# Patient Record
Sex: Female | Born: 1950 | Race: White | Hispanic: No | Marital: Single | Smoking: Never smoker
Health system: Southern US, Community
[De-identification: ages and names within clinical notes are randomized; demographics above are authoritative.]

## PROBLEM LIST (undated history)

## (undated) DIAGNOSIS — R569 Unspecified convulsions: Secondary | ICD-10-CM

## (undated) DIAGNOSIS — F09 Unspecified mental disorder due to known physiological condition: Secondary | ICD-10-CM

## (undated) DIAGNOSIS — Z803 Family history of malignant neoplasm of breast: Secondary | ICD-10-CM

## (undated) DIAGNOSIS — G039 Meningitis, unspecified: Secondary | ICD-10-CM

## (undated) DIAGNOSIS — E669 Obesity, unspecified: Secondary | ICD-10-CM

## (undated) DIAGNOSIS — E7800 Pure hypercholesterolemia, unspecified: Secondary | ICD-10-CM

## (undated) DIAGNOSIS — C50919 Malignant neoplasm of unspecified site of unspecified female breast: Secondary | ICD-10-CM

## (undated) DIAGNOSIS — Z9221 Personal history of antineoplastic chemotherapy: Secondary | ICD-10-CM

## (undated) DIAGNOSIS — I10 Essential (primary) hypertension: Secondary | ICD-10-CM

## (undated) DIAGNOSIS — E119 Type 2 diabetes mellitus without complications: Secondary | ICD-10-CM

## (undated) DIAGNOSIS — N3281 Overactive bladder: Secondary | ICD-10-CM

## (undated) DIAGNOSIS — Z923 Personal history of irradiation: Secondary | ICD-10-CM

## (undated) HISTORY — DX: Type 2 diabetes mellitus without complications: E11.9

## (undated) HISTORY — PX: CHOLECYSTECTOMY: SHX55

## (undated) HISTORY — DX: Unspecified convulsions: R56.9

## (undated) HISTORY — DX: Family history of malignant neoplasm of breast: Z80.3

## (undated) HISTORY — DX: Malignant neoplasm of unspecified site of unspecified female breast: C50.919

## (undated) HISTORY — PX: ABDOMINAL HYSTERECTOMY: SHX81

## (undated) HISTORY — DX: Pure hypercholesterolemia, unspecified: E78.00

## (undated) HISTORY — PX: BREAST BIOPSY: SHX20

## (undated) HISTORY — PX: BUNIONECTOMY: SHX129

---

## 2005-11-23 ENCOUNTER — Ambulatory Visit: Payer: Self-pay | Admitting: Gastroenterology

## 2006-09-16 ENCOUNTER — Ambulatory Visit: Payer: Self-pay | Admitting: Surgery

## 2006-09-23 ENCOUNTER — Ambulatory Visit: Payer: Self-pay | Admitting: Surgery

## 2007-12-29 ENCOUNTER — Ambulatory Visit: Payer: Self-pay | Admitting: Family Medicine

## 2008-03-05 ENCOUNTER — Ambulatory Visit: Payer: Self-pay | Admitting: Family Medicine

## 2008-03-19 ENCOUNTER — Ambulatory Visit: Payer: Self-pay | Admitting: Family Medicine

## 2008-04-19 ENCOUNTER — Ambulatory Visit: Payer: Self-pay | Admitting: Family Medicine

## 2008-05-19 ENCOUNTER — Ambulatory Visit: Payer: Self-pay | Admitting: Family Medicine

## 2009-03-15 ENCOUNTER — Ambulatory Visit: Payer: Self-pay | Admitting: Family Medicine

## 2010-03-17 ENCOUNTER — Ambulatory Visit: Payer: Self-pay | Admitting: Family Medicine

## 2010-11-16 ENCOUNTER — Emergency Department: Payer: Self-pay | Admitting: Internal Medicine

## 2011-04-21 ENCOUNTER — Ambulatory Visit: Payer: Self-pay | Admitting: Internal Medicine

## 2011-12-22 ENCOUNTER — Emergency Department: Payer: Self-pay | Admitting: Emergency Medicine

## 2011-12-22 LAB — CBC
HCT: 38.4 % (ref 35.0–47.0)
HGB: 12.8 g/dL (ref 12.0–16.0)
MCH: 30.3 pg (ref 26.0–34.0)
MCHC: 33.5 g/dL (ref 32.0–36.0)
MCV: 91 fL (ref 80–100)
RBC: 4.24 10*6/uL (ref 3.80–5.20)
RDW: 12.8 % (ref 11.5–14.5)

## 2011-12-22 LAB — COMPREHENSIVE METABOLIC PANEL
Albumin: 3.8 g/dL (ref 3.4–5.0)
Alkaline Phosphatase: 83 U/L (ref 50–136)
BUN: 18 mg/dL (ref 7–18)
Bilirubin,Total: 0.2 mg/dL (ref 0.2–1.0)
Calcium, Total: 9.1 mg/dL (ref 8.5–10.1)
Co2: 29 mmol/L (ref 21–32)
Creatinine: 0.81 mg/dL (ref 0.60–1.30)
Glucose: 100 mg/dL — ABNORMAL HIGH (ref 65–99)
Osmolality: 280 (ref 275–301)
Total Protein: 7.6 g/dL (ref 6.4–8.2)

## 2011-12-22 LAB — CK TOTAL AND CKMB (NOT AT ARMC)
CK, Total: 203 U/L (ref 21–215)
CK-MB: 2.7 ng/mL (ref 0.5–3.6)

## 2012-07-29 ENCOUNTER — Ambulatory Visit: Payer: Self-pay | Admitting: Internal Medicine

## 2012-10-03 ENCOUNTER — Encounter: Payer: Self-pay | Admitting: Nurse Practitioner

## 2012-10-04 ENCOUNTER — Encounter: Payer: Self-pay | Admitting: Nurse Practitioner

## 2012-10-04 ENCOUNTER — Ambulatory Visit (INDEPENDENT_AMBULATORY_CARE_PROVIDER_SITE_OTHER): Payer: Medicare Other | Admitting: Nurse Practitioner

## 2012-10-04 VITALS — BP 137/88 | HR 93 | Temp 99.7°F | Wt 203.0 lb

## 2012-10-04 DIAGNOSIS — G40309 Generalized idiopathic epilepsy and epileptic syndromes, not intractable, without status epilepticus: Secondary | ICD-10-CM

## 2012-10-04 DIAGNOSIS — Z79899 Other long term (current) drug therapy: Secondary | ICD-10-CM

## 2012-10-04 DIAGNOSIS — G40909 Epilepsy, unspecified, not intractable, without status epilepticus: Secondary | ICD-10-CM

## 2012-10-04 MED ORDER — CARBAMAZEPINE 200 MG PO TABS: 200.0000 mg | ORAL_TABLET | Freq: Three times a day (TID) | ORAL | Status: DC

## 2012-10-04 NOTE — Patient Instructions (Addendum)
Per group home instruction sheet

## 2012-10-04 NOTE — Progress Notes (Signed)
[facility]  PATIENT: Tamara Mejia DOB: December 11, 1950   REASON FOR VISIT: Followup for seizure disorder   HISTORY OF PRESENT ILLNESS: Tamara Mejia  a 62 year old, right handed, Caucasian female who is a resident in a group home,  follows up for seizure disorder.   She has past medical history of epilepsy. 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 She is independent in her ADL's but needs supervision and direction. She has generalized tonic-clonic seizures. She sometimes has an aura. She had breakthrough seizures approximately 11 years ago when she missed taking some of her Tegretol during a vacation with her family.  There was no recurrent seizure as long as she is taking her carbamazepine 200mg  tid.  She has had an EEG which showed frequent appearance of short lasting bilateral frontal predominant semirhythmic delta activity with large amplitude, indicating bilateral cerebral dysfunction, mainly frontal regions. There is no evidence of epileptiform discharge.  The EEG findings can due to mental retardation or metabolic toxic reasons.   UPDATE" 10/04/12: No seizure activity in many years. No behavior issues, lives in group home. No new neurologic complaints. She is with c/g today.    Review of Systems  Out of a complete 14 system review, the patient complains of only the following symptoms, and all other reviewed systems are negative.    REVIEW OF SYSTEMS: Full 14 system review of systems performed and notable only for:  Constitutional: N/A  Cardiovascular: N/A  Ear/Nose/Throat: N/A  Skin: N/A  Eyes: N/A  Respiratory: N/A  Gastroitestinal: N/A  Hematology/Lymphatic: N/A  Endocrine: N/A Musculoskeletal:N/A  Allergy/Immunology: N/A  Neurological: N/A Psychiatric: N/A   ALLERGIES: No Known Allergies  HOME MEDICATIONS: Outpatient Prescriptions Prior to Visit  Medication Sig Dispense Refill  . Calcium  Carbonate-Vitamin D (OS-CAL 500 + D PO) Take by mouth 2 (two) times daily.      . carbamazepine (TEGRETOL) 200 MG tablet Take 200 mg by mouth 3 (three) times daily.      . Cholecalciferol (VITAMIN D-3) 1000 UNITS CAPS Take 1,000 Units by mouth 2 (two) times daily.      . fenofibrate 160 MG tablet Take 160 mg by mouth daily.      Marland Kitchen lisinopril-hydrochlorothiazide (PRINZIDE,ZESTORETIC) 10-12.5 MG per tablet Take 1 tablet by mouth daily.      . Omega-3 Fatty Acids (PA FISH OIL PO) Take by mouth.      Marland Kitchen omeprazole (PRILOSEC) 20 MG capsule Take 20 mg by mouth daily.      . ranitidine (ZANTAC) 150 MG tablet Take 150 mg by mouth daily.      . simvastatin (ZOCOR) 20 MG tablet Take 20 mg by mouth at bedtime.      . metFORMIN (GLUCOPHAGE) 500 MG tablet Take 500 mg by mouth 2 (two) times daily with a meal.      . tolterodine (DETROL LA) 4 MG 24 hr capsule Take 4 mg by mouth daily.       No facility-administered medications prior to visit.    PAST MEDICAL HISTORY: Past Medical History  Diagnosis Date  . Diabetes   . Seizure   . High cholesterol     PAST SURGICAL HISTORY: Past Surgical History  Procedure Laterality Date  . Cholecystectomy      FAMILY HISTORY: Family History  Problem Relation Age of Onset  . Diabetes Father   . Heart disease Brother   . Heart disease Father     SOCIAL  HISTORY: History   Social History  . Marital Status: Single    Spouse Name: N/A    Number of Children: N/A  . Years of Education: N/A   Occupational History  . Not on file.   Social History Main Topics  . Smoking status: Never Smoker   . Smokeless tobacco: Never Used  . Alcohol Use: No  . Drug Use: No  . Sexual Activity: Not on file   Other Topics Concern  . Not on file   Social History Narrative   Patient works for cutting [employer]   Patient has her adult education certificate   Patient drinks about 2 cups of coffee daily.            PHYSICAL EXAM  Filed Vitals:   10/04/12 0937    BP: 137/88  Pulse: 93  Temp: 99.7 F (37.6 C)  TempSrc: Oral  Weight: 203 lb (92.08 kg)   Body mass index is 35.39 kg/(m^2).  Generalized: Well developed, obese female in no acute distress  Head: normocephalic and atraumatic,. Oropharynx benign  Neck: Supple, no carotid bruits  Cardiac: Regular rate rhythm, no murmur   Neurological examination   Mentation: Alert oriented to time, place, history taking. Follows all commands.  Cranial nerve II-XII: Pupils were equal round reactive to light, extraocular movements were full, visual field were full on confrontational test. Facial sensation and strength were normal. hearing was intact to finger rubbing bilaterally. Uvula tongue midline. head turning and shoulder shrug and were normal and symmetric.Tongue protrusion into cheek strength was normal. Motor: normal bulk and tone, full strength in the BUE, BLE, fine finger movements normal, no pronator drift. No focal weakness Sensory: normal and symmetric to light touch, pinprick, and  vibration  Coordination: finger-nose-finger, heel-to-shin bilaterally, no dysmetria Reflexes: Brachioradialis 2/2, biceps 2/2, triceps 2/2, patellar 2/2, Achilles 2/2, plantar responses were flexor bilaterally. Gait and Station: Rising up from seated position without assistance, normal stance,  moderate stride, good arm swing, smooth turning, able to perform tiptoe, and heel walking without difficulty. Tandem gait stable  DIAGNOSTIC DATA (LABS, IMAGING, TESTING)   none to review  ASSESSMENT AND PLAN  62 y.o. year old female  has a past medical history of Diabetes; Seizure; and High cholesterol. here to followup for seizure disorder. No seizures in many years. Currently on Tegretol 3 times a day without side effects.  Renew meds Will check CBC carbamazepine, and CMP today Followup yearly when necessary Nilda Riggs, [facility], [facility], APRN  [facility] [address], [address] [place],  [place] [address] [phone]

## 2012-10-05 LAB — COMPREHENSIVE METABOLIC PANEL
ALT: 19 IU/L (ref 0–32)
BUN: 14 mg/dL (ref 8–27)
Calcium: 9.4 mg/dL (ref 8.6–10.2)
Chloride: 101 mmol/L (ref 96–108)
GFR calc non Af Amer: 98 mL/min/{1.73_m2} (ref >59)
Glucose: 96 mg/dL (ref 65–99)
Potassium: 3.9 mmol/L (ref 3.5–5.2)
Total Protein: 6.8 g/dL (ref 6.0–8.5)

## 2012-10-05 LAB — CBC WITH DIFFERENTIAL/PLATELET
Basophils Absolute: 0 10*3/uL (ref 0.0–0.2)
HCT: 36.2 % (ref 34.0–46.6)
Lymphocytes Absolute: 1.7 10*3/uL (ref 0.7–3.1)
MCHC: 34.3 g/dL (ref 31.5–35.7)
Monocytes Absolute: 0.5 10*3/uL (ref 0.1–0.9)
Monocytes: 11 %
Neutrophils Relative %: 51 %
RDW: 13.4 % (ref 12.3–15.4)

## 2012-10-05 LAB — CARBAMAZEPINE LEVEL, TOTAL: Carbamazepine Lvl: 7 ug/mL (ref 4.0–12.0)

## 2012-10-05 NOTE — Progress Notes (Signed)
Quick Note:  I called and spoke with [facility] (group [facility]), Clarice Pole was given the results and will relay information. She asked that information be given to them, relating to appts and such as pt will not always relay. Coordinator to call about this. Would send to MR, for ROI. ______

## 2013-07-01 DIAGNOSIS — E119 Type 2 diabetes mellitus without complications: Secondary | ICD-10-CM | POA: Insufficient documentation

## 2013-07-01 DIAGNOSIS — E785 Hyperlipidemia, unspecified: Secondary | ICD-10-CM | POA: Insufficient documentation

## 2013-07-01 DIAGNOSIS — N3281 Overactive bladder: Secondary | ICD-10-CM | POA: Insufficient documentation

## 2013-07-01 DIAGNOSIS — I10 Essential (primary) hypertension: Secondary | ICD-10-CM | POA: Insufficient documentation

## 2013-07-01 DIAGNOSIS — R569 Unspecified convulsions: Secondary | ICD-10-CM | POA: Insufficient documentation

## 2013-08-07 ENCOUNTER — Ambulatory Visit: Payer: Self-pay | Admitting: Internal Medicine

## 2013-09-28 ENCOUNTER — Other Ambulatory Visit: Payer: Self-pay

## 2013-09-28 MED ORDER — CARBAMAZEPINE 200 MG PO TABS
200.0000 mg | ORAL_TABLET | Freq: Three times a day (TID) | ORAL | Status: DC
Start: 2013-09-28 — End: 2013-10-04

## 2013-10-04 ENCOUNTER — Ambulatory Visit (INDEPENDENT_AMBULATORY_CARE_PROVIDER_SITE_OTHER): Payer: Medicare Other | Admitting: Nurse Practitioner

## 2013-10-04 ENCOUNTER — Encounter: Payer: Self-pay | Admitting: Nurse Practitioner

## 2013-10-04 ENCOUNTER — Encounter (INDEPENDENT_AMBULATORY_CARE_PROVIDER_SITE_OTHER): Payer: Self-pay

## 2013-10-04 VITALS — BP 143/82 | HR 95 | Ht 63.5 in | Wt 210.0 lb

## 2013-10-04 DIAGNOSIS — G40309 Generalized idiopathic epilepsy and epileptic syndromes, not intractable, without status epilepticus: Secondary | ICD-10-CM

## 2013-10-04 DIAGNOSIS — Z79899 Other long term (current) drug therapy: Secondary | ICD-10-CM

## 2013-10-04 DIAGNOSIS — G40909 Epilepsy, unspecified, not intractable, without status epilepticus: Secondary | ICD-10-CM

## 2013-10-04 MED ORDER — CARBAMAZEPINE 200 MG PO TABS: 200.0000 mg | ORAL_TABLET | Freq: Three times a day (TID) | ORAL | Status: DC

## 2013-10-04 NOTE — Progress Notes (Signed)
[facility]  PATIENT: Vonita Moss DOB: 11-Feb-1950   REASON FOR VISIT: follow up for seizure diosrder    HISTORY OF PRESENT ILLNESS:Ms Mcgourty a 63 year old, right handed, Caucasian female who is a resident in a group home, follows up for seizure disorder. She has past medical history of epilepsy. 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 She is independent in her ADL's but needs supervision and direction. She has generalized tonic-clonic seizures. She sometimes has an aura. She had breakthrough seizures approximately 12 years ago when she missed taking some of her Tegretol during a vacation with her family. There was no recurrent seizure as long as she is taking her carbamazepine 200mg  tid.  She has had an EEG which showed frequent appearance of short lasting bilateral frontal predominant semirhythmic delta activity with large amplitude, indicating bilateral cerebral dysfunction, mainly frontal regions. There is no evidence of epileptiform discharge. The EEG findings can be due to mental retardation or metabolic toxic reasons.  UPDATE" 10/04/13: No seizure activity in 12 years. No behavior issues, lives in group home. No new neurologic complaints. She is with c/g today. She returns for reevaluation. She does go to a day program.      REVIEW OF SYSTEMS: Full 14 system review of systems performed and notable only for those listed, all others are neg:  Constitutional: N/A  Cardiovascular: N/A  Ear/Nose/Throat: N/A  Skin: N/A  Eyes: N/A  Respiratory: N/A  Gastroitestinal: N/A  Hematology/Lymphatic: N/A  Endocrine: N/A Musculoskeletal:N/A  Allergy/Immunology: N/A  Neurological: N/A Psychiatric: N/A Sleep : NA   ALLERGIES: No Known Allergies  HOME MEDICATIONS: Outpatient Prescriptions Prior to Visit  Medication Sig Dispense Refill  . aspirin 81 MG EC tablet Take 81 mg by mouth daily. Swallow whole.      .  carbamazepine (TEGRETOL) 200 MG tablet Take 1 tablet (200 mg total) by mouth 3 (three) times daily.  90 tablet  0  . Cholecalciferol (VITAMIN D-3) 1000 UNITS CAPS Take 1,000 Units by mouth 2 (two) times daily.      . fenofibrate 160 MG tablet Take 160 mg by mouth daily.      Marland Kitchen lisinopril-hydrochlorothiazide (PRINZIDE,ZESTORETIC) 10-12.5 MG per tablet Take 1 tablet by mouth daily.      . metFORMIN (GLUCOPHAGE) 500 MG tablet Take 500 mg by mouth 2 (two) times daily with a meal.      . omeprazole (PRILOSEC) 20 MG capsule Take 20 mg by mouth daily.      . ranitidine (ZANTAC) 150 MG tablet Take 150 mg by mouth daily.      . simvastatin (ZOCOR) 20 MG tablet Take 20 mg by mouth at bedtime.      . Calcium Carbonate-Vitamin D (OS-CAL 500 + D PO) Take by mouth 2 (two) times daily.      . Omega-3 Fatty Acids (PA FISH OIL PO) Take by mouth.       No facility-administered medications prior to visit.    PAST MEDICAL HISTORY: Past Medical History  Diagnosis Date  . Diabetes   . Seizure   . High cholesterol     PAST SURGICAL HISTORY: Past Surgical History  Procedure Laterality Date  . Cholecystectomy      FAMILY HISTORY: Family History  Problem Relation Age of Onset  . Diabetes Father   . Heart disease Brother   . Heart disease Father     SOCIAL HISTORY: History   Social History  .  Marital Status: Single    Spouse Name: N/A    Number of Children: N/A  . Years of Education: N/A   Occupational History  . Not on file.   Social History Main Topics  . Smoking status: Never Smoker   . Smokeless tobacco: Never Used  . Alcohol Use: No  . Drug Use: No  . Sexual Activity: Not on file   Other Topics Concern  . Not on file   Social History Narrative   Patient works for cutting board   Patient has her adult education certificate   Patient drinks about 2 cups of coffee daily.            PHYSICAL EXAM  Filed Vitals:   10/04/13 0855  BP: 143/82  Pulse: 95  Height: 5' 3.5"  (1.613 m)  Weight: 210 lb (95.255 kg)   Body mass index is 36.61 kg/(m^2). Generalized: Well developed, obese female in no acute distress  Head: normocephalic and atraumatic,. Oropharynx benign  Neck: Supple, no carotid bruits  Neurological examination  Mentation: Alert oriented to time, place, history taking. Follows all commands.  Cranial nerve II-XII: Pupils were equal round reactive to light, extraocular movements were full, visual field were full on confrontational test. Facial sensation and strength were normal. hearing was intact to finger rubbing bilaterally. Uvula tongue midline. head turning and shoulder shrug and were normal and symmetric.Tongue protrusion into cheek strength was normal.  Motor: normal bulk and tone, full strength in the BUE, BLE, fine finger movements normal, no pronator drift. No focal weakness  Coordination: finger-nose-finger, heel-to-shin bilaterally, no dysmetria  Reflexes: Brachioradialis 2/2, biceps 2/2, triceps 2/2, patellar 2/2, Achilles 2/2, plantar responses were flexor bilaterally.  Gait and Station: Rising up from seated position without assistance, normal stance, moderate stride, good arm swing, smooth turning, able to perform tiptoe, and heel walking without difficulty. Tandem gait stable     DIAGNOSTIC DATA (LABS, IMAGING, TESTING) - I reviewed patient records, labs, notes, testing and imaging myself where available.  Lab Results  Component Value Date   WBC 4.9 10/04/2012   HGB 12.4 10/04/2012   HCT 36.2 10/04/2012   MCV 89 10/04/2012      Component Value Date/Time   NA 141 10/04/2012 1011   K 3.9 10/04/2012 1011   CL 101 10/04/2012 1011   CO2 26 10/04/2012 1011   GLUCOSE 96 10/04/2012 1011   BUN 14 10/04/2012 1011   CREATININE 0.63 10/04/2012 1011   CALCIUM 9.4 10/04/2012 1011   PROT 6.8 10/04/2012 1011   AST 19 10/04/2012 1011   ALT 19 10/04/2012 1011   ALKPHOS 68 10/04/2012 1011   BILITOT 0.1 10/04/2012 1011   GFRNONAA 98 10/04/2012 1011   GFRAA  113 10/04/2012 1011    ASSESSMENT AND PLAN  63 y.o. year old female  has a past medical history of Diabetes; Seizure; and High cholesterol. here to followup for seizure disorder. Last seizure 12 years ago  Continue carbamazepine 200 mg 3 times daily will refill Check CBC, CMP, and carbamazepine level today Call for any seizure activity Followup yearly and when necessary Dennie Bible, [facility], [facility], [facility] [address] [place], [place] [address] [phone]

## 2013-10-04 NOTE — Patient Instructions (Signed)
Per group home sheet 

## 2013-10-05 LAB — CBC WITH DIFFERENTIAL
BASOS ABS: 0 10*3/uL (ref 0.0–0.2)
Basos: 0 %
EOS ABS: 0.1 10*3/uL (ref 0.0–0.4)
Eos: 2 %
HEMATOCRIT: 37.7 % (ref 34.0–46.6)
Hemoglobin: 13 g/dL (ref 11.1–15.9)
LYMPHS ABS: 1.6 10*3/uL (ref 0.7–3.1)
Lymphs: 32 %
MCH: 30.4 pg (ref 26.6–33.0)
MCHC: 34.5 g/dL (ref 31.5–35.7)
MCV: 88 fL (ref 79–97)
MONOS ABS: 0.6 10*3/uL (ref 0.1–0.9)
Monocytes: 11 %
NEUTROS ABS: 2.8 10*3/uL (ref 1.4–7.0)
Neutrophils Relative %: 55 %
PLATELETS: 260 10*3/uL (ref 150–379)
RBC: 4.27 x10E6/uL (ref 3.77–5.28)
RDW: 12.9 % (ref 12.3–15.4)
WBC: 5.1 10*3/uL (ref 3.4–10.8)

## 2013-10-05 LAB — COMPREHENSIVE METABOLIC PANEL
A/G RATIO: 1.7 (ref 1.1–2.5)
ALT: 22 IU/L (ref 0–32)
AST: 22 IU/L (ref 0–40)
Albumin: 4.3 g/dL (ref 3.6–4.8)
Alkaline Phosphatase: 68 IU/L (ref 39–117)
BUN/Creatinine Ratio: 21 (ref 11–26)
BUN: 14 mg/dL (ref 8–27)
CALCIUM: 9.6 mg/dL (ref 8.7–10.3)
CHLORIDE: 100 mmol/L (ref 96–108)
CO2: 26 mmol/L (ref 18–29)
Creatinine, Ser: 0.68 mg/dL (ref 0.57–1.00)
GFR calc Af Amer: 109 mL/min/{1.73_m2} (ref >59)
GFR, EST NON AFRICAN AMERICAN: 95 mL/min/{1.73_m2} (ref >59)
GLUCOSE: 135 mg/dL — AB (ref 65–99)
Globulin, Total: 2.5 g/dL (ref 1.5–4.5)
POTASSIUM: 3.9 mmol/L (ref 3.5–5.2)
SODIUM: 138 mmol/L (ref 134–144)
TOTAL PROTEIN: 6.8 g/dL (ref 6.0–8.5)
Total Bilirubin: <0.2 mg/dL (ref 0.0–1.2)

## 2013-10-05 LAB — CARBAMAZEPINE LEVEL, TOTAL: CARBAMAZEPINE LVL: 7 ug/mL (ref 4.0–12.0)

## 2013-10-06 ENCOUNTER — Telehealth: Payer: Self-pay | Admitting: *Deleted

## 2013-10-06 NOTE — Telephone Encounter (Signed)
Spoke with stafft @ [facility]) Notified normal labs for the patient.

## 2013-12-06 ENCOUNTER — Encounter: Payer: Self-pay | Admitting: Neurology

## 2013-12-12 ENCOUNTER — Encounter: Payer: Self-pay | Admitting: Neurology

## 2014-01-16 DIAGNOSIS — Z6835 Body mass index (BMI) 35.0-35.9, adult: Secondary | ICD-10-CM

## 2014-03-26 ENCOUNTER — Telehealth: Payer: Self-pay | Admitting: Neurology

## 2014-03-26 NOTE — Telephone Encounter (Signed)
According to Medicaid, this drug is covered.  I called the pharmacy.  Spoke with Estill Bamberg.  She said there was an issue with Medicaid in Jan, but they asked the patient to contact case worker.  Says it must have been resolved, because they show the last paid claim was on 02/24.    I called back.  Relayed info provided by pharmacy.  They verbalized understanding and will call us back if anything further is needed.

## 2014-03-26 NOTE — Telephone Encounter (Signed)
Pt's caretaker calling stating that medicaid will no longer cover carbamazepine (TEGRETOL) 200 MG tablet.  She cannot afford it.  She needs to know if there is something else the pt can take or what needs to be done.  Please call and advise. When calling ask for Roderic Ovens or Joycelyn Schmid.

## 2014-05-04 ENCOUNTER — Emergency Department
Admit: 2014-05-04 | Disposition: A | Discharge status: Home / Self Care | Payer: Self-pay | Admitting: Emergency Medicine

## 2014-07-18 DIAGNOSIS — Z0000 Encounter for general adult medical examination without abnormal findings: Secondary | ICD-10-CM | POA: Insufficient documentation

## 2014-07-20 ENCOUNTER — Other Ambulatory Visit: Payer: Self-pay | Admitting: Internal Medicine

## 2014-07-20 DIAGNOSIS — Z1231 Encounter for screening mammogram for malignant neoplasm of breast: Secondary | ICD-10-CM

## 2014-08-09 ENCOUNTER — Ambulatory Visit
Admission: RE | Admit: 2014-08-09 | Discharge: 2014-08-09 | Disposition: A | Discharge status: Home / Self Care | Payer: Medicare Other | Source: Ambulatory Visit | Attending: Internal Medicine | Admitting: Internal Medicine

## 2014-08-09 ENCOUNTER — Other Ambulatory Visit: Payer: Self-pay | Admitting: Internal Medicine

## 2014-08-09 DIAGNOSIS — Z1231 Encounter for screening mammogram for malignant neoplasm of breast: Secondary | ICD-10-CM | POA: Insufficient documentation

## 2014-08-14 ENCOUNTER — Other Ambulatory Visit: Payer: Self-pay | Admitting: Internal Medicine

## 2014-08-14 DIAGNOSIS — N6310 Unspecified lump in the right breast, unspecified quadrant: Secondary | ICD-10-CM

## 2014-08-14 DIAGNOSIS — N6489 Other specified disorders of breast: Secondary | ICD-10-CM

## 2014-08-16 ENCOUNTER — Ambulatory Visit: Payer: Medicare Other

## 2014-08-16 ENCOUNTER — Ambulatory Visit
Admission: RE | Admit: 2014-08-16 | Discharge: 2014-08-16 | Disposition: A | Discharge status: Home / Self Care | Payer: Medicare Other | Source: Ambulatory Visit | Attending: Internal Medicine | Admitting: Internal Medicine

## 2014-08-16 DIAGNOSIS — N6310 Unspecified lump in the right breast, unspecified quadrant: Secondary | ICD-10-CM

## 2014-08-16 DIAGNOSIS — N6489 Other specified disorders of breast: Secondary | ICD-10-CM | POA: Diagnosis present

## 2014-10-08 ENCOUNTER — Ambulatory Visit (INDEPENDENT_AMBULATORY_CARE_PROVIDER_SITE_OTHER): Payer: Medicare Other | Admitting: Neurology

## 2014-10-08 ENCOUNTER — Telehealth: Payer: Self-pay | Admitting: *Deleted

## 2014-10-08 ENCOUNTER — Encounter: Payer: Self-pay | Admitting: Neurology

## 2014-10-08 VITALS — BP 138/78 | HR 60 | Ht 63.5 in | Wt 202.0 lb

## 2014-10-08 DIAGNOSIS — G40309 Generalized idiopathic epilepsy and epileptic syndromes, not intractable, without status epilepticus: Secondary | ICD-10-CM

## 2014-10-08 MED ORDER — CARBAMAZEPINE 200 MG PO TABS: 200.0000 mg | ORAL_TABLET | Freq: Three times a day (TID) | ORAL | Status: DC

## 2014-10-08 NOTE — Progress Notes (Signed)
Chief Complaint  Patient presents with  . Seizures    No new concerns.  No reported seizure activity.     [facility]  PATIENT: Tamara Mejia DOB: 1950-08-17   REASON FOR VISIT: follow up for seizure diosrder  HISTORY OF PRESENT ILLNESS:Tamara Mejia a 64 year old, right handed, Caucasian female who is a resident in a group home, follows up for seizure disorder.   She has past medical history of epilepsy. 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 She is independent in her ADL's but needs supervision and direction. She has generalized tonic-clonic seizures. She sometimes has an aura. She had breakthrough seizures approximately 12 years ago, around 2000,  when she missed taking some of her Tegretol during a vacation with her family. There was no recurrent seizure as long as she is taking her carbamazepine 200mg  tid.  She has had an EEG which showed frequent appearance of short lasting bilateral frontal predominant semirhythmic delta activity with large amplitude, indicating bilateral cerebral dysfunction, mainly frontal regions. There is no evidence of epileptiform discharge. The EEG findings can be due to mental retardation or metabolic toxic reasons.  UPDATE 10/04/13: No seizure activity in 12 years. No behavior issues, lives in group home. No new neurologic complaints. She is with c/g today. She returns for reevaluation. She does go to a day program.   UPDATE Oct 08 2014: She is accompanied by her group home staff Linna Hoff, Last clinical visit was with Hoyle Sauer in September 2015,she continued to take her Tegretol 200 mg 3 times a day, she tolerated the medication well, there was no significant side effect. There was no recurrent seizure documented.  She fell in April 2016 at an outdoor event, she was checked at [facility], no significant abnormality, no seizure reported.  "the heat did not argree with  me",Last seizure was  around 2000.  REVIEW OF SYSTEMS: Full 14 system review of systems performed and notable only for those listed, all others are neg: As above  ALLERGIES: No Known Allergies  HOME MEDICATIONS: Outpatient Prescriptions Prior to Visit  Medication Sig Dispense Refill  . aspirin 81 MG EC tablet Take 81 mg by mouth daily. Swallow whole.    . carbamazepine (TEGRETOL) 200 MG tablet Take 1 tablet (200 mg total) by mouth 3 (three) times daily. 90 tablet 11  . Cholecalciferol (VITAMIN D-3) 1000 UNITS CAPS Take 1,000 Units by mouth 2 (two) times daily.    Marland Kitchen lisinopril-hydrochlorothiazide (PRINZIDE,ZESTORETIC) 10-12.5 MG per tablet Take 1 tablet by mouth daily.    . metFORMIN (GLUCOPHAGE) 500 MG tablet Take 500 mg by mouth 2 (two) times daily with a meal.    . simvastatin (ZOCOR) 20 MG tablet Take 20 mg by mouth at bedtime.    . fenofibrate 160 MG tablet Take 160 mg by mouth daily.    Marland Kitchen omeprazole (PRILOSEC) 20 MG capsule Take 20 mg by mouth daily.    . ranitidine (ZANTAC) 150 MG tablet Take 150 mg by mouth daily.     No facility-administered medications prior to visit.    PAST MEDICAL HISTORY: Past Medical History  Diagnosis Date  . Diabetes   . Seizure   . High cholesterol     PAST SURGICAL HISTORY: Past Surgical History  Procedure Laterality Date  . Cholecystectomy    . Breast biopsy Left     neg    FAMILY HISTORY: Family History  Problem Relation Age of Onset  .  Diabetes Father   . Heart disease Father   . Heart disease Brother   . Breast cancer Cousin     SOCIAL HISTORY: Social History   Social History  . Marital Status: Single    Spouse Name: N/A  . Number of Children: N/A  . Years of Education: N/A   Occupational History  . Not on file.   Social History Main Topics  . Smoking status: Never Smoker   . Smokeless tobacco: Never Used  . Alcohol Use: No  . Drug Use: No  . Sexual Activity: Not on file   Other Topics Concern  . Not on file    Social History Narrative   Patient works for cutting board   Patient has her adult education certificate   Patient drinks about 2 cups of coffee daily.            PHYSICAL EXAM  Filed Vitals:   10/08/14 1051  BP: 138/78  Pulse: 60  Height: 5' 3.5" (1.613 m)  Weight: 202 lb (91.627 kg)   Body mass index is 35.22 kg/(m^2).  PHYSICAL EXAMNIATION:  Gen: NAD, conversant, well nourised, obese, well groomed                     Cardiovascular: Regular rate rhythm, no peripheral edema, warm, nontender. Eyes: Conjunctivae clear without exudates or hemorrhage Neck: Supple, no carotid bruise. Pulmonary: Clear to auscultation bilaterally   NEUROLOGICAL EXAM:  MENTAL STATUS: Speech:    Speech is normal; fluent and spontaneous with normal comprehension.  Cognition:   Cooperative on examination, able to remember her medication list   CRANIAL NERVES: CN II: Visual fields are full to confrontation. Pupils are round equal and briskly reactive to light. CN III, IV, VI: extraocular movement are normal. No ptosis. CN V: Facial sensation is intact to pinprick in all 3 divisions bilaterally. Corneal responses are intact.  CN VII: Face is symmetric with normal eye closure and smile. CN VIII: Hearing is normal to rubbing fingers CN IX, X: Palate elevates symmetrically. Phonation is normal. CN XI: Head turning and shoulder shrug are intact CN XII: Tongue is midline with normal movements and no atrophy.  MOTOR: There is no pronator drift of out-stretched arms. Muscle bulk and tone are normal. Muscle strength is normal.  REFLEXES: Reflexes are 2+ and symmetric at the biceps, triceps, knees, and ankles. Plantar responses are flexor.  SENSORY: Intact to light touch, pinprick, position sense, and vibration sense are intact in fingers and toes.  COORDINATION: Rapid alternating movements and fine finger movements are intact. There is no dysmetria on finger-to-nose and heel-knee-shin.     GAIT/STANCE: Posture is normal. Gait is steady with normal steps, base, arm swing, and turning. Heel and toe walking are normal. Tandem gait is normal.  Romberg is absent.      DIAGNOSTIC DATA (LABS, IMAGING, TESTING) - I reviewed patient records, labs, notes, testing and imaging myself where available.   ASSESSMENT AND PLAN  64 y.o. year old   Generalized epilepsy  Last recurrent seizure was in 2000  Doing very well taking Tegretol 200 mg 3 times a day Mild mental retardation   Marcial Pacas, M.D. Ph.D.  [facility] [place], [place] [address] Phone: [phone] Fax:      [phone]

## 2014-10-08 NOTE — Telephone Encounter (Signed)
No showed new patient appointment. 

## 2015-01-04 ENCOUNTER — Telehealth: Payer: Self-pay | Admitting: Neurology

## 2015-01-04 NOTE — Telephone Encounter (Signed)
Patient called to wish Dr. Krista Blue and all the staff at [facility] a Tamara Mejia, thinks she sees [place] next year, no follow up appointment scheduled.

## 2015-01-04 NOTE — Telephone Encounter (Signed)
Chart reviewed, last clinical visit was September 2016, last recurrent seizure was 2000, she is clinically stable, may continue refill of carbamazepine 200 mg 3 times a day by her primary care physician, she will only return to clinic for new issues

## 2015-07-15 ENCOUNTER — Other Ambulatory Visit: Payer: Self-pay | Admitting: Internal Medicine

## 2015-07-15 DIAGNOSIS — Z1231 Encounter for screening mammogram for malignant neoplasm of breast: Secondary | ICD-10-CM

## 2015-07-19 ENCOUNTER — Telehealth: Payer: Self-pay | Admitting: Gastroenterology

## 2015-07-19 NOTE — Telephone Encounter (Signed)
Colonoscopy. She resides at [facility].

## 2015-07-30 NOTE — Telephone Encounter (Signed)
Called both of patients phone numbers and left a message on her mobile.

## 2015-08-02 NOTE — Telephone Encounter (Signed)
Sent patient a notification letter to their address on file

## 2015-08-05 ENCOUNTER — Telehealth: Payer: Self-pay

## 2015-08-05 ENCOUNTER — Other Ambulatory Visit: Payer: Self-pay

## 2015-08-05 NOTE — Telephone Encounter (Signed)
Gastroenterology Pre-Procedure Review  Request Date: 08/27/2015  Requesting Physician: Dr. Ouida Sills    PATIENT REVIEW QUESTIONS: The patient responded to the following health history questions as indicated:    1. Are you having any GI issues? no 2. Do you have a personal history of Polyps? no 3. Do you have a family history of Colon Cancer or Polyps? no 4. 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 Joint replacements in the past 12 months?no 6. Major health problems in the past 3 months?no 7. Any artificial heart valves, MVP, or defibrillator?no    MEDICATIONS & ALLERGIES:    Patient reports the following regarding taking any anticoagulation/antiplatelet therapy:   Plavix, Coumadin, Eliquis, Xarelto, Lovenox, Pradaxa, Brilinta, or Effient? no Aspirin? yes (Blood pressure)  Patient confirms/reports the following medications:  Current Outpatient Prescriptions  Medication Sig Dispense Refill  . amLODipine (NORVASC) 5 MG tablet Take 5 mg by mouth daily.    Marland Kitchen aspirin 81 MG EC tablet Take 81 mg by mouth daily. Swallow whole.    . carbamazepine (TEGRETOL) 200 MG tablet Take 1 tablet (200 mg total) by mouth 3 (three) times daily. 90 tablet 11  . Cholecalciferol (VITAMIN D-3) 1000 UNITS CAPS Take 1,000 Units by mouth 2 (two) times daily.    . metFORMIN (GLUCOPHAGE) 500 MG tablet Take 500 mg by mouth 2 (two) times daily with a meal.    . simvastatin (ZOCOR) 20 MG tablet Take 20 mg by mouth at bedtime.    Marland Kitchen Dextromethorphan-Guaifenesin (TUSSIN DM PO) Take by mouth. Reported on 08/05/2015     No current facility-administered medications for this visit.    Patient confirms/reports the following allergies:  No Known Allergies  No orders of the defined types were placed in this encounter.    AUTHORIZATION INFORMATION Primary Insurance: 1D#: Group #:  Secondary Insurance: 1D#: Group #:  SCHEDULE INFORMATION: Date: 08/27/2015  Time: Location: [facility]

## 2015-08-05 NOTE — Telephone Encounter (Signed)
error 

## 2015-08-05 NOTE — Telephone Encounter (Signed)
Colonoscopy scheduled 08/27/2015 [facility] Colonoscopy screening [id number] Please pre-cert

## 2015-08-12 ENCOUNTER — Ambulatory Visit
Admission: RE | Admit: 2015-08-12 | Discharge: 2015-08-12 | Disposition: A | Discharge status: Home / Self Care | Payer: Medicare Other | Source: Ambulatory Visit | Attending: Internal Medicine | Admitting: Internal Medicine

## 2015-08-12 ENCOUNTER — Other Ambulatory Visit: Payer: Self-pay

## 2015-08-12 DIAGNOSIS — Z1231 Encounter for screening mammogram for malignant neoplasm of breast: Secondary | ICD-10-CM | POA: Insufficient documentation

## 2015-08-12 NOTE — Telephone Encounter (Signed)
Please call [facility], regarding an appointment for a colonoscopy

## 2015-08-26 ENCOUNTER — Encounter: Payer: Self-pay | Admitting: *Deleted

## 2015-08-27 ENCOUNTER — Encounter: Payer: Self-pay | Admitting: Anesthesiology

## 2015-08-27 ENCOUNTER — Ambulatory Visit: Payer: Medicare Other | Admitting: Anesthesiology

## 2015-08-27 ENCOUNTER — Encounter
Admission: RE | Disposition: A | Discharge status: Home / Self Care | Payer: Self-pay | Source: Ambulatory Visit | Attending: Gastroenterology

## 2015-08-27 ENCOUNTER — Ambulatory Visit
Admission: RE | Admit: 2015-08-27 | Discharge: 2015-08-27 | Disposition: A | Discharge status: Home / Self Care | Payer: Medicare Other | Source: Ambulatory Visit | Attending: Gastroenterology | Admitting: Gastroenterology

## 2015-08-27 DIAGNOSIS — K640 First degree hemorrhoids: Secondary | ICD-10-CM | POA: Insufficient documentation

## 2015-08-27 DIAGNOSIS — Z7982 Long term (current) use of aspirin: Secondary | ICD-10-CM | POA: Diagnosis not present

## 2015-08-27 DIAGNOSIS — Z1211 Encounter for screening for malignant neoplasm of colon: Secondary | ICD-10-CM

## 2015-08-27 DIAGNOSIS — Z79899 Other long term (current) drug therapy: Secondary | ICD-10-CM | POA: Insufficient documentation

## 2015-08-27 DIAGNOSIS — K621 Rectal polyp: Secondary | ICD-10-CM | POA: Insufficient documentation

## 2015-08-27 DIAGNOSIS — E7800 Pure hypercholesterolemia, unspecified: Secondary | ICD-10-CM | POA: Diagnosis not present

## 2015-08-27 DIAGNOSIS — Z7984 Long term (current) use of oral hypoglycemic drugs: Secondary | ICD-10-CM | POA: Diagnosis not present

## 2015-08-27 DIAGNOSIS — E119 Type 2 diabetes mellitus without complications: Secondary | ICD-10-CM | POA: Insufficient documentation

## 2015-08-27 DIAGNOSIS — G40909 Epilepsy, unspecified, not intractable, without status epilepticus: Secondary | ICD-10-CM | POA: Diagnosis not present

## 2015-08-27 HISTORY — PX: COLONOSCOPY WITH PROPOFOL: SHX5780

## 2015-08-27 LAB — GLUCOSE, CAPILLARY: Glucose-Capillary: 111 mg/dL — ABNORMAL HIGH (ref 65–99)

## 2015-08-27 SURGERY — COLONOSCOPY WITH PROPOFOL
Anesthesia: General

## 2015-08-27 MED ORDER — PROPOFOL 500 MG/50ML IV EMUL
INTRAVENOUS | Status: DC | PRN
  Administered 2015-08-27: 160 ug/kg/min via INTRAVENOUS

## 2015-08-27 MED ORDER — PROPOFOL 10 MG/ML IV BOLUS
INTRAVENOUS | Status: DC | PRN
  Administered 2015-08-27: 100 mg via INTRAVENOUS

## 2015-08-27 MED ORDER — FENTANYL CITRATE (PF) 100 MCG/2ML IJ SOLN
INTRAMUSCULAR | Status: DC | PRN
  Administered 2015-08-27: 50 ug via INTRAVENOUS

## 2015-08-27 MED ORDER — SODIUM CHLORIDE 0.9 % IV SOLN
INTRAVENOUS | Status: DC
  Administered 2015-08-27 (×2): via INTRAVENOUS

## 2015-08-27 MED ORDER — LIDOCAINE 2% (20 MG/ML) 5 ML SYRINGE
INTRAMUSCULAR | Status: DC | PRN
  Administered 2015-08-27: 40 mg via INTRAVENOUS

## 2015-08-27 MED ORDER — MIDAZOLAM HCL 5 MG/5ML IJ SOLN
INTRAMUSCULAR | Status: DC | PRN
  Administered 2015-08-27: 1 mg via INTRAVENOUS

## 2015-08-27 MED ORDER — PHENYLEPHRINE HCL 10 MG/ML IJ SOLN
INTRAMUSCULAR | Status: DC | PRN
  Administered 2015-08-27: 100 ug via INTRAVENOUS

## 2015-08-27 NOTE — Anesthesia Preprocedure Evaluation (Signed)
Anesthesia Evaluation  Patient identified by MRN, date of birth, ID band Patient awake    Reviewed: Allergy & Precautions, H&P , NPO status , Patient's Chart, lab work & pertinent test results  Airway Mallampati: III  TM Distance: >3 FB Neck ROM: limited    Dental  (+) Poor Dentition, Chipped, Missing   Pulmonary neg pulmonary ROS, neg shortness of breath,    Pulmonary exam normal breath sounds clear to auscultation       Cardiovascular Exercise Tolerance: Poor (-) angina(-) Past MI Normal cardiovascular exam Rhythm:regular Rate:Normal     Neuro/Psych Seizures -,   Neuromuscular disease negative psych ROS   GI/Hepatic negative GI ROS, Neg liver ROS,   Endo/Other  diabetes, Type 2, Oral Hypoglycemic Agents  Renal/GU negative Renal ROS  negative genitourinary   Musculoskeletal   Abdominal   Peds  Hematology negative hematology ROS (+)   Anesthesia Other Findings Past Medical History: No date: Diabetes ([place]) No date: High cholesterol No date: Seizure [facility])  Past Surgical History: No date: BREAST BIOPSY Right     Comment: neg No date: CHOLECYSTECTOMY     Reproductive/Obstetrics negative OB ROS                             Anesthesia Physical Anesthesia Plan  ASA: III  Anesthesia Plan: General   Post-op Pain Management:    Induction:   Airway Management Planned:   Additional Equipment:   Intra-op Plan:   Post-operative Plan:   Informed Consent: I have reviewed the patients History and Physical, chart, labs and discussed the procedure including the risks, benefits and alternatives for the proposed anesthesia with the patient or authorized representative who has indicated his/her understanding and acceptance.   Dental Advisory Given  Plan Discussed with: Anesthesiologist, CRNA and Surgeon  Anesthesia Plan Comments:         Anesthesia Quick Evaluation

## 2015-08-27 NOTE — Op Note (Signed)
[facility] Patient Name: Tamara Mejia Procedure Date: 08/27/2015 10:27 AM MRN: [id number] Account #: [id number] Date of Birth: Jan 24, 1950 Admit Type: Outpatient Age: 65 Room: [facility] ENDO ROOM 4 Gender: Female Note Status: Finalized Procedure:            Colonoscopy Indications:          Screening for colorectal malignant neoplasm Providers:            Lucilla Lame MD, MD Referring MD:         Ocie Cornfield. Ouida Sills MD, MD (Referring MD) Medicines:            Propofol per Anesthesia Complications:        No immediate complications. Procedure:            Pre-Anesthesia Assessment:                       - Prior to the procedure, a History and Physical was                        performed, and patient medications and allergies were                        reviewed. The patient's tolerance of previous                        anesthesia was also reviewed. The risks and benefits of                        the procedure and the sedation options and risks were                        discussed with the patient. All questions were                        answered, and informed consent was obtained. Prior                        Anticoagulants: The patient has taken no previous                        anticoagulant or antiplatelet agents. ASA Grade                        Assessment: II - A patient with mild systemic disease.                        After reviewing the risks and benefits, the patient was                        deemed in satisfactory condition to undergo the                        procedure.                       After obtaining informed consent, the colonoscope was                        passed under direct vision. Throughout the procedure,  the patient's blood pressure, pulse, and oxygen                        saturations were monitored continuously. The Olympus                        CF-Q160AL colonoscope (S#. [id number]) was  introduced                        through the anus and advanced to the the cecum,                        identified by appendiceal orifice and ileocecal valve.                        The colonoscopy was performed without difficulty. The                        patient tolerated the procedure well. The quality of                        the bowel preparation was excellent. Findings:      The perianal and digital rectal examinations were normal.      Two sessile polyps were found in the rectum. The polyps were 2 to 3 mm       in size. These polyps were removed with a cold biopsy forceps. Resection       and retrieval were complete.      Non-bleeding internal hemorrhoids were found during retroflexion. The       hemorrhoids were Grade I (internal hemorrhoids that do not prolapse). Impression:           - Two 2 to 3 mm polyps in the rectum, removed with a                        cold biopsy forceps. Resected and retrieved.                       - Non-bleeding internal hemorrhoids. Recommendation:       - Await pathology results.                       - Repeat colonoscopy in 5 years if polyp adenoma and 10                        years if hyperplastic Procedure Code(s):    --- Professional ---                       [id number], Colonoscopy, flexible; with biopsy, single or                        multiple Diagnosis Code(s):    --- Professional ---                       Z12.11, Encounter for screening for malignant neoplasm                        of colon  K62.1, Rectal polyp CPT copyright 2016 American Medical Association. All rights reserved. The codes documented in this report are preliminary and upon coder review may  be revised to meet current compliance requirements. Lucilla Lame MD, MD 08/27/2015 10:48:09 AM This report has been signed electronically. Number of Addenda: 0 Note Initiated On: 08/27/2015 10:27 AM Scope Withdrawal Time: 0 hours 9 minutes 16 seconds  Total  Procedure Duration: 0 hours 14 minutes 38 seconds       [facility]

## 2015-08-27 NOTE — Transfer of Care (Signed)
Immediate Anesthesia Transfer of Care Note  Patient: Tamara Mejia  Procedure(s) Performed: Procedure(s): COLONOSCOPY WITH PROPOFOL (N/A)  Patient Location: PACU and Endoscopy Unit  Anesthesia Type:General  Level of Consciousness: sedated  Airway & Oxygen Therapy: Patient Spontanous Breathing and Patient connected to nasal cannula oxygen  Post-op Assessment: Report given to RN and Post -op Vital signs reviewed and stable  Post vital signs: Reviewed and stable  Last Vitals:  Vitals:   08/27/15 0912  BP: (!) 152/100  Pulse: 95  Resp: 16  Temp: 36.6 C    Last Pain:  Vitals:   08/27/15 0912  TempSrc: Tympanic         Complications: No apparent anesthesia complications

## 2015-08-27 NOTE — Anesthesia Postprocedure Evaluation (Signed)
Anesthesia Post Note  Patient: Vonita Moss  Procedure(s) Performed: Procedure(s) (LRB): COLONOSCOPY WITH PROPOFOL (N/A)  Patient location during evaluation: Endoscopy Anesthesia Type: General Level of consciousness: awake and alert Pain management: pain level controlled Vital Signs Assessment: post-procedure vital signs reviewed and stable Respiratory status: spontaneous breathing, nonlabored ventilation, respiratory function stable and patient connected to nasal cannula oxygen Cardiovascular status: blood pressure returned to baseline and stable Postop Assessment: no signs of nausea or vomiting Anesthetic complications: no    Last Vitals:  Vitals:   08/27/15 1114 08/27/15 1124  BP: 135/68 131/63  Pulse: 76 78  Resp: 18 16  Temp:      Last Pain:  Vitals:   08/27/15 0912  TempSrc: Tympanic                 Precious Haws Adalis Gatti

## 2015-08-27 NOTE — H&P (Signed)
Lucilla Lame, MD [address] [place], [place] [address] Phone: [phone] Fax : [phone]  Primary Care Physician:  Kirk Ruths., MD Primary Gastroenterologist:  Dr. Allen Norris  Pre-Procedure History & Physical: HPI:  Tamara Mejia is a 65 y.o. female is here for a screening colonoscopy.   Past Medical History:  Diagnosis Date  . Diabetes ([place])   . High cholesterol   . Seizure [facility])     Past Surgical History:  Procedure Laterality Date  . BREAST BIOPSY Right    neg  . CHOLECYSTECTOMY      Prior to Admission medications   Medication Sig Start Date End Date Taking? Authorizing Provider  amLODipine (NORVASC) 5 MG tablet Take 5 mg by mouth daily.   Yes Historical Provider, MD  aspirin 81 MG EC tablet Take 81 mg by mouth daily. Swallow whole.   Yes Historical Provider, MD  carbamazepine (TEGRETOL) 200 MG tablet Take 1 tablet (200 mg total) by mouth 3 (three) times daily. 10/08/14  Yes Marcial Pacas, MD  lisinopril-hydrochlorothiazide (PRINZIDE,ZESTORETIC) 10-12.5 MG tablet  07/08/15  Yes Historical Provider, MD  simvastatin (ZOCOR) 20 MG tablet Take 20 mg by mouth at bedtime.   Yes Historical Provider, MD  Cholecalciferol (VITAMIN D-3) 1000 UNITS CAPS Take 1,000 Units by mouth 2 (two) times daily.    Historical Provider, MD  Dextromethorphan-Guaifenesin (TUSSIN DM PO) Take by mouth. Reported on 08/05/2015    Historical Provider, MD  metFORMIN (GLUCOPHAGE) 500 MG tablet Take 500 mg by mouth 2 (two) times daily with a meal.    Historical Provider, MD  traMADol Veatrice Bourbon) 50 MG tablet Reported on 04/04/2015 05/04/14   Historical Provider, MD    Allergies as of 08/05/2015  . (No Known Allergies)    Family History  Problem Relation Age of Onset  . Diabetes Father   . Heart disease Father   . Heart disease Brother   . Breast cancer Cousin     Social History   Social History  . Marital status: Single    Spouse name: N/A  . Number of children: N/A  . Years of  education: N/A   Occupational History  . Not on file.   Social History Main Topics  . Smoking status: Never Smoker  . Smokeless tobacco: Never Used  . Alcohol use No  . Drug use: No  . Sexual activity: Not on file   Other Topics Concern  . Not on file   Social History Narrative   Patient works for cutting board   Patient has her adult education certificate   Patient drinks about 2 cups of coffee daily.           Review of Systems: See HPI, otherwise negative ROS  Physical Exam: BP (!) 152/100   Pulse 95   Temp 97.8 F (36.6 C) (Tympanic)   Resp 16   Ht 5' 3.5" (1.613 m)   Wt 202 lb (91.6 kg)   SpO2 97%   BMI 35.22 kg/m  General:   Alert,  pleasant and cooperative in NAD Head:  Normocephalic and atraumatic. Neck:  Supple; no masses or thyromegaly. Lungs:  Clear throughout to auscultation.    Heart:  Regular rate and rhythm. Abdomen:  Soft, nontender and nondistended. Normal bowel sounds, without guarding, and without rebound.   Neurologic:  Alert and  oriented x4;  grossly normal neurologically.  Impression/Plan: Tamara Mejia is now here to undergo a screening colonoscopy.  Risks, benefits, and alternatives regarding colonoscopy have  been reviewed with the patient.  Questions have been answered.  All parties agreeable.

## 2015-08-28 ENCOUNTER — Encounter: Payer: Self-pay | Admitting: Gastroenterology

## 2015-08-28 LAB — SURGICAL PATHOLOGY

## 2015-08-28 NOTE — Anesthesia Postprocedure Evaluation (Signed)
Anesthesia Post Note  Patient: Tamara Mejia  Procedure(s) Performed: Procedure(s) (LRB): COLONOSCOPY WITH PROPOFOL (N/A)  Patient location during evaluation: Endoscopy Anesthesia Type: General Level of consciousness: awake and alert Pain management: pain level controlled Vital Signs Assessment: post-procedure vital signs reviewed and stable Respiratory status: spontaneous breathing, nonlabored ventilation, respiratory function stable and patient connected to nasal cannula oxygen Cardiovascular status: blood pressure returned to baseline and stable Postop Assessment: no signs of nausea or vomiting Anesthetic complications: no    Last Vitals:  Vitals:   08/27/15 1114 08/27/15 1124  BP: 135/68 131/63  Pulse: 76 78  Resp: 18 16  Temp:      Last Pain:  Vitals:   08/27/15 0912  TempSrc: Tympanic                 Precious Haws [place]

## 2015-09-07 ENCOUNTER — Encounter: Payer: Self-pay | Admitting: Gastroenterology

## 2015-11-04 ENCOUNTER — Other Ambulatory Visit: Payer: Self-pay | Admitting: *Deleted

## 2015-11-04 ENCOUNTER — Telehealth: Payer: Self-pay | Admitting: Neurology

## 2015-11-04 MED ORDER — CARBAMAZEPINE 200 MG PO TABS: 200.0000 mg | ORAL_TABLET | Freq: Three times a day (TID) | ORAL | 0 refills | Status: DC

## 2015-11-04 NOTE — Telephone Encounter (Signed)
30-day rx sent to requested pharmacy.  Pt has a pending yearly appt on 11/21/15.

## 2015-11-04 NOTE — Telephone Encounter (Addendum)
[facility] [phone] ext [phone] (this number is for [place] coordinator) called request refill for carbamazepine (TEGRETOL) 200 MG tablet . Please send to [facility] in [place]

## 2015-11-21 ENCOUNTER — Ambulatory Visit (INDEPENDENT_AMBULATORY_CARE_PROVIDER_SITE_OTHER): Payer: Medicare Other | Admitting: Nurse Practitioner

## 2015-11-21 ENCOUNTER — Encounter: Payer: Self-pay | Admitting: Nurse Practitioner

## 2015-11-21 VITALS — BP 130/92 | HR 104 | Ht 62.0 in | Wt 200.8 lb

## 2015-11-21 DIAGNOSIS — G40309 Generalized idiopathic epilepsy and epileptic syndromes, not intractable, without status epilepticus: Secondary | ICD-10-CM | POA: Diagnosis not present

## 2015-11-21 MED ORDER — CARBAMAZEPINE 200 MG PO TABS: 200.0000 mg | ORAL_TABLET | Freq: Three times a day (TID) | ORAL | 11 refills | Status: DC

## 2015-11-21 NOTE — Progress Notes (Signed)
[facility]  PATIENT: Tamara Mejia DOB: Dec 22, 1950   REASON FOR VISIT: Follow-up for seizure disorder  HISTORY FROM: Patient and caregiver    HISTORY OF PRESENT ILLNESS:Tamara Mejia a 65 year old, right handed, Caucasian female who is a resident in a group home, follows up for seizure disorder.   She has past medical history of epilepsy. 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 She is independent in her ADL's but needs supervision and direction. She has generalized tonic-clonic seizures. She sometimes has an aura. She had breakthrough seizures approximately 12 years ago, around 2000,  when she missed taking some of her Tegretol during a vacation with her family. There was no recurrent seizure as long as she is taking her carbamazepine 200mg  tid.  She has had an EEG which showed frequent appearance of short lasting bilateral frontal predominant semirhythmic delta activity with large amplitude, indicating bilateral cerebral dysfunction, mainly frontal regions. There is no evidence of epileptiform discharge. The EEG findings can be due to mental retardation or metabolic toxic reasons.  UPDATE 10/04/13: No seizure activity in 12 years. No behavior issues, lives in group home. No new neurologic complaints. She is with c/g today. She returns for reevaluation. She does go to a day program.   UPDATE Oct 08 2014: She is accompanied by her group home staff Linna Hoff, Last clinical visit was with Hoyle Sauer in September 2015,she continued to take her Tegretol 200 mg 3 times a day, she tolerated the medication well, there was no significant side effect. There was no recurrent seizure documented.  She fell in April 2016 at an outdoor event, she was checked at [facility], no significant abnormality, no seizure reported.  "the heat did not argree with me",Last seizure was  around 2000.  UPDATE 11/2/ 2017CM: Tamara.  Mejia, 65 year old female returns for follow-up with history of epilepsy. She had meningitis in childhood and has had mental retardation and seizures since that time. She is independent in activities of daily living but continues to live in a group home setting with supervision. Last seizure activity was around the year 2000. She is currently on Tegretol without side effects. She has not had falls or balance issues. She returns for reevaluation    REVIEW OF SYSTEMS: Full 14 system review of systems performed and notable only for those listed, all others are neg:  Constitutional: neg  Cardiovascular: neg Ear/Nose/Throat: neg  Skin: neg Eyes: neg Respiratory: neg Gastroitestinal: neg  Hematology/Lymphatic: neg  Endocrine: neg Musculoskeletal:neg Allergy/Immunology: neg Neurological: neg Psychiatric: neg Sleep : neg   ALLERGIES: No Known Allergies  HOME MEDICATIONS: Outpatient Medications Prior to Visit  Medication Sig Dispense Refill  . amLODipine (NORVASC) 5 MG tablet Take 5 mg by mouth daily.    Marland Kitchen aspirin 81 MG EC tablet Take 81 mg by mouth daily. Swallow whole.    . carbamazepine (TEGRETOL) 200 MG tablet Take 1 tablet (200 mg total) by mouth 3 (three) times daily. 90 tablet 0  . Cholecalciferol (VITAMIN D-3) 1000 UNITS CAPS Take 1,000 Units by mouth 2 (two) times daily.    Marland Kitchen lisinopril-hydrochlorothiazide (PRINZIDE,ZESTORETIC) 10-12.5 MG tablet     . metFORMIN (GLUCOPHAGE) 500 MG tablet Take 500 mg by mouth 2 (two) times daily with a meal.    . simvastatin (ZOCOR) 20 MG tablet Take 20 mg by mouth at bedtime.    Marland Kitchen Dextromethorphan-Guaifenesin (TUSSIN DM PO) Take by mouth. Reported on 08/05/2015    .  traMADol (ULTRAM) 50 MG tablet Reported on 04/04/2015     No facility-administered medications prior to visit.     PAST MEDICAL HISTORY: Past Medical History:  Diagnosis Date  . Diabetes ([place])   . High cholesterol   . Seizure ([place])     PAST SURGICAL HISTORY: Past Surgical  History:  Procedure Laterality Date  . BREAST BIOPSY Right    neg  . CHOLECYSTECTOMY    . COLONOSCOPY WITH PROPOFOL N/A 08/27/2015   Procedure: COLONOSCOPY WITH PROPOFOL;  Surgeon: Lucilla Lame, MD;  Location: [facility] ENDOSCOPY;  Service: Endoscopy;  Laterality: N/A;    FAMILY HISTORY: Family History  Problem Relation Age of Onset  . Diabetes Father   . Heart disease Father   . Heart disease Brother   . Breast cancer Cousin     SOCIAL HISTORY: Social History   Social History  . Marital status: Single    Spouse name: N/A  . Number of children: N/A  . Years of education: N/A   Occupational History  . Not on file.   Social History Main Topics  . Smoking status: Never Smoker  . Smokeless tobacco: Never Used  . Alcohol use No  . Drug use: No  . Sexual activity: Not on file   Other Topics Concern  . Not on file   Social History Narrative   Patient works for cutting board   Patient has her adult education certificate   Patient drinks about 2 cups of coffee daily.            PHYSICAL EXAM  Vitals:   11/21/15 0923  BP: (!) 130/92  Pulse: (!) 104  Weight: 200 lb 12.8 oz (91.1 kg)  Height: 5\' 2"  (1.575 m)   Body mass index is 36.73 kg/m.  Generalized: Well developed, in no acute distress,Well-groomed  Head: normocephalic and atraumatic,. Oropharynx benign  Neck: Supple, no carotid bruits  Cardiac: Regular rate rhythm, no murmur  Musculoskeletal: No deformity   Neurological examination   Mentation: Alert oriented to time, place,   Follows all commands speech and language fluent. Cooperative with exam  Cranial nerve II-XII: Pupils were equal round reactive to light extraocular movements were full, visual field were full on confrontational test. Facial sensation and strength were normal. hearing was intact to finger rubbing bilaterally. Uvula tongue midline. head turning and shoulder shrug were normal and symmetric.Tongue protrusion into cheek strength was  normal. Motor: normal bulk and tone, full strength in the BUE, BLE, fine finger movements normal, no pronator drift. No focal weakness Sensory: normal and symmetric to light touch, pinprick, and  Vibration, in the upper and lower extremities Coordination: finger-nose-finger, heel-to-shin bilaterally, no dysmetria Reflexes: Brachioradialis 2/2, biceps 2/2, triceps 2/2, patellar 2/2, Achilles 2/2, plantar responses were flexor bilaterally. Gait and Station: Rising up from seated position without assistance, normal stance,  moderate stride, good arm swing, smooth turning, able to perform tiptoe, and heel walking without difficulty. Tandem gait is steady  DIAGNOSTIC DATA (LABS, IMAGING, TESTING) -   ASSESSMENT AND PLAN  65 y.o. year old female  has a past medical history of Diabetes ([place]); High cholesterol; and Seizure ([place]). here to follow-up for her seizure disorder. She has mild MR Last recurrent seizure was in 2000               PLAN: Continue Tegretol 200 mg 3 times daily will refill for one year Labs are followed through primary care Dr. Ouida Sills Call for any seizure activity Follow-up yearly and  when necessary Dennie Bible, [facility], [facility], APRN  [facility] [address] [place], [place] [address] [phone]

## 2015-11-21 NOTE — Progress Notes (Signed)
I have reviewed and agreed above plan. 

## 2015-11-21 NOTE — Patient Instructions (Signed)
Per group home sheet 

## 2016-07-10 ENCOUNTER — Other Ambulatory Visit: Payer: Self-pay | Admitting: Internal Medicine

## 2016-07-10 DIAGNOSIS — Z1231 Encounter for screening mammogram for malignant neoplasm of breast: Secondary | ICD-10-CM

## 2016-08-12 ENCOUNTER — Ambulatory Visit
Admission: RE | Admit: 2016-08-12 | Discharge: 2016-08-12 | Disposition: A | Discharge status: Home / Self Care | Payer: Medicare Other | Source: Ambulatory Visit | Attending: Internal Medicine | Admitting: Internal Medicine

## 2016-08-12 DIAGNOSIS — Z1231 Encounter for screening mammogram for malignant neoplasm of breast: Secondary | ICD-10-CM | POA: Insufficient documentation

## 2016-11-23 NOTE — Progress Notes (Signed)
[facility]  PATIENT: Vonita Moss DOB: 10-22-1950   REASON FOR VISIT: Follow-up for seizure disorder  HISTORY FROM: Patient and caregiver Ms Jeneen Rinks    HISTORY OF PRESENT ILLNESS:Ms Games a 66 year old, right handed, Caucasian female who is a resident in a group home, follows up for seizure disorder.   She has past medical history of epilepsy. 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 She is independent in her ADL's but needs supervision and direction. She has generalized tonic-clonic seizures. She sometimes has an aura. She had breakthrough seizures approximately 12 years ago, around 2000,  when she missed taking some of her Tegretol during a vacation with her family. There was no recurrent seizure as long as she is taking her carbamazepine 200mg  tid.  She has had an EEG which showed frequent appearance of short lasting bilateral frontal predominant semirhythmic delta activity with large amplitude, indicating bilateral cerebral dysfunction, mainly frontal regions. There is no evidence of epileptiform discharge. The EEG findings can be due to mental retardation or metabolic toxic reasons.  UPDATE 10/04/13: No seizure activity in 12 years. No behavior issues, lives in group home. No new neurologic complaints. She is with c/g today. She returns for reevaluation. She does go to a day program.   UPDATE Oct 08 2014: She is accompanied by her group home staff Linna Hoff, Last clinical visit was with Hoyle Sauer in September 2015,she continued to take her Tegretol 200 mg 3 times a day, she tolerated the medication well, there was no significant side effect. There was no recurrent seizure documented.  She fell in April 2016 at an outdoor event, she was checked at [facility], no significant abnormality, no seizure reported.  "the heat did not argree with me",Last seizure was  around 2000.  UPDATE 11/2/ 2017CM:  Ms. Cavenaugh, 66 year old female returns for follow-up with history of epilepsy. She had meningitis in childhood and has had mental retardation and seizures since that time. She is independent in activities of daily living but continues to live in a group home setting with supervision. Last seizure activity was around the year 2000. She is currently on Tegretol without side effects. She has not had falls or balance issues. She returns for reevaluation   UPDATE 11/6/2018CM Ms. Bobby Rumpf, 66 year old female returns for follow-up with a history of generalized seizure disorder.  She had meningitis in childhood and also has mental retardation and seizures since that time.  She lives in a group home.  She is independent with her activities of daily living.  She is currently on Tegretol 200 mg 3 times daily without side effects to the medication.  No falls no balance issues.  Last seizure activity occurred in 2000.  She returns for reevaluation REVIEW OF SYSTEMS: Full 14 system review of systems performed and notable only for those listed, all others are neg:  Constitutional: neg  Cardiovascular: neg Ear/Nose/Throat: neg  Skin: neg Eyes: neg Respiratory: neg Gastroitestinal: neg  Hematology/Lymphatic: neg  Endocrine: neg Musculoskeletal:neg Allergy/Immunology: neg Neurological: neg Psychiatric: neg Sleep : neg   ALLERGIES: No Known Allergies  HOME MEDICATIONS: Outpatient Medications Prior to Visit  Medication Sig Dispense Refill  . amLODipine (NORVASC) 5 MG tablet Take 5 mg by mouth daily.    Marland Kitchen aspirin 81 MG EC tablet Take 81 mg by mouth daily. Swallow whole.    . carbamazepine (TEGRETOL) 200 MG tablet Take 1 tablet (200 mg total) by mouth 3 (three) times  daily. 90 tablet 11  . Cholecalciferol (VITAMIN D-3) 1000 UNITS CAPS Take 1,000 Units by mouth 2 (two) times daily.    Marland Kitchen lisinopril-hydrochlorothiazide (PRINZIDE,ZESTORETIC) 10-12.5 MG tablet     . metFORMIN (GLUCOPHAGE) 500 MG tablet Take 500 mg  by mouth 2 (two) times daily with a meal.    . simvastatin (ZOCOR) 20 MG tablet Take 20 mg by mouth at bedtime.     No facility-administered medications prior to visit.     PAST MEDICAL HISTORY: Past Medical History:  Diagnosis Date  . Diabetes ([place])   . High cholesterol   . Seizure ([place])     PAST SURGICAL HISTORY: Past Surgical History:  Procedure Laterality Date  . BREAST BIOPSY Right    neg  . CHOLECYSTECTOMY      FAMILY HISTORY: Family History  Problem Relation Age of Onset  . Diabetes Father   . Heart disease Father   . Heart disease Brother   . Breast cancer Cousin     SOCIAL HISTORY: Social History   Socioeconomic History  . Marital status: Single    Spouse name: Not on file  . Number of children: Not on file  . Years of education: Not on file  . Highest education level: Not on file  Social Needs  . Financial resource strain: Not on file  . Food insecurity - worry: Not on file  . Food insecurity - inability: Not on file  . Transportation needs - medical: Not on file  . Transportation needs - non-medical: Not on file  Occupational History  . Not on file  Tobacco Use  . Smoking status: Never Smoker  . Smokeless tobacco: Never Used  Substance and Sexual Activity  . Alcohol use: No  . Drug use: No  . Sexual activity: Not on file  Other Topics Concern  . Not on file  Social History Narrative   Patient works for cutting board   Patient has her adult education certificate   Patient drinks about 2 cups of coffee daily.            PHYSICAL EXAM  Vitals:   11/24/16 0922  BP: (!) 149/86  Pulse: 81  Weight: 197 lb 9.6 oz (89.6 kg)   Body mass index is 36.14 kg/m.  Generalized: Well developed, in no acute distress,Well-groomed , obese female Head: normocephalic and atraumatic,. Oropharynx benign  Neck: Supple,  Musculoskeletal: No deformity   Neurological examination   Mentation: Alert oriented to time, place,   Follows all commands speech  and language fluent. Cooperative with exam  Cranial nerve II-XII: Pupils were equal round reactive to light extraocular movements were full, visual field were full on confrontational test. Facial sensation and strength were normal. hearing was intact to finger rubbing bilaterally. Uvula tongue midline. head turning and shoulder shrug were normal and symmetric.Tongue protrusion into cheek strength was normal. Motor: normal bulk and tone, full strength in the BUE, BLE,  Sensory: normal and symmetric to light touch,  in the upper and lower extremities Coordination: finger-nose-finger, heel-to-shin bilaterally, no dysmetria Reflexes: Symmetric upper and lower plantar responses were flexor bilaterally. Gait and Station: Rising up from seated position without assistance, normal stance,  moderate stride, good arm swing, smooth turning, able to perform tiptoe, and heel walking without difficulty. Tandem gait is steady.  No assistive device  DIAGNOSTIC DATA (LABS, IMAGING, TESTING) -   ASSESSMENT AND PLAN  66 y.o. year old female  has a past medical history of Diabetes ([place]), High cholesterol,  and Seizure ([place]). here to follow-up for her seizure disorder. She has mild MR Last recurrent seizure was in 2000.  Reviewed recent CMP from 07/20/2016 by primary care within normal limits.  Lipid profile LDL 48 triglycerides 334.               PLAN: Continue Tegretol 200 mg 3 times daily will refill for one year Labs are followed through primary care Dr. Ouida Sills reviewed most recent 07/20/2016 Call for any seizure activity Follow-up yearly and when necessary Dennie Bible, [facility], [facility], [facility] [address] [place], [place] [address] [phone]

## 2016-11-24 ENCOUNTER — Encounter: Payer: Self-pay | Admitting: Nurse Practitioner

## 2016-11-24 ENCOUNTER — Ambulatory Visit (INDEPENDENT_AMBULATORY_CARE_PROVIDER_SITE_OTHER): Payer: Medicare Other | Admitting: Nurse Practitioner

## 2016-11-24 VITALS — BP 149/86 | HR 81 | Wt 197.6 lb

## 2016-11-24 DIAGNOSIS — G40309 Generalized idiopathic epilepsy and epileptic syndromes, not intractable, without status epilepticus: Secondary | ICD-10-CM

## 2016-11-24 MED ORDER — CARBAMAZEPINE 200 MG PO TABS: 200.0000 mg | ORAL_TABLET | Freq: Three times a day (TID) | ORAL | 11 refills | Status: DC

## 2016-11-24 NOTE — Patient Instructions (Signed)
Per group home sheet 

## 2016-11-24 NOTE — Progress Notes (Signed)
I have reviewed and agreed above plan. 

## 2017-04-19 ENCOUNTER — Encounter: Payer: Self-pay | Admitting: Nurse Practitioner

## 2017-07-28 DIAGNOSIS — F09 Unspecified mental disorder due to known physiological condition: Secondary | ICD-10-CM | POA: Insufficient documentation

## 2017-10-21 ENCOUNTER — Other Ambulatory Visit: Payer: Self-pay | Admitting: Nurse Practitioner

## 2017-11-26 NOTE — Progress Notes (Signed)
[facility]  PATIENT: Vonita Moss DOB: 1950-11-11   REASON FOR VISIT: Follow-up for seizure disorder  HISTORY FROM: Patient and caregiver Ms Jeneen Rinks    HISTORY OF PRESENT ILLNESS:Ms Cyran a 67 year old, right handed, Caucasian female who is a resident in a group home, follows up for seizure disorder.   She has past medical history of epilepsy. 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 She is independent in her ADL's but needs supervision and direction. She has generalized tonic-clonic seizures. She sometimes has an aura. She had breakthrough seizures approximately 12 years ago, around 2000,  when she missed taking some of her Tegretol during a vacation with her family. There was no recurrent seizure as long as she is taking her carbamazepine 200mg  tid.  She has had an EEG which showed frequent appearance of short lasting bilateral frontal predominant semirhythmic delta activity with large amplitude, indicating bilateral cerebral dysfunction, mainly frontal regions. There is no evidence of epileptiform discharge. The EEG findings can be due to mental retardation or metabolic toxic reasons.  UPDATE 10/04/13: No seizure activity in 12 years. No behavior issues, lives in group home. No new neurologic complaints. She is with c/g today. She returns for reevaluation. She does go to a day program.   UPDATE Oct 08 2014: She is accompanied by her group home staff Linna Hoff, Last clinical visit was with Hoyle Sauer in September 2015,she continued to take her Tegretol 200 mg 3 times a day, she tolerated the medication well, there was no significant side effect. There was no recurrent seizure documented.  She fell in April 2016 at an outdoor event, she was checked at [facility], no significant abnormality, no seizure reported.  "the heat did not argree with me",Last seizure was  around 2000.  UPDATE 11/2/ 2017CM:  Ms. Charette, 67 year old female returns for follow-up with history of epilepsy. She had meningitis in childhood and has had mental retardation and seizures since that time. She is independent in activities of daily living but continues to live in a group home setting with supervision. Last seizure activity was around the year 2000. She is currently on Tegretol without side effects. She has not had falls or balance issues. She returns for reevaluation   UPDATE 11/6/2018CM Ms. Bobby Rumpf, 67 year old female returns for follow-up with a history of generalized seizure disorder.  She had meningitis in childhood and also has mental retardation and seizures since that time.  She lives in a group home.  She is independent with her activities of daily living.  She is currently on Tegretol 200 mg 3 times daily without side effects to the medication.  No falls no balance issues.  Last seizure activity occurred in 2000.  She returns for reevaluation UPDATE 11/18/2019CM Ms. Sarchet 67 year old female returns for follow-up with history of generalized seizure disorder.  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  Last seizure was in 2000.  She currently resides in a group home.  She goes to a day program.  She is independent with activities of daily living.  She is currently on Tegretol 203 times daily without side effects.  No falls no balance issues.  CBC and CMP reviewed in care everywhere.  She returns for reevaluation REVIEW OF SYSTEMS: Full 14 system review of systems performed and notable only for those listed, all others are neg:  Constitutional: neg  Cardiovascular: neg Ear/Nose/Throat: neg  Skin: neg Eyes: neg  Respiratory: neg Gastroitestinal: neg  Hematology/Lymphatic: neg  Endocrine: neg Musculoskeletal:neg Allergy/Immunology: neg Neurological: History of seizure disorder Psychiatric: neg Sleep : neg   ALLERGIES: No Known Allergies  HOME MEDICATIONS: Outpatient  Medications Prior to Visit  Medication Sig Dispense Refill  . amLODipine (NORVASC) 5 MG tablet Take 5 mg by mouth daily.    Marland Kitchen aspirin 81 MG EC tablet Take 81 mg by mouth daily. Swallow whole.    . carbamazepine (TEGRETOL) 200 MG tablet TAKE ONE TABLET BY MOUTH THREE TIMES DAILY. (SEIZURE CONTROL) 90 tablet 11  . Cholecalciferol (VITAMIN D-3) 1000 UNITS CAPS Take 1,000 Units by mouth 2 (two) times daily.    Marland Kitchen lisinopril-hydrochlorothiazide (PRINZIDE,ZESTORETIC) 10-12.5 MG tablet     . metFORMIN (GLUCOPHAGE) 500 MG tablet Take 500 mg by mouth 2 (two) times daily with a meal.    . simvastatin (ZOCOR) 20 MG tablet Take 20 mg by mouth at bedtime.     No facility-administered medications prior to visit.     PAST MEDICAL HISTORY: Past Medical History:  Diagnosis Date  . Diabetes ([place])   . High cholesterol   . Seizure ([place])     PAST SURGICAL HISTORY: Past Surgical History:  Procedure Laterality Date  . BREAST BIOPSY Right    neg  . CHOLECYSTECTOMY    . COLONOSCOPY WITH PROPOFOL N/A 08/27/2015   Procedure: COLONOSCOPY WITH PROPOFOL;  Surgeon: Lucilla Lame, MD;  Location: [facility] ENDOSCOPY;  Service: Endoscopy;  Laterality: N/A;    FAMILY HISTORY: Family History  Problem Relation Age of Onset  . Diabetes Father   . Heart disease Father   . Heart disease Brother   . Breast cancer Cousin     SOCIAL HISTORY: Social History   Socioeconomic History  . Marital status: Single    Spouse name: Not on file  . Number of children: Not on file  . Years of education: Not on file  . Highest education level: Not on file  Occupational History  . Not on file  Social Needs  . Financial resource strain: Not on file  . Food insecurity:    Worry: Not on file    Inability: Not on file  . Transportation needs:    Medical: Not on file    Non-medical: Not on file  Tobacco Use  . Smoking status: Never Smoker  . Smokeless tobacco: Never Used  Substance and Sexual Activity  . Alcohol use: No  .  Drug use: No  . Sexual activity: Not on file  Lifestyle  . Physical activity:    Days per week: Not on file    Minutes per session: Not on file  . Stress: Not on file  Relationships  . Social connections:    Talks on phone: Not on file    Gets together: Not on file    Attends religious service: Not on file    Active member of club or organization: Not on file    Attends meetings of clubs or organizations: Not on file    Relationship status: Not on file  . Intimate partner violence:    Fear of current or ex partner: Not on file    Emotionally abused: Not on file    Physically abused: Not on file    Forced sexual activity: Not on file  Other Topics Concern  . Not on file  Social History Narrative   Patient works for cutting board   Patient has her adult education certificate   Patient drinks about 2 cups  of coffee daily.            PHYSICAL EXAM  Vitals:   12/06/17 1021  BP: 112/66  Pulse: 71  Weight: 194 lb 9.6 oz (88.3 kg)  Height: 5\' 2"  (1.575 m)   Body mass index is 35.59 kg/m.  Generalized: Well developed, in no acute distress,Well-groomed , obese female Head: normocephalic and atraumatic,. Oropharynx benign  Neck: Supple,  Musculoskeletal: No deformity   Neurological examination   Mentation: Alert oriented to time, place,   Follows all commands speech and language fluent. Cooperative with exam  Cranial nerve II-XII: Pupils were equal round reactive to light extraocular movements were full, visual field were full on confrontational test. Facial sensation and strength were normal. hearing was intact to finger rubbing bilaterally. Uvula tongue midline. head turning and shoulder shrug were normal and symmetric.Tongue protrusion into cheek strength was normal. Motor: normal bulk and tone, full strength in the BUE, BLE,  Sensory: normal and symmetric to light touch,  in the upper and lower extremities Coordination: finger-nose-finger, heel-to-shin bilaterally, no  dysmetria Reflexes: Symmetric upper and lower plantar responses were flexor bilaterally. Gait and Station: Rising up from seated position without assistance, normal stance,  moderate stride, good arm swing, smooth turning, able to perform tiptoe, and heel walking without difficulty. Tandem gait is steady.  No assistive device  DIAGNOSTIC DATA (LABS, IMAGING, TESTING)    ASSESSMENT AND PLAN  67 y.o. year old female  has a past medical history of Diabetes ([place]), High cholesterol, and Seizure ([place]). here to follow-up for her seizure disorder. She has mild MR Last  seizure was in 2000.  Reviewed recent CMP, CBC by primary care on care everytwhere. within normal limits.                 PLAN: Continue Tegretol 200 mg 3 times daily will refill for one year Labs are followed through primary care Dr. Ouida Sills  Call for any seizure activity Follow-up yearly and when necessary Dennie Bible, [facility], [facility], [facility] [address], [place], [place] [address] [phone]

## 2017-11-30 ENCOUNTER — Ambulatory Visit: Payer: Medicare Other | Admitting: Nurse Practitioner

## 2017-12-06 ENCOUNTER — Encounter: Payer: Self-pay | Admitting: Nurse Practitioner

## 2017-12-06 ENCOUNTER — Ambulatory Visit (INDEPENDENT_AMBULATORY_CARE_PROVIDER_SITE_OTHER): Payer: Medicare Other | Admitting: Nurse Practitioner

## 2017-12-06 VITALS — BP 112/66 | HR 71 | Ht 62.0 in | Wt 194.6 lb

## 2017-12-06 DIAGNOSIS — G40309 Generalized idiopathic epilepsy and epileptic syndromes, not intractable, without status epilepticus: Secondary | ICD-10-CM | POA: Diagnosis not present

## 2017-12-06 NOTE — Patient Instructions (Signed)
Continue Tegretol 200 mg 3 times daily will refill for one year Labs are followed through primary care Dr. Ouida Sills  Call for any seizure activity Follow-up yearly and when necessary

## 2017-12-06 NOTE — Progress Notes (Signed)
I have reviewed and agreed above plan. 

## 2018-12-12 ENCOUNTER — Other Ambulatory Visit: Payer: Self-pay

## 2018-12-12 ENCOUNTER — Encounter: Payer: Self-pay | Admitting: Adult Health

## 2018-12-12 ENCOUNTER — Ambulatory Visit (INDEPENDENT_AMBULATORY_CARE_PROVIDER_SITE_OTHER): Payer: Medicare Other | Admitting: Adult Health

## 2018-12-12 VITALS — BP 134/75 | HR 77 | Temp 97.4°F | Ht 62.0 in | Wt 182.0 lb

## 2018-12-12 DIAGNOSIS — G40309 Generalized idiopathic epilepsy and epileptic syndromes, not intractable, without status epilepticus: Secondary | ICD-10-CM

## 2018-12-12 DIAGNOSIS — Z5181 Encounter for therapeutic drug level monitoring: Secondary | ICD-10-CM | POA: Diagnosis not present

## 2018-12-12 NOTE — Patient Instructions (Signed)
Continue Carbamazepine Blood work today If you have any seizure events please let us know.

## 2018-12-12 NOTE — Progress Notes (Signed)
PATIENT: Vonita Moss DOB: Jan 07, 1951  REASON FOR VISIT: follow up HISTORY FROM: patient  HISTORY OF PRESENT ILLNESS: Today 12/12/18:  Ms. Benedict is a 68 year old female with a history of mental retardation and seizures.  She returns today for follow-up.  Overall she is doing well.  She denies any seizure events.  She continues on carbamazepine 200 mg 3 times a day.  She continues to live at a group home.  She is able to complete all ADLs independently.  She does not cook there.  She denies any changes with her gait or balance.  She returns today for an evaluation.  HISTORY 12/06/2017:  Ms. Leppo 68 year old female returns for follow-up with history of generalized seizure disorder.  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  Last seizure was in 2000.  She currently resides in a group home.  She goes to a day program.  She is independent with activities of daily living.  She is currently on Tegretol 203 times daily without side effects.  No falls no balance issues.  CBC and CMP reviewed in care everywhere.  She returns for reevaluation  REVIEW OF SYSTEMS: Out of a complete 14 system review of symptoms, the patient complains only of the following symptoms, and all other reviewed systems are negative.  See HPI  ALLERGIES: No Known Allergies  HOME MEDICATIONS: Outpatient Medications Prior to Visit  Medication Sig Dispense Refill  . amLODipine (NORVASC) 5 MG tablet Take 5 mg by mouth daily.    Marland Kitchen aspirin 81 MG EC tablet Take 81 mg by mouth daily. Swallow whole.    . carbamazepine (TEGRETOL) 200 MG tablet TAKE ONE TABLET BY MOUTH THREE TIMES DAILY. (SEIZURE CONTROL) 90 tablet 11  . Cholecalciferol (VITAMIN D-3) 1000 UNITS CAPS Take 1,000 Units by mouth 2 (two) times daily.    Marland Kitchen lisinopril-hydrochlorothiazide (PRINZIDE,ZESTORETIC) 10-12.5 MG tablet     . metFORMIN (GLUCOPHAGE) 500 MG tablet Take 500 mg by mouth 2 (two) times daily with a meal.     . simvastatin (ZOCOR) 20 MG tablet Take 20 mg by mouth at bedtime.     No facility-administered medications prior to visit.     PAST MEDICAL HISTORY: Past Medical History:  Diagnosis Date  . Diabetes ([place])   . High cholesterol   . Seizure ([place])     PAST SURGICAL HISTORY: Past Surgical History:  Procedure Laterality Date  . BREAST BIOPSY Right    neg  . CHOLECYSTECTOMY    . COLONOSCOPY WITH PROPOFOL N/A 08/27/2015   Procedure: COLONOSCOPY WITH PROPOFOL;  Surgeon: Lucilla Lame, MD;  Location: [facility] ENDOSCOPY;  Service: Endoscopy;  Laterality: N/A;    FAMILY HISTORY: Family History  Problem Relation Age of Onset  . Diabetes Father   . Heart disease Father   . Heart disease Brother   . Breast cancer Cousin     SOCIAL HISTORY: Social History   Socioeconomic History  . Marital status: Single    Spouse name: Not on file  . Number of children: Not on file  . Years of education: Not on file  . Highest education level: Not on file  Occupational History  . Not on file  Social Needs  . Financial resource strain: Not on file  . Food insecurity    Worry: Not on file    Inability: Not on file  . Transportation needs    Medical: Not on file    Non-medical: Not on file  Tobacco  Use  . Smoking status: Never Smoker  . Smokeless tobacco: Never Used  Substance and Sexual Activity  . Alcohol use: No  . Drug use: No  . Sexual activity: Not on file  Lifestyle  . Physical activity    Days per week: Not on file    Minutes per session: Not on file  . Stress: Not on file  Relationships  . Social Herbalist on phone: Not on file    Gets together: Not on file    Attends religious service: Not on file    Active member of club or organization: Not on file    Attends meetings of clubs or organizations: Not on file    Relationship status: Not on file  . Intimate partner violence    Fear of current or ex partner: Not on file    Emotionally abused: Not on file     Physically abused: Not on file    Forced sexual activity: Not on file  Other Topics Concern  . Not on file  Social History Narrative   Patient works for cutting board   Patient has her adult education certificate   Patient drinks about 2 cups of coffee daily.             PHYSICAL EXAM  Vitals:   12/12/18 0921  BP: 134/75  Pulse: 77  Temp: (!) 97.4 F (36.3 C)  TempSrc: Oral  Weight: 182 lb (82.6 kg)  Height: 5\' 2"  (1.575 m)   Body mass index is 33.29 kg/m.  Generalized: Well developed, in no acute distress   Neurological examination  Mentation: Alert oriented to time, place, history taking. Follows all commands speech and language fluent Cranial nerve II-XII: Pupils were equal round reactive to light. Extraocular movements were full, visual field were full on confrontational test.  Head turning and shoulder shrug  were normal and symmetric. Motor: The motor testing reveals 5 over 5 strength of all 4 extremities. Good symmetric motor tone is noted throughout.  Sensory: Sensory testing is intact to soft touch on all 4 extremities. No evidence of extinction is noted.  Coordination: Cerebellar testing reveals good finger-nose-finger and heel-to-shin bilaterally.  Gait and station: Gait is normal.    DIAGNOSTIC DATA (LABS, IMAGING, TESTING) - I reviewed patient records, labs, notes, testing and imaging myself where available.  Lab Results  Component Value Date   WBC 5.1 10/04/2013   HGB 13.0 10/04/2013   HCT 37.7 10/04/2013   MCV 88 10/04/2013   PLT 260 10/04/2013      Component Value Date/Time   NA 138 10/04/2013 0956   NA 139 12/22/2011 2245   K 3.9 10/04/2013 0956   K 3.8 12/22/2011 2245   CL 100 10/04/2013 0956   CL 104 12/22/2011 2245   CO2 26 10/04/2013 0956   CO2 29 12/22/2011 2245   GLUCOSE 135 (H) 10/04/2013 0956   GLUCOSE 100 (H) 12/22/2011 2245   BUN 14 10/04/2013 0956   BUN 18 12/22/2011 2245   CREATININE 0.68 10/04/2013 0956   CREATININE 0.81  12/22/2011 2245   CALCIUM 9.6 10/04/2013 0956   CALCIUM 9.1 12/22/2011 2245   PROT 6.8 10/04/2013 0956   PROT 7.6 12/22/2011 2245   ALBUMIN 4.3 10/04/2013 0956   ALBUMIN 3.8 12/22/2011 2245   AST 22 10/04/2013 0956   AST 34 12/22/2011 2245   ALT 22 10/04/2013 0956   ALT 33 12/22/2011 2245   ALKPHOS 68 10/04/2013 0956   ALKPHOS 83  12/22/2011 2245   BILITOT <0.2 10/04/2013 0956   BILITOT 0.2 12/22/2011 2245   GFRNONAA 95 10/04/2013 0956   GFRNONAA >60 12/22/2011 2245   GFRAA 109 10/04/2013 0956   GFRAA >60 12/22/2011 2245      ASSESSMENT AND PLAN 68 y.o. year old female  has a past medical history of Diabetes ([place]), High cholesterol, and Seizure ([place]). here with:  1.  Seizures  -Continue carbamazepine 200 mg 3 times a day -Blood work today-CMP, CMP and carbamazepine level  Overall the patient is remained stable.  Advised that if she has any seizure event she should let us know.  She will follow-up in 1 year or sooner if needed.   I spent 15 minutes with the patient. 50% of this time was spent discussing plan of care  Ward Givens, MSN, NP-C 12/12/2018, 9:13 AM [facility] [address], [place], [place] [address] [phone]

## 2018-12-13 ENCOUNTER — Telehealth: Payer: Self-pay

## 2018-12-13 LAB — CBC WITH DIFFERENTIAL/PLATELET
Basophils Absolute: 0 10*3/uL (ref 0.0–0.2)
Basos: 1 %
EOS (ABSOLUTE): 0.1 10*3/uL (ref 0.0–0.4)
Eos: 3 %
Hematocrit: 37.9 % (ref 34.0–46.6)
Hemoglobin: 12.5 g/dL (ref 11.1–15.9)
Immature Grans (Abs): 0 10*3/uL (ref 0.0–0.1)
Immature Granulocytes: 1 %
Lymphocytes Absolute: 1.4 10*3/uL (ref 0.7–3.1)
Lymphs: 34 %
MCH: 30.2 pg (ref 26.6–33.0)
MCHC: 33 g/dL (ref 31.5–35.7)
MCV: 92 fL (ref 79–97)
Monocytes Absolute: 0.3 10*3/uL (ref 0.1–0.9)
Monocytes: 8 %
Neutrophils Absolute: 2.2 10*3/uL (ref 1.4–7.0)
Neutrophils: 53 %
Platelets: 212 10*3/uL (ref 150–450)
RBC: 4.14 x10E6/uL (ref 3.77–5.28)
RDW: 12.3 % (ref 11.7–15.4)
WBC: 4.1 10*3/uL (ref 3.4–10.8)

## 2018-12-13 LAB — COMPREHENSIVE METABOLIC PANEL
ALT: 19 IU/L (ref 0–32)
AST: 14 IU/L (ref 0–40)
Albumin/Globulin Ratio: 1.6 (ref 1.2–2.2)
Albumin: 4.2 g/dL (ref 3.8–4.8)
Alkaline Phosphatase: 104 IU/L (ref 39–117)
BUN/Creatinine Ratio: 21 (ref 12–28)
BUN: 14 mg/dL (ref 8–27)
Bilirubin Total: <0.2 mg/dL (ref 0.0–1.2)
CO2: 26 mmol/L (ref 20–29)
Calcium: 9.5 mg/dL (ref 8.7–10.3)
Chloride: 102 mmol/L (ref 96–106)
Creatinine, Ser: 0.66 mg/dL (ref 0.57–1.00)
GFR calc Af Amer: 106 mL/min/{1.73_m2} (ref >59)
GFR calc non Af Amer: 92 mL/min/{1.73_m2} (ref >59)
Globulin, Total: 2.7 g/dL (ref 1.5–4.5)
Glucose: 91 mg/dL (ref 65–99)
Potassium: 4.3 mmol/L (ref 3.5–5.2)
Sodium: 141 mmol/L (ref 134–144)
Total Protein: 6.9 g/dL (ref 6.0–8.5)

## 2018-12-13 LAB — CARBAMAZEPINE LEVEL, TOTAL: Carbamazepine (Tegretol), S: 8 ug/mL (ref 4.0–12.0)

## 2018-12-13 NOTE — Telephone Encounter (Signed)
Spoke with the patient and she verbalized understanding her results. No questions or concerns at this time.   

## 2018-12-13 NOTE — Telephone Encounter (Signed)
-----   Message from Ward Givens, NP sent at 12/13/2018  1:20 PM EST ----- Labs results are unremarkable. Please call patient with results.

## 2019-03-22 NOTE — Progress Notes (Signed)
I have reviewed and agreed above plan. If patient agrees, may consider change to tegretol xr twice a day with equivalent dosage.

## 2019-04-15 ENCOUNTER — Encounter: Payer: Self-pay | Admitting: Emergency Medicine

## 2019-04-15 ENCOUNTER — Inpatient Hospital Stay
Admission: EM | Admit: 2019-04-15 | Discharge: 2019-04-17 | DRG: 392 | Disposition: A | Discharge status: Home / Self Care | Payer: Medicare Other | Attending: Internal Medicine | Admitting: Internal Medicine

## 2019-04-15 ENCOUNTER — Emergency Department: Payer: Medicare Other

## 2019-04-15 ENCOUNTER — Other Ambulatory Visit: Payer: Self-pay

## 2019-04-15 DIAGNOSIS — R197 Diarrhea, unspecified: Secondary | ICD-10-CM

## 2019-04-15 DIAGNOSIS — L509 Urticaria, unspecified: Secondary | ICD-10-CM | POA: Diagnosis present

## 2019-04-15 DIAGNOSIS — Z20822 Contact with and (suspected) exposure to covid-19: Secondary | ICD-10-CM | POA: Diagnosis present

## 2019-04-15 DIAGNOSIS — F79 Unspecified intellectual disabilities: Secondary | ICD-10-CM | POA: Diagnosis present

## 2019-04-15 DIAGNOSIS — Z833 Family history of diabetes mellitus: Secondary | ICD-10-CM

## 2019-04-15 DIAGNOSIS — Z7982 Long term (current) use of aspirin: Secondary | ICD-10-CM

## 2019-04-15 DIAGNOSIS — R109 Unspecified abdominal pain: Secondary | ICD-10-CM

## 2019-04-15 DIAGNOSIS — Z79899 Other long term (current) drug therapy: Secondary | ICD-10-CM

## 2019-04-15 DIAGNOSIS — E785 Hyperlipidemia, unspecified: Secondary | ICD-10-CM | POA: Diagnosis present

## 2019-04-15 DIAGNOSIS — I959 Hypotension, unspecified: Secondary | ICD-10-CM | POA: Diagnosis present

## 2019-04-15 DIAGNOSIS — G40909 Epilepsy, unspecified, not intractable, without status epilepticus: Secondary | ICD-10-CM | POA: Diagnosis present

## 2019-04-15 DIAGNOSIS — G40309 Generalized idiopathic epilepsy and epileptic syndromes, not intractable, without status epilepticus: Secondary | ICD-10-CM | POA: Diagnosis present

## 2019-04-15 DIAGNOSIS — E119 Type 2 diabetes mellitus without complications: Secondary | ICD-10-CM | POA: Diagnosis present

## 2019-04-15 DIAGNOSIS — X58XXXA Exposure to other specified factors, initial encounter: Secondary | ICD-10-CM | POA: Diagnosis present

## 2019-04-15 DIAGNOSIS — R55 Syncope and collapse: Secondary | ICD-10-CM

## 2019-04-15 DIAGNOSIS — M47816 Spondylosis without myelopathy or radiculopathy, lumbar region: Secondary | ICD-10-CM | POA: Diagnosis present

## 2019-04-15 DIAGNOSIS — R1110 Vomiting, unspecified: Secondary | ICD-10-CM

## 2019-04-15 DIAGNOSIS — Z6835 Body mass index (BMI) 35.0-35.9, adult: Secondary | ICD-10-CM

## 2019-04-15 DIAGNOSIS — I10 Essential (primary) hypertension: Secondary | ICD-10-CM | POA: Diagnosis present

## 2019-04-15 DIAGNOSIS — Z8249 Family history of ischemic heart disease and other diseases of the circulatory system: Secondary | ICD-10-CM

## 2019-04-15 DIAGNOSIS — E876 Hypokalemia: Secondary | ICD-10-CM | POA: Diagnosis present

## 2019-04-15 DIAGNOSIS — G9389 Other specified disorders of brain: Secondary | ICD-10-CM | POA: Diagnosis present

## 2019-04-15 DIAGNOSIS — K529 Noninfective gastroenteritis and colitis, unspecified: Secondary | ICD-10-CM | POA: Diagnosis not present

## 2019-04-15 DIAGNOSIS — T7840XA Allergy, unspecified, initial encounter: Secondary | ICD-10-CM | POA: Diagnosis present

## 2019-04-15 LAB — URINALYSIS, COMPLETE (UACMP) WITH MICROSCOPIC
Glucose, UA: 50 mg/dL — AB
Hgb urine dipstick: NEGATIVE
Ketones, ur: NEGATIVE mg/dL
Nitrite: NEGATIVE
Protein, ur: 100 mg/dL — AB
Specific Gravity, Urine: 1.025 (ref 1.005–1.030)
pH: 5 (ref 5.0–8.0)

## 2019-04-15 LAB — CARBAMAZEPINE LEVEL, TOTAL: Carbamazepine Lvl: 4.7 ug/mL (ref 4.0–12.0)

## 2019-04-15 MED ORDER — SODIUM CHLORIDE 0.9 % IV BOLUS
500.0000 mL | Freq: Once | INTRAVENOUS | Status: AC
  Administered 2019-04-15: 500 mL via INTRAVENOUS

## 2019-04-15 MED ORDER — METHYLPREDNISOLONE SODIUM SUCC 125 MG IJ SOLR
125.0000 mg | Freq: Once | INTRAMUSCULAR | Status: AC
  Administered 2019-04-15: 125 mg via INTRAVENOUS
  Filled 2019-04-15: qty 2

## 2019-04-15 MED ORDER — FAMOTIDINE IN NACL 20-0.9 MG/50ML-% IV SOLN
20.0000 mg | Freq: Once | INTRAVENOUS | Status: AC
  Administered 2019-04-15: 20 mg via INTRAVENOUS
  Filled 2019-04-15: qty 50

## 2019-04-15 NOTE — ED Triage Notes (Signed)
Pt arrives via [facility] from [place] arrives with AMS and syncopal episodes x 2. Per EMS pt was alert to painful stimuli only at time of transport. Pt has hx of seizures and is now A/O x4 with MD Monks. Pt has rash across body with unknown source. Per EMS, pt's initial BP was 86/52 and increased to 145/90. EMS administered 50 mg Benadryl.

## 2019-04-16 ENCOUNTER — Inpatient Hospital Stay: Payer: Medicare Other

## 2019-04-16 DIAGNOSIS — Z8249 Family history of ischemic heart disease and other diseases of the circulatory system: Secondary | ICD-10-CM | POA: Diagnosis not present

## 2019-04-16 DIAGNOSIS — G9389 Other specified disorders of brain: Secondary | ICD-10-CM | POA: Diagnosis present

## 2019-04-16 DIAGNOSIS — Z7982 Long term (current) use of aspirin: Secondary | ICD-10-CM | POA: Diagnosis not present

## 2019-04-16 DIAGNOSIS — Z20822 Contact with and (suspected) exposure to covid-19: Secondary | ICD-10-CM | POA: Diagnosis present

## 2019-04-16 DIAGNOSIS — F79 Unspecified intellectual disabilities: Secondary | ICD-10-CM | POA: Diagnosis present

## 2019-04-16 DIAGNOSIS — E876 Hypokalemia: Secondary | ICD-10-CM

## 2019-04-16 DIAGNOSIS — Z6835 Body mass index (BMI) 35.0-35.9, adult: Secondary | ICD-10-CM | POA: Diagnosis not present

## 2019-04-16 DIAGNOSIS — R55 Syncope and collapse: Secondary | ICD-10-CM

## 2019-04-16 DIAGNOSIS — I959 Hypotension, unspecified: Secondary | ICD-10-CM | POA: Diagnosis present

## 2019-04-16 DIAGNOSIS — X58XXXA Exposure to other specified factors, initial encounter: Secondary | ICD-10-CM | POA: Diagnosis present

## 2019-04-16 DIAGNOSIS — Z79899 Other long term (current) drug therapy: Secondary | ICD-10-CM | POA: Diagnosis not present

## 2019-04-16 DIAGNOSIS — I10 Essential (primary) hypertension: Secondary | ICD-10-CM | POA: Diagnosis present

## 2019-04-16 DIAGNOSIS — E785 Hyperlipidemia, unspecified: Secondary | ICD-10-CM | POA: Diagnosis present

## 2019-04-16 DIAGNOSIS — T7840XD Allergy, unspecified, subsequent encounter: Secondary | ICD-10-CM | POA: Diagnosis not present

## 2019-04-16 DIAGNOSIS — K529 Noninfective gastroenteritis and colitis, unspecified: Secondary | ICD-10-CM | POA: Diagnosis present

## 2019-04-16 DIAGNOSIS — L509 Urticaria, unspecified: Secondary | ICD-10-CM | POA: Diagnosis present

## 2019-04-16 DIAGNOSIS — E119 Type 2 diabetes mellitus without complications: Secondary | ICD-10-CM | POA: Diagnosis present

## 2019-04-16 DIAGNOSIS — Z833 Family history of diabetes mellitus: Secondary | ICD-10-CM | POA: Diagnosis not present

## 2019-04-16 DIAGNOSIS — G40909 Epilepsy, unspecified, not intractable, without status epilepticus: Secondary | ICD-10-CM | POA: Diagnosis present

## 2019-04-16 DIAGNOSIS — M47816 Spondylosis without myelopathy or radiculopathy, lumbar region: Secondary | ICD-10-CM | POA: Diagnosis present

## 2019-04-16 DIAGNOSIS — T7840XA Allergy, unspecified, initial encounter: Secondary | ICD-10-CM | POA: Diagnosis present

## 2019-04-16 LAB — GASTROINTESTINAL PANEL BY PCR, STOOL (REPLACES STOOL CULTURE)

## 2019-04-16 LAB — CBC WITH DIFFERENTIAL/PLATELET
Abs Immature Granulocytes: 0.01 10*3/uL (ref 0.00–0.07)
Basophils Absolute: 0 10*3/uL (ref 0.0–0.1)
Basophils Relative: 0 %
Eosinophils Absolute: 0 10*3/uL (ref 0.0–0.5)
Eosinophils Relative: 0 %
HCT: 30.8 % — ABNORMAL LOW (ref 36.0–46.0)
Hemoglobin: 9.8 g/dL — ABNORMAL LOW (ref 12.0–15.0)
Immature Granulocytes: 0 %
Lymphocytes Relative: 9 %
Lymphs Abs: 0.6 10*3/uL — ABNORMAL LOW (ref 0.7–4.0)
MCH: 29.9 pg (ref 26.0–34.0)
MCHC: 31.8 g/dL (ref 30.0–36.0)
MCV: 93.9 fL (ref 80.0–100.0)
Monocytes Absolute: 0.7 10*3/uL (ref 0.1–1.0)
Monocytes Relative: 10 %
Neutro Abs: 5.2 10*3/uL (ref 1.7–7.7)
Neutrophils Relative %: 81 %
Platelets: 204 10*3/uL (ref 150–400)
RBC: 3.28 MIL/uL — ABNORMAL LOW (ref 3.87–5.11)
RDW: 12.6 % (ref 11.5–15.5)
WBC: 6.5 10*3/uL (ref 4.0–10.5)
nRBC: 0 % (ref 0.0–0.2)

## 2019-04-16 LAB — COMPREHENSIVE METABOLIC PANEL
ALT: 9 U/L (ref 0–44)
AST: 16 U/L (ref 15–41)
Albumin: 1.8 g/dL — ABNORMAL LOW (ref 3.5–5.0)
Alkaline Phosphatase: 39 U/L (ref 38–126)
Anion gap: 6 (ref 5–15)
BUN: 20 mg/dL (ref 8–23)
CO2: 14 mmol/L — ABNORMAL LOW (ref 22–32)
Calcium: 4.4 mg/dL — CL (ref 8.9–10.3)
Chloride: 122 mmol/L — ABNORMAL HIGH (ref 98–111)
Creatinine, Ser: 0.61 mg/dL (ref 0.44–1.00)
GFR calc Af Amer: >60 mL/min (ref >60)
GFR calc non Af Amer: >60 mL/min (ref >60)
Glucose, Bld: 177 mg/dL — ABNORMAL HIGH (ref 70–99)
Potassium: <2 mmol/L — CL (ref 3.5–5.1)
Sodium: 142 mmol/L (ref 135–145)
Total Bilirubin: 0.5 mg/dL (ref 0.3–1.2)
Total Protein: 3.5 g/dL — ABNORMAL LOW (ref 6.5–8.1)

## 2019-04-16 LAB — TROPONIN I (HIGH SENSITIVITY)
Troponin I (High Sensitivity): 13 ng/L (ref <18)
Troponin I (High Sensitivity): 16 ng/L (ref <18)

## 2019-04-16 LAB — BASIC METABOLIC PANEL
Anion gap: 7 (ref 5–15)
BUN: 24 mg/dL — ABNORMAL HIGH (ref 8–23)
CO2: 22 mmol/L (ref 22–32)
Calcium: 7.6 mg/dL — ABNORMAL LOW (ref 8.9–10.3)
Chloride: 110 mmol/L (ref 98–111)
Creatinine, Ser: 0.67 mg/dL (ref 0.44–1.00)
GFR calc Af Amer: >60 mL/min (ref >60)
GFR calc non Af Amer: >60 mL/min (ref >60)
Glucose, Bld: 211 mg/dL — ABNORMAL HIGH (ref 70–99)
Potassium: 4.7 mmol/L (ref 3.5–5.1)
Sodium: 139 mmol/L (ref 135–145)

## 2019-04-16 LAB — CBC
HCT: 36.2 % (ref 36.0–46.0)
Hemoglobin: 11.8 g/dL — ABNORMAL LOW (ref 12.0–15.0)
MCH: 30.1 pg (ref 26.0–34.0)
MCHC: 32.6 g/dL (ref 30.0–36.0)
MCV: 92.3 fL (ref 80.0–100.0)
Platelets: 224 10*3/uL (ref 150–400)
RBC: 3.92 MIL/uL (ref 3.87–5.11)
RDW: 12.7 % (ref 11.5–15.5)
WBC: 4.7 10*3/uL (ref 4.0–10.5)
nRBC: 0 % (ref 0.0–0.2)

## 2019-04-16 LAB — C DIFFICILE QUICK SCREEN W PCR REFLEX
C Diff antigen: NEGATIVE
C Diff interpretation: NOT DETECTED
C Diff toxin: NEGATIVE

## 2019-04-16 LAB — MAGNESIUM
Magnesium: 0.9 mg/dL — CL (ref 1.7–2.4)
Magnesium: 3 mg/dL — ABNORMAL HIGH (ref 1.7–2.4)

## 2019-04-16 LAB — HIV ANTIBODY (ROUTINE TESTING W REFLEX): HIV Screen 4th Generation wRfx: NONREACTIVE

## 2019-04-16 LAB — RESPIRATORY PANEL BY RT PCR (FLU A&B, COVID)
Influenza A by PCR: NEGATIVE
Influenza B by PCR: NEGATIVE
SARS Coronavirus 2 by RT PCR: NEGATIVE

## 2019-04-16 MED ORDER — MAGNESIUM SULFATE 2 GM/50ML IV SOLN
INTRAVENOUS | Status: AC
  Administered 2019-04-16: 2 g via INTRAVENOUS
  Filled 2019-04-16: qty 50

## 2019-04-16 MED ORDER — LISINOPRIL-HYDROCHLOROTHIAZIDE 20-12.5 MG PO TABS: 1.0000 | ORAL_TABLET | Freq: Every day | ORAL | Status: DC

## 2019-04-16 MED ORDER — MAGNESIUM SULFATE 50 % IJ SOLN: 3.0000 g | Freq: Once | INTRAVENOUS | Status: DC

## 2019-04-16 MED ORDER — TRAZODONE HCL 50 MG PO TABS
25.0000 mg | ORAL_TABLET | Freq: Every evening | ORAL | Status: DC | PRN
  Administered 2019-04-16: 25 mg via ORAL
  Filled 2019-04-16: qty 1

## 2019-04-16 MED ORDER — POTASSIUM CHLORIDE IN NACL 20-0.9 MEQ/L-% IV SOLN
INTRAVENOUS | Status: DC
  Filled 2019-04-16 (×6): qty 1000

## 2019-04-16 MED ORDER — ACETAMINOPHEN 325 MG PO TABS
650.0000 mg | ORAL_TABLET | Freq: Four times a day (QID) | ORAL | Status: DC | PRN
  Filled 2019-04-16: qty 2

## 2019-04-16 MED ORDER — LABETALOL HCL 5 MG/ML IV SOLN: 20.0000 mg | INTRAVENOUS | Status: DC | PRN

## 2019-04-16 MED ORDER — SIMVASTATIN 20 MG PO TABS
20.0000 mg | ORAL_TABLET | Freq: Every day | ORAL | Status: DC
  Administered 2019-04-16: 20 mg via ORAL
  Filled 2019-04-16 (×2): qty 1

## 2019-04-16 MED ORDER — MAGNESIUM SULFATE 2 GM/50ML IV SOLN: 2.0000 g | Freq: Once | INTRAVENOUS | Status: AC

## 2019-04-16 MED ORDER — ONDANSETRON HCL 4 MG/2ML IJ SOLN
4.0000 mg | Freq: Four times a day (QID) | INTRAMUSCULAR | Status: DC | PRN
  Administered 2019-04-16 (×2): 4 mg via INTRAVENOUS
  Filled 2019-04-16 (×2): qty 2

## 2019-04-16 MED ORDER — MAGNESIUM HYDROXIDE 400 MG/5ML PO SUSP: 30.0000 mL | Freq: Every day | ORAL | Status: DC | PRN

## 2019-04-16 MED ORDER — POTASSIUM CHLORIDE 20 MEQ PO PACK
40.0000 meq | PACK | Freq: Once | ORAL | Status: AC
  Administered 2019-04-16: 40 meq via ORAL
  Filled 2019-04-16: qty 2

## 2019-04-16 MED ORDER — MAGNESIUM SULFATE 2 GM/50ML IV SOLN
2.0000 g | Freq: Once | INTRAVENOUS | Status: AC
  Administered 2019-04-16: 2 g via INTRAVENOUS
  Filled 2019-04-16: qty 50

## 2019-04-16 MED ORDER — ONDANSETRON HCL 4 MG PO TABS: 4.0000 mg | ORAL_TABLET | Freq: Four times a day (QID) | ORAL | Status: DC | PRN

## 2019-04-16 MED ORDER — ENOXAPARIN SODIUM 40 MG/0.4ML ~~LOC~~ SOLN
40.0000 mg | SUBCUTANEOUS | Status: DC
  Administered 2019-04-16: 40 mg via SUBCUTANEOUS
  Filled 2019-04-16 (×2): qty 0.4

## 2019-04-16 MED ORDER — SODIUM CHLORIDE 0.9 % IV SOLN: 1.0000 g | Freq: Once | INTRAVENOUS | Status: DC

## 2019-04-16 MED ORDER — ACETAMINOPHEN 650 MG RE SUPP: 650.0000 mg | Freq: Four times a day (QID) | RECTAL | Status: DC | PRN

## 2019-04-16 MED ORDER — DIPHENHYDRAMINE HCL 50 MG/ML IJ SOLN
25.0000 mg | Freq: Once | INTRAMUSCULAR | Status: DC
  Filled 2019-04-16: qty 1

## 2019-04-16 MED ORDER — CALCIUM GLUCONATE-NACL 1-0.675 GM/50ML-% IV SOLN
1.0000 g | Freq: Once | INTRAVENOUS | Status: AC
  Administered 2019-04-16: 01:00:00 1000 mg via INTRAVENOUS
  Filled 2019-04-16: qty 50

## 2019-04-16 MED ORDER — KETOROLAC TROMETHAMINE 15 MG/ML IJ SOLN
15.0000 mg | Freq: Three times a day (TID) | INTRAMUSCULAR | Status: DC | PRN
  Administered 2019-04-16: 15 mg via INTRAVENOUS
  Filled 2019-04-16: qty 1

## 2019-04-16 MED ORDER — POTASSIUM CHLORIDE 10 MEQ/100ML IV SOLN
10.0000 meq | INTRAVENOUS | Status: AC
  Administered 2019-04-16 (×4): 10 meq via INTRAVENOUS
  Filled 2019-04-16 (×5): qty 100

## 2019-04-16 MED ORDER — LISINOPRIL 20 MG PO TABS
20.0000 mg | ORAL_TABLET | Freq: Every day | ORAL | Status: DC
  Administered 2019-04-16 – 2019-04-17 (×2): 20 mg via ORAL
  Filled 2019-04-16 (×2): qty 1

## 2019-04-16 MED ORDER — MAGNESIUM SULFATE 2 GM/50ML IV SOLN: 2.0000 g | Freq: Once | INTRAVENOUS | Status: DC

## 2019-04-16 MED ORDER — ASPIRIN EC 81 MG PO TBEC
81.0000 mg | DELAYED_RELEASE_TABLET | Freq: Every day | ORAL | Status: DC
  Administered 2019-04-16 – 2019-04-17 (×2): 81 mg via ORAL
  Filled 2019-04-16 (×2): qty 1

## 2019-04-16 MED ORDER — CARBAMAZEPINE 200 MG PO TABS
200.0000 mg | ORAL_TABLET | Freq: Three times a day (TID) | ORAL | Status: DC
  Administered 2019-04-16 – 2019-04-17 (×4): 200 mg via ORAL
  Filled 2019-04-16 (×6): qty 1

## 2019-04-16 MED ORDER — HYDROCHLOROTHIAZIDE 12.5 MG PO CAPS
12.5000 mg | ORAL_CAPSULE | Freq: Every day | ORAL | Status: DC
  Administered 2019-04-16 – 2019-04-17 (×2): 12.5 mg via ORAL
  Filled 2019-04-16 (×2): qty 1

## 2019-04-16 MED ORDER — AMLODIPINE BESYLATE 5 MG PO TABS
5.0000 mg | ORAL_TABLET | Freq: Every day | ORAL | Status: DC
  Administered 2019-04-16 – 2019-04-17 (×2): 5 mg via ORAL
  Filled 2019-04-16 (×2): qty 1

## 2019-04-16 MED ORDER — LORAZEPAM 2 MG/ML IJ SOLN: 1.0000 mg | INTRAMUSCULAR | Status: DC | PRN

## 2019-04-16 MED ORDER — VITAMIN D 25 MCG (1000 UNIT) PO TABS
4000.0000 [IU] | ORAL_TABLET | Freq: Every day | ORAL | Status: DC
  Administered 2019-04-16 – 2019-04-17 (×2): 4000 [IU] via ORAL
  Filled 2019-04-16 (×3): qty 4

## 2019-04-16 MED ORDER — POTASSIUM CHLORIDE CRYS ER 20 MEQ PO TBCR
40.0000 meq | EXTENDED_RELEASE_TABLET | Freq: Once | ORAL | Status: AC
  Administered 2019-04-16: 40 meq via ORAL
  Filled 2019-04-16: qty 2

## 2019-04-16 NOTE — Progress Notes (Signed)
[name] NAME: Tamara Mejia    MR#:  [id number]  DATE OF BIRTH:  10/23/1950  SUBJECTIVE:  patient has history of mental retardation and seizures. She is from [facility] group home. Staff from group home present at bedside. History obtained from staff. Patient apparently had acute onset nausea vomiting and diarrhea overnight yesterday. Came in after a syncopal episode found to have low blood pressure and severely low potassium.  Currently eating diet regular. Continues to have small amount of smirky diarrheal stools. No vomiting.  REVIEW OF SYSTEMS:   Review of Systems  Unable to perform ROS: Mental status change   Tolerating Diet:yes Tolerating PT: ambulated well with nurse  DRUG ALLERGIES:  No Known Allergies  VITALS:  Blood pressure (!) 165/78, pulse (!) 106, temperature 98.6 F (37 C), temperature source Oral, resp. rate 20, height 5\' 5"  (1.651 m), weight 84.9 kg, SpO2 100 %.  PHYSICAL EXAMINATION:   Physical Exam  GENERAL:  69 y.o.-year-old patient lying in the bed with no acute distress.  EYES: Pupils equal, round, reactive to light and accommodation. No scleral icterus.   HEENT: Head atraumatic, normocephalic. Oropharynx and nasopharynx clear.  NECK:  Supple, no jugular venous distention. No thyroid enlargement, no tenderness.  LUNGS: Normal breath sounds bilaterally, no wheezing, rales, rhonchi. No use of accessory muscles of respiration.  CARDIOVASCULAR: S1, S2 normal. No murmurs, rubs, or gallops.  ABDOMEN: Soft, nontender, nondistended. Bowel sounds present. No organomegaly or mass.  EXTREMITIES: No cyanosis, clubbing or edema b/l.    NEUROLOGIC: Cranial nerves II through XII are intact. No focal Motor or sensory deficits b/l.   PSYCHIATRIC:  patient is alert and awake. SKIN: No obvious rash, lesion, or ulcer.   LABORATORY PANEL:  CBC Recent Labs  Lab 04/16/19 0534  WBC 4.7  HGB 11.8*  HCT 36.2  PLT  224    Chemistries  Recent Labs  Lab 04/15/19 2251 04/15/19 2251 04/16/19 0534 04/16/19 1243  NA 142   < > 139  --   K <2.0*   < > 4.7  --   CL 122*   < > 110  --   CO2 14*   < > 22  --   GLUCOSE 177*   < > 211*  --   BUN 20   < > 24*  --   CREATININE 0.61   < > 0.67  --   CALCIUM 4.4*   < > 7.6*  --   MG 0.9*   < >  --  3.0*  AST 16  --   --   --   ALT 9  --   --   --   ALKPHOS 39  --   --   --   BILITOT 0.5  --   --   --    < > = values in this interval not displayed.   Cardiac Enzymes No results for input(s): TROPONINI in the last 168 hours. RADIOLOGY:  CT Head Wo Contrast  Result Date: 04/15/2019 CLINICAL DATA:  Change in mental status, syncopal episode EXAM: CT HEAD WITHOUT CONTRAST TECHNIQUE: Contiguous axial images were obtained from the base of the skull through the vertex without intravenous contrast. COMPARISON:  None. FINDINGS: Brain: No evidence of acute territorial infarction, hemorrhage, hydrocephalus,extra-axial collection or mass lesion/mass effect. There is dilatation the ventricles and sulci consistent with age-related atrophy. Low-attenuation changes in the deep white matter consistent with small vessel ischemia.  Probable areas of encephalomalacia involving the bilateral superior parietal lobes. Vascular: No hyperdense vessel or unexpected calcification. Skull: The skull is intact. No fracture or focal lesion identified. Sinuses/Orbits: The visualized paranasal sinuses and mastoid air cells are clear. The orbits and globes intact. Other: None IMPRESSION: No acute intracranial abnormality. 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   On: 04/15/2019 23:33   ASSESSMENT AND PLAN:  Tamara Mejia  is a 69 y.o. Caucasian female with a known history of type 2 diabetes mellitus, dyslipidemia and seizure who presented to the emergency  room with acute onset of 2 episodes of syncope.  She was noted to be postictal after these episodes by EMS and by the time she arrived to the emergency room that has cleared. Patient's blood pressure initially by EMS was in the 80s. Improved after IV fluids.  1.  Acute gastroenteritis with subsequent severe hypokalemia, hypomagnesemia and hypocalcemia. -Symptoms of nausea vomiting diarrhea resolved. Suspect could be food related since patient developed some rash according to the group home staff. -- Resolved -got aggressive replacement of potassium, magnesium and calcium.  Corrected calcium is 6.16. -Hydrated with  IV normal saline. -Stool studies so far showed negative stool C. Difficile. -- Has very minimal diarrheal stools -patient tolerating PO diet  2.  Syncope.  Differential diagnosis would include hypovolemia and orthostasis and given suspected postictal phase witnessed by EMS, this could be seizures especially with severe hypomagnesemia/hypokalemia - on seizures precautions.   -We will continue her Tegretol and place the patient on as needed IV Ativan. --No Seizures witnessed  3.  Hypertension. -will resume  Zestoretic and  Norvasc. -She will be placed on as needed IV labetalol.  4.  Dyslipidemia - continue statin therapy.  5.  DVT prophylaxis. -Subcutaneous Lovenox.    Procedures: none Family communication : discussed with [facility] staff-- Roderic Ovens Consults : Discharge Disposition : to [facility] tomorrow CODE STATUS: full DVT Prophylaxis : Lovenox Barriers to discharge: none  TOTAL TIME TAKING CARE OF THIS PATIENT: *30* minutes.  >50% time spent on counselling and coordination of care  Note: This dictation was prepared with Dragon dictation along with smaller phrase technology. Any transcriptional errors that result from this process are unintentional.  Fritzi Mandes M.D    [facility]   CC: Primary care physician; Kirk Ruths, MDPatient ID: Tamara Mejia, female   DOB: 1950-01-30, 69 y.o.   MRN: [id number]

## 2019-04-16 NOTE — ED Notes (Signed)
Tamara Mejia [phone] call for updates.

## 2019-04-16 NOTE — H&P (Signed)
[place] at [place] NAME: Tamara Mejia    MR#:  [id number]  DATE OF BIRTH:  Sep 10, 1950  DATE OF ADMISSION:  04/15/2019  PRIMARY CARE PHYSICIAN: Kirk Ruths, MD   REQUESTING/REFERRING PHYSICIAN: Gonzella Lex, MD  CHIEF COMPLAINT:   Chief Complaint  Patient presents with  . Altered Mental Status    HISTORY OF PRESENT ILLNESS:  Tamara Mejia  is a 69 y.o. Caucasian female with a known history of type 2 diabetes mellitus, dyslipidemia and seizure who presented to the emergency room with acute onset of 2 episodes of syncope.  She was noted to be postictal after these episodes by EMS and by the time she arrived to the emergency room that has cleared.  She has been having intractable nausea and vomiting with associated watery diarrhea since yesterday and was noted to have hives with urticarial rash all over her body.  No reported new medications, detergents or lotions or soaps.  No fever or chills.  She admitted to mild lower abdominal pain.  No chest pain or dyspnea or cough.  She admitted to dizziness without headache or paresthesias or focal muscle weakness.  No dysuria, oliguria or hematuria or flank pain.  Upon presentation to the emergency room, blood pressure was 140/95 with a pulse of 91 and otherwise normal vital signs.  Labs revealed potassium less than 2 and magnesium 0.9 with a calcium of 4.4 and albumin of 1.8.  High-sensitivity troponin I was 13.  CBC showed anemia with hemoglobin 9.8 and hematocrit 30.8 compared to 12.5 and 37.9 on 12/12/2018.  Urinalysis showed 6-10 WBCs.  Stool studies came back negative for C. difficile.  Urine culture was sent.  Head CT scan revealed areas of prior infarct involving both parietal lobes with findings consistent with age-related atrophy and chronic small vessel ischemia with no acute intracranial abnormalities. EKG showed normal sinus rhythm with a rate of 91.  The patient was given calcium gluconate 1 g IV  as well as potassium chloride 10 mEq IV and 40 mEq p.o., 500 mL IV normal saline bolus, 125 mg of IV Solu-Medrol and 20 mg of IV Pepcid.  The patient will be admitted to a medical monitored bed for further evaluation and management.  PAST MEDICAL HISTORY:   Past Medical History:  Diagnosis Date  . Diabetes ([place])   . High cholesterol   . Seizure ([place])     PAST SURGICAL HISTORY:   Past Surgical History:  Procedure Laterality Date  . BREAST BIOPSY Right    neg  . CHOLECYSTECTOMY    . COLONOSCOPY WITH PROPOFOL N/A 08/27/2015   Procedure: COLONOSCOPY WITH PROPOFOL;  Surgeon: Lucilla Lame, MD;  Location: [facility] ENDOSCOPY;  Service: Endoscopy;  Laterality: N/A;    SOCIAL HISTORY:   Social History   Tobacco Use  . Smoking status: Never Smoker  . Smokeless tobacco: Never Used  Substance Use Topics  . Alcohol use: No    FAMILY HISTORY:   Family History  Problem Relation Age of Onset  . Diabetes Father   . Heart disease Father   . Heart disease Brother   . Breast cancer Cousin     DRUG ALLERGIES:  No Known Allergies  REVIEW OF SYSTEMS:   ROS As per history of present illness. All pertinent systems were reviewed above. Constitutional,  HEENT, cardiovascular, respiratory, GI, GU, musculoskeletal, neuro, psychiatric, endocrine,  integumentary and hematologic systems were reviewed and are otherwise  negative/unremarkable except for positive findings mentioned  above in the HPI.   MEDICATIONS AT HOME:   Prior to Admission medications   Medication Sig Start Date End Date Taking? Authorizing Provider  amLODipine (NORVASC) 5 MG tablet Take 5 mg by mouth daily.   Yes [provider]  aspirin 81 MG EC tablet Take 81 mg by mouth daily. Swallow whole.   Yes [provider]  carbamazepine (TEGRETOL) 200 MG tablet TAKE ONE TABLET BY MOUTH THREE TIMES DAILY. (SEIZURE CONTROL) Patient taking differently: Take 200 mg by mouth 3 (three) times daily.  10/21/17  Yes Dennie Bible, NP  Cholecalciferol (VITAMIN D-3) 1000 UNITS CAPS Take 4,000 Units by mouth daily.    Yes [provider]  lisinopril-hydrochlorothiazide (ZESTORETIC) 20-12.5 MG tablet Take 1 tablet by mouth daily.  07/08/15  Yes [provider]  simvastatin (ZOCOR) 20 MG tablet Take 20 mg by mouth daily.    Yes [provider]      VITAL SIGNS:  Blood pressure (!) 140/95, pulse 91, temperature 98.6 F (37 C), temperature source Axillary, resp. rate 14, height 5\' 2"  (1.575 m), weight 82.6 kg, SpO2 99 %.  PHYSICAL EXAMINATION:  Physical Exam  GENERAL:  69 y.o.-year-old Caucasian female patient lying in the bed with no acute distress.  EYES: Pupils equal, round, reactive to light and accommodation. No scleral icterus. Extraocular muscles intact.  HEENT: Head atraumatic, normocephalic. Oropharynx and nasopharynx clear.  NECK:  Supple, no jugular venous distention. No thyroid enlargement, no tenderness.  LUNGS: Normal breath sounds bilaterally, no wheezing, rales,rhonchi or crepitation. No use of accessory muscles of respiration.  CARDIOVASCULAR: Regular rate and rhythm, S1, S2 normal. No murmurs, rubs, or gallops.  ABDOMEN: Soft, nondistended, nontender. Bowel sounds present. No organomegaly or mass.  EXTREMITIES: No pedal edema, cyanosis, or clubbing.  NEUROLOGIC: Cranial nerves II through XII are intact. Muscle strength 5/5 in all extremities. Sensation intact. Gait not checked.  PSYCHIATRIC: The patient is alert and oriented x 3.  Normal affect and good eye contact. SKIN: She had an urticarial eruption earlier over her trunk including chest and back, abdomen and groin area that has resolved  LABORATORY PANEL:   CBC Recent Labs  Lab 04/15/19 2251  WBC 6.5  HGB 9.8*  HCT 30.8*  PLT 204   ------------------------------------------------------------------------------------------------------------------  Chemistries  Recent Labs  Lab 04/15/19 2251  NA  142  K <2.0*  CL 122*  CO2 14*  GLUCOSE 177*  BUN 20  CREATININE 0.61  CALCIUM 4.4*  MG 0.9*  AST 16  ALT 9  ALKPHOS 39  BILITOT 0.5   ------------------------------------------------------------------------------------------------------------------  Cardiac Enzymes No results for input(s): TROPONINI in the last 168 hours. ------------------------------------------------------------------------------------------------------------------  RADIOLOGY:  CT Head Wo Contrast  Result Date: 04/15/2019 CLINICAL DATA:  Change in mental status, syncopal episode EXAM: CT HEAD WITHOUT CONTRAST TECHNIQUE: Contiguous axial images were obtained from the base of the skull through the vertex without intravenous contrast. COMPARISON:  None. FINDINGS: Brain: No evidence of acute territorial infarction, hemorrhage, hydrocephalus,extra-axial collection or mass lesion/mass effect. There is dilatation the ventricles and sulci consistent with age-related atrophy. Low-attenuation changes in the deep white matter consistent with small vessel ischemia. Probable areas of encephalomalacia involving the bilateral superior parietal lobes. Vascular: No hyperdense vessel or unexpected calcification. Skull: The skull is intact. No fracture or focal lesion identified. Sinuses/Orbits: The visualized paranasal sinuses and mastoid air cells are clear. The orbits and globes intact. Other: None IMPRESSION: No acute intracranial abnormality. 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   On: 04/15/2019 23:33      IMPRESSION AND PLAN:   1.  Acute gastroenteritis with subsequent severe hypokalemia, hypomagnesemia and hypocalcemia. -The patient will be admitted to the medical monitored bed. -We will aggressively replace potassium, magnesium and calcium.  Corrected calcium is 6.16. -The  patient will be hydrated with IV normal saline. -Stool studies so far showed negative stool C. difficile.  2.  Syncope.  Differential diagnosis would include hypovolemia and orthostasis and given suspected postictal phase witnessed by EMS, this could be seizures especially with severe hypomagnesemia. -The patient will be placed on seizures precautions.   -We will continue her Tegretol and place the patient on as needed IV Ativan.  3.  Hypertension. -We will hold her Zestoretic and continue her Norvasc. -She will be placed on as needed IV labetalol.  4.  Dyslipidemia -We will continue statin therapy.  5.  DVT prophylaxis. -Subcutaneous Lovenox.  All the records are reviewed and case discussed with ED provider. The plan of care was discussed in details with the patient (and family). I answered all questions. The patient agreed to proceed with the above mentioned plan. Further management will depend upon hospital course.   CODE STATUS: Full code  TOTAL TIME TAKING CARE OF THIS PATIENT: 55 minutes.    Christel Mormon M.D on 04/16/2019 at 12:50 AM  [facility]   From 7 PM-7 AM, contact night-coverage [email or web address]  CC: Primary care physician; Kirk Ruths, MD   Note: This dictation was prepared with Dragon dictation along with smaller phrase technology. Any transcriptional errors that result from this process are unintentional.

## 2019-04-16 NOTE — Progress Notes (Addendum)
Pt.vomited three times during the shift. MD was made aware. New order received.

## 2019-04-16 NOTE — ED Provider Notes (Signed)
-----------------------------------------   12:20 AM on 04/16/2019 -----------------------------------------   Blood pressure (!) 140/95, pulse 91, temperature 98.6 F (37 C), temperature source Axillary, resp. rate 14, height 5\' 2"  (1.575 m), weight 82.6 kg, SpO2 99 %.  Assuming care from Dr. Joan Mejia of Tamara Mejia is a 69 y.o. female with a chief complaint of Altered Mental Status .    Please refer to H&P by previous MD for further details.  89F with h/o seizure disorder and HTN presenting with two episodes of seizure vs syncope in the setting of vomiting and diarrhea today. Plan to follow up labs, imaging, and admit.   Labs showing severe hypokalemia and hypocalcemia with no EKG changes. IV supplementation initiated. Patient stable on telemetry. Will continue to monitor and get admitted to the hospital.    [place] Performed by: Tamara Mejia  ?  Total critical care time: 35 min  Critical care time was exclusive of separately billable procedures and treating other patients.  Critical care was necessary to treat or prevent imminent or life-threatening deterioration.  Critical care was time spent personally by me on the following activities: development of treatment plan with patient and/or surrogate as well as nursing, discussions with consultants, evaluation of patient's response to treatment, examination of patient, obtaining history from patient or surrogate, ordering and performing treatments and interventions, ordering and review of laboratory studies, ordering and review of radiographic studies, pulse oximetry and Mejia-evaluation of patient's condition.         Tamara Mejia, [place], MD 04/16/19 [phone]

## 2019-04-16 NOTE — ED Provider Notes (Signed)
[facility] Emergency Department Provider Note  ____________________________________________   First MD Initiated Contact with Patient 04/15/19 2248     (approximate)  I have reviewed the triage vital signs and the nursing notes.  History  Chief Complaint Altered Mental Status    HPI Tamara Mejia is a 69 y.o. female with a history of seizures, HLD, DM, resides at [facility] who presents to the emergency department with several complaints.   First, per EMS report, patient had syncopal episode x 2 this evening and altered mental status at her living facility. The syncope/AMS was the reported initial/primary reason her facility activated EMS. On EMS arrival she was reportedly alert but minimally interactive.  On arrival to the ED she is AO x4.  Unclear if the syncopal episodes were seizures or true syncope.  Patient is unsure as well. With EMS BP was 86/52, gave IVF and Benadryl (see below), with improvement in her blood pressure on arrival to the emergency department.  Additionally, patient was noted by EMS to have an urticarial rash to her trunk, back, groin area.  Patient states she was unaware that she had this ongoing rash until the emergency department.  She does note that she has had several episodes of vomiting and diarrhea today.  She denies any new medications, new food ingestions, new lotions or creams.  She denies any history of known anaphylaxis or other allergies.  No tick bites.  No recent antibiotics.  No shortness of breath.   Past Medical Hx Past Medical History:  Diagnosis Date  . Diabetes ([place])   . High cholesterol   . Seizure [facility])     Problem List Patient Active Problem List   Diagnosis Date Noted  . Special screening for malignant neoplasms, colon   . Rectal polyp   . Health care maintenance 07/18/2014  . Severe obesity (BMI 35.0-35.9 with comorbidity) ([place]) 01/16/2014  . DMII (diabetes mellitus, type 2) ([place]) 07/01/2013  . HTN  (hypertension), benign 07/01/2013  . Hyperlipidemia 07/01/2013  . OAB (overactive bladder) 07/01/2013  . Seizure ([place]) 07/01/2013  . Seizure disorder, primary generalized ([place]) 10/04/2012  . Encounter for long-term (current) use of other medications 10/04/2012    Past Surgical Hx Past Surgical History:  Procedure Laterality Date  . BREAST BIOPSY Right    neg  . CHOLECYSTECTOMY    . COLONOSCOPY WITH PROPOFOL N/A 08/27/2015   Procedure: COLONOSCOPY WITH PROPOFOL;  Surgeon: Lucilla Lame, MD;  Location: [facility] ENDOSCOPY;  Service: Endoscopy;  Laterality: N/A;    Medications Prior to Admission medications   Medication Sig Start Date End Date Taking? Authorizing Provider  amLODipine (NORVASC) 5 MG tablet Take 5 mg by mouth daily.    [provider]  aspirin 81 MG EC tablet Take 81 mg by mouth daily. Swallow whole.    [provider]  carbamazepine (TEGRETOL) 200 MG tablet TAKE ONE TABLET BY MOUTH THREE TIMES DAILY. (SEIZURE CONTROL) 10/21/17   Dennie Bible, NP  Cholecalciferol (VITAMIN D-3) 1000 UNITS CAPS Take 1,000 Units by mouth 2 (two) times daily.    [provider]  lisinopril-hydrochlorothiazide (PRINZIDE,ZESTORETIC) 10-12.5 MG tablet  07/08/15   [provider]  simvastatin (ZOCOR) 20 MG tablet Take 20 mg by mouth at bedtime.    [provider]    Allergies Patient has no known allergies.  Family Hx Family History  Problem Relation Age of Onset  . Diabetes Father   . Heart disease Father   . Heart  disease Brother   . Breast cancer Cousin     Social Hx Social History   Tobacco Use  . Smoking status: Never Smoker  . Smokeless tobacco: Never Used  Substance Use Topics  . Alcohol use: No  . Drug use: No     Review of Systems  Constitutional: Negative for fever. Negative for chills. Eyes: Negative for visual changes. ENT: Negative for sore throat. Cardiovascular: Negative for chest pain. Respiratory: Negative for  shortness of breath. Gastrointestinal: Positive for vomiting and diarrhea. Genitourinary: Negative for dysuria. Musculoskeletal: Negative for leg swelling. Skin: Positive for rash. Neurological: Negative for headaches.  Positive for syncope.   Physical Exam  Vital Signs: ED Triage Vitals  Enc Vitals Group     BP 04/15/19 2356 (!) 140/95     Pulse Rate 04/15/19 2356 91     Resp 04/15/19 2356 14     Temp 04/15/19 2356 98.6 F (37 C)     Temp Source 04/15/19 2356 Axillary     SpO2 04/15/19 2356 99 %     Weight 04/15/19 2245 182 lb 1.6 oz (82.6 kg)     Height 04/15/19 2245 5\' 2"  (1.575 m)     Head Circumference --      Peak Flow --      Pain Score --      Pain Loc --      Pain Edu? --      Excl. in [place]? --     Constitutional: Alert and oriented x 4.  Head: Normocephalic. Atraumatic. Eyes: Conjunctivae clear, sclera anicteric. Pupils equal and symmetric. Nose: No masses or lesions. No congestion or rhinorrhea. Mouth/Throat: Wearing mask.  No lip, tongue, uvular swelling. Neck: No stridor. Trachea midline.   Cardiovascular: Normal rate, regular rhythm. Extremities well perfused. Respiratory: Normal respiratory effort.  Lungs CTAB.  No wheezing.  No hypoxia. Gastrointestinal: Soft. Non-distended. Non-tender.  Genitourinary: Deferred. Musculoskeletal: No lower extremity edema. No deformities. Neurologic:  Normal speech and language.  Alert and oriented x 4 on my evaluation.  No gross focal or lateralizing neurologic deficits are appreciated.  Skin: Diffuse urticarial rash to the trunk, including chest and back, abdomen, groin area.  No intraoral or mucosal involvement. Psychiatric: Mood and affect are appropriate for situation.  EKG  Personally reviewed and interpreted by myself.   Rate: 91 Rhythm: sinus Axis: normal Intervals: WNL Q wave III No STEMI    Radiology  CTH  IMPRESSION:  No acute intracranial abnormality.  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 female who presents to the ED for syncope vs seizure, as well as rash/vomiting/diarrhea  Ddx: allergic reaction, syncope 2/2 hypovolemia (in the setting of vomiting and diarrhea today), electrolyte abnormality, seizure, arrhythmia, amongst others  Will plan for labs, imaging, urine, stool studies.  Fluids.  Received Benadryl with EMS, will additionally provide Solu-Medrol and famotidine given concern for potential allergic reaction component. Was hypotensive w/ EMS, but improved on arrival here and no e/o respiratory involvement or further hemodynamic compromise, will hold on Epi for now but low threshold if indicated.    Patient care transferred due to shift change, awaiting labs.  Anticipate admission.  _______________________________   As part of my medical decision making I have reviewed  available labs, radiology tests, reviewed old records.  Final Clinical Impression(s) / ED Diagnosis  Final diagnoses:  Vomiting and diarrhea  Syncope, unspecified syncope type  Allergic reaction, initial encounter       Note:  This document was prepared using Dragon voice recognition software and may include unintentional dictation errors.   Lilia Pro., MD 04/16/19 [phone]

## 2019-04-17 DIAGNOSIS — K529 Noninfective gastroenteritis and colitis, unspecified: Secondary | ICD-10-CM | POA: Diagnosis present

## 2019-04-17 DIAGNOSIS — T7840XD Allergy, unspecified, subsequent encounter: Secondary | ICD-10-CM

## 2019-04-17 LAB — URINE CULTURE: Culture: NO GROWTH

## 2019-04-17 MED ORDER — ONDANSETRON HCL 4 MG PO TABS: 4.0000 mg | ORAL_TABLET | Freq: Three times a day (TID) | ORAL | 0 refills | Status: DC | PRN

## 2019-04-17 MED ORDER — ONDANSETRON HCL 4 MG PO TABS: 4.0000 mg | ORAL_TABLET | Freq: Three times a day (TID) | ORAL | 0 refills | Status: AC | PRN

## 2019-04-17 MED ORDER — LOPERAMIDE HCL 2 MG PO TABS: 2.0000 mg | ORAL_TABLET | Freq: Four times a day (QID) | ORAL | 0 refills | Status: DC | PRN

## 2019-04-17 NOTE — Progress Notes (Signed)
Tamara Mejia A and O x4. VSS. Pt tolerating diet well. No complaints of nausea or vomiting. IV removed intact, prescriptions given. Pt voices understanding of discharge instructions with no further questions. Patient discharged via wheelchair with RN  Allergies as of 04/17/2019   No Known Allergies     Medication List    TAKE these medications   amLODipine 5 MG tablet Commonly known as: NORVASC Take 5 mg by mouth daily.   aspirin 81 MG EC tablet Take 81 mg by mouth daily. Swallow whole.   carbamazepine 200 MG tablet Commonly known as: TEGRETOL TAKE ONE TABLET BY MOUTH THREE TIMES DAILY. (SEIZURE CONTROL) What changed: See the new instructions.   lisinopril-hydrochlorothiazide 20-12.5 MG tablet Commonly known as: ZESTORETIC Take 1 tablet by mouth daily.   loperamide 2 MG tablet Commonly known as: Imodium A-D Take 1 tablet (2 mg total) by mouth 4 (four) times daily as needed for diarrhea or loose stools.   ondansetron 4 MG tablet Commonly known as: Zofran Take 1 tablet (4 mg total) by mouth every 8 (eight) hours as needed for nausea or vomiting.   Vitamin D-3 25 MCG (1000 UT) Caps Take 4,000 Units by mouth daily.   Zocor 20 MG tablet Generic drug: simvastatin Take 20 mg by mouth daily.       Vitals:   04/17/19 0510 04/17/19 1051  BP: 137/64 112/86  Pulse: 96   Resp: 18   Temp: 98.4 F (36.9 C)   SpO2: 95%     Elida Harbin C Karinna Beadles

## 2019-04-17 NOTE — Discharge Summary (Addendum)
Physician Discharge Summary  Tamara Mejia [id number] DOB: March 08, 1950 DOA: 04/15/2019  PCP: Kirk Ruths, MD  Admit date: 04/15/2019 Discharge date: 04/17/2019  Admitted From: Group home Disposition: Group home  Recommendations for Outpatient Follow-up:  Follow-up with PCP in 1 week  Home Health: As per group home Equipment/Devices: None  Discharge Condition fair CODE STATUS: Full code Diet recommendation: regular    Discharge Diagnoses:  Principal Problem:   Acute gastroenteritis  Active Problems:   Syncope, orthostatic   Hypokalemia   Hypocalcemia   Hypomagnesemia   Seizure disorder, primary generalized ([place])   DMII (diabetes mellitus, type 2) (HCC)   Allergic reaction  Brief narrative/HPI 69 year old female with history of diabetes mellitus type 2, controlled, dyslipidemia and seizures resident of group home brought to the ED with acute onset of nausea, vomiting with diarrhea and a syncopal episode and found to have low blood pressure, severe hypokalemia, hypocalcemia and hypomagnesemia. EMS found patient to be in a postictal state and hypotensive with blood pressure 86/52 mmHg. Patient had normal vitals in the ED, blood work showed potassium <2, magnesium 0.9 and calcium of 4.4 with albumin of 1.8.  CBC was unremarkable.  Stool studies was negative for C. difficile and head CT negative for acute findings.  Patient tested negative for COVID-19. Admitted for further management.  Hospital course  Principal problem Acute gastroenteritis, presumed noninfectious Associated with significantly low potassium, magnesium and calcium Replenished electrolytes aggressively.  Stool studies negative for C. difficile and other GI pathogen. Reports having an episode of diarrhea this morning but no further vomiting and tolerating diet. Add as needed Imodium and Zofran for diarrhea and vomiting respectively.  Stable to be discharged back to group home.   Active  problems Syncope Suspect hypovolemia with orthostasis Also cannot rule out postictal state witnessed by EMS. Resume home dose of Tegretol.  No further episodes.  Stable on telemetry.  Urticarial rash per EMS Noted by EMS with a rash on her trunk, back and groin.  Patient unaware of that.  No new medication, food, linens, creams or ointment.  No prior history of anaphylaxis or other allergies, tick bite, recent antibiotic use. Received Benadryl per EMS and appears to have significantly improved on exam this morning.  Essential hypertension Resume home blood pressure meds  Dyslipidemia Continue statin.  Seizure disorder Continue Tegretol.  Levels were therapeutic  Diabetes mellitus type 2, controlled Not on home meds.  CBG was elevated briefly possibly due to IV Solu-Medrol given in ED with concern for anaphylaxis.  Procedure: Head CT Consult: None Disposition: Return to group home   Discharge Instructions   Allergies as of 04/17/2019   No Known Allergies     Medication List    TAKE these medications   amLODipine 5 MG tablet Commonly known as: NORVASC Take 5 mg by mouth daily.   aspirin 81 MG EC tablet Take 81 mg by mouth daily. Swallow whole.   carbamazepine 200 MG tablet Commonly known as: TEGRETOL TAKE ONE TABLET BY MOUTH THREE TIMES DAILY. (SEIZURE CONTROL) What changed: See the new instructions.   lisinopril-hydrochlorothiazide 20-12.5 MG tablet Commonly known as: ZESTORETIC Take 1 tablet by mouth daily.   loperamide 2 MG tablet Commonly known as: Imodium A-D Take 1 tablet (2 mg total) by mouth 4 (four) times daily as needed for diarrhea or loose stools.   ondansetron 4 MG tablet Commonly known as: Zofran Take 1 tablet (4 mg total) by mouth every 8 (eight) hours as needed for  nausea or vomiting.   Vitamin D-3 25 MCG (1000 UT) Caps Take 4,000 Units by mouth daily.   Zocor 20 MG tablet Generic drug: simvastatin Take 20 mg by mouth daily.       Follow-up Information    Kirk Ruths, MD. Schedule an appointment as soon as possible for a visit in 1 week(s).   Specialty: Internal Medicine Contact information: [place] [address] [phone]          No Known Allergies    Procedures/Studies: DG Abd 1 View  Result Date: 04/16/2019 CLINICAL DATA:  Vomiting EXAM: ABDOMEN - 1 VIEW COMPARISON:  None. FINDINGS: Moderately dilated small bowel loops throughout the abdomen up to approximately 4.5 cm diameter. Mild colonic stool. No evidence of pneumatosis or pneumoperitoneum. Cholecystectomy clips are seen in the right upper quadrant of the abdomen. No radiopaque nephrolithiasis. Moderate degenerative changes in the lower lumbar spine. IMPRESSION: Moderately dilated small bowel loops throughout the abdomen, suggestive of a mid to distal small bowel obstruction. Electronically Signed   By: Ilona Sorrel M.D.   On: 04/16/2019 19:33   CT Head Wo Contrast  Result Date: 04/15/2019 CLINICAL DATA:  Change in mental status, syncopal episode EXAM: CT HEAD WITHOUT CONTRAST TECHNIQUE: Contiguous axial images were obtained from the base of the skull through the vertex without intravenous contrast. COMPARISON:  None. FINDINGS: Brain: No evidence of acute territorial infarction, hemorrhage, hydrocephalus,extra-axial collection or mass lesion/mass effect. There is dilatation the ventricles and sulci consistent with age-related atrophy. Low-attenuation changes in the deep white matter consistent with small vessel ischemia. Probable areas of encephalomalacia involving the bilateral superior parietal lobes. Vascular: No hyperdense vessel or unexpected calcification. Skull: The skull is intact. No fracture or focal lesion identified. Sinuses/Orbits: The visualized paranasal sinuses and mastoid air cells are clear. The orbits and globes intact. Other: None IMPRESSION: No acute intracranial abnormality.  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   On: 04/15/2019 23:33       Subjective:  No overnight events.  Reports having an episode of diarrhea this morning.  No nausea or vomiting.  Tolerating diet.  Discharge Exam: Vitals:   04/16/19 2113 04/17/19 0510  BP: (!) 124/53 137/64  Pulse: 97 96  Resp: 17 18  Temp: 99.1 F (37.3 C) 98.4 F (36.9 C)  SpO2: 96% 95%   Vitals:   04/16/19 0539 04/16/19 1413 04/16/19 2113 04/17/19 0510  BP: (!) 114/56 (!) 165/78 (!) 124/53 137/64  Pulse: 85 (!) 106 97 96  Resp: 20  17 18   Temp: 98.6 F (37 C) 98.6 F (37 C) 99.1 F (37.3 C) 98.4 F (36.9 C)  TempSrc: Oral Oral Oral Oral  SpO2: 99% 100% 96% 95%  Weight: 84.9 kg     Height: 5\' 5"  (1.651 m)       General: Not in distress HEENT: Moist mucosa, supple neck Chest: Clear CVs: Normal S1-S2, no murmurs GI: Soft, nondistended or nontender Musculoskeletal: Warm, no edema, urticarial rash over the trunk, back and groin appears to have resolved    The results of significant diagnostics from this hospitalization (including imaging, microbiology, ancillary and laboratory) are listed below for reference.     Microbiology: Recent Results (from the past 240 hour(s))  Urine Culture     Status: None   Collection Time: 04/15/19 10:51  PM   Specimen: Urine, Random  Result Value Ref Range Status   Specimen Description   Final    URINE, RANDOM Performed at [facility], [address], [place], [place] [address]    Special Requests   Final    NONE Performed at [facility], [address], [place], [place] [address]    Culture   Final    NO GROWTH Performed at [facility], [place] [address], [place], [place] [address]    Report Status 04/17/2019 FINAL  Final  C Difficile Quick Screen w PCR reflex     Status: None    Collection Time: 04/15/19 11:13 PM   Specimen: STOOL  Result Value Ref Range Status   C Diff antigen NEGATIVE NEGATIVE Final   C Diff toxin NEGATIVE NEGATIVE Final   C Diff interpretation No C. difficile detected.  Final    Comment: Performed at [facility], [address] [place], [place] [address]  Gastrointestinal Panel by PCR , Stool     Status: None   Collection Time: 04/15/19 11:13 PM  Result Value Ref Range Status   Campylobacter species NOT DETECTED NOT DETECTED Final   Plesimonas shigelloides NOT DETECTED NOT DETECTED Final   Salmonella species NOT DETECTED NOT DETECTED Final   Yersinia enterocolitica NOT DETECTED NOT DETECTED Final   Vibrio species NOT DETECTED NOT DETECTED Final   Vibrio cholerae NOT DETECTED NOT DETECTED Final   Enteroaggregative E coli (EAEC) NOT DETECTED NOT DETECTED Final   Enteropathogenic E coli (EPEC) NOT DETECTED NOT DETECTED Final   Enterotoxigenic E coli (ETEC) NOT DETECTED NOT DETECTED Final   Shiga like toxin producing E coli (STEC) NOT DETECTED NOT DETECTED Final   Shigella/Enteroinvasive E coli (EIEC) NOT DETECTED NOT DETECTED Final   Cryptosporidium NOT DETECTED NOT DETECTED Final   Cyclospora cayetanensis NOT DETECTED NOT DETECTED Final   Entamoeba histolytica NOT DETECTED NOT DETECTED Final   Giardia lamblia NOT DETECTED NOT DETECTED Final   Adenovirus F40/41 NOT DETECTED NOT DETECTED Final   Astrovirus NOT DETECTED NOT DETECTED Final   Norovirus GI/GII NOT DETECTED NOT DETECTED Final   Rotavirus A NOT DETECTED NOT DETECTED Final   Sapovirus (I, II, IV, and V) NOT DETECTED NOT DETECTED Final    Comment: Performed at [facility], [address] [place], [place] [address]  Respiratory Panel by RT PCR (Flu A&B, Covid) - Nasopharyngeal Swab     Status: None   Collection Time: 04/16/19 12:47 AM   Specimen: Nasopharyngeal Swab  Result Value Ref Range Status   SARS Coronavirus 2 by RT PCR NEGATIVE NEGATIVE Final     Comment: (NOTE) SARS-CoV-2 target nucleic acids are NOT DETECTED. The SARS-CoV-2 RNA is generally detectable in upper respiratoy specimens during the acute phase of infection. The lowest concentration of SARS-CoV-2 viral copies this assay can detect is 131 copies/mL. A negative result does not preclude SARS-Cov-2 infection and should not be used as the sole basis for treatment or other patient management decisions. A negative result may occur with  improper specimen collection/handling, submission of specimen other than nasopharyngeal swab, presence of viral mutation(s) within the areas targeted by this assay, and inadequate number of viral copies (<131 copies/mL). A negative result must be combined with clinical observations, patient history, and epidemiological information. The expected result is Negative. Fact Sheet for Patients:  PinkCheek.be Fact Sheet for Healthcare Providers:  GravelBags.it This test is not yet ap proved or cleared by the [place]  States FDA and  has been authorized for detection and/or diagnosis of SARS-CoV-2 by FDA under an Emergency Use Authorization (EUA). This EUA will remain  in effect (meaning this test can be used) for the duration of the COVID-19 declaration under Section 564(b)(1) of the Act, 21 U.S.C. section 360bbb-3(b)(1), unless the authorization is terminated or revoked sooner.    Influenza A by PCR NEGATIVE NEGATIVE Final   Influenza B by PCR NEGATIVE NEGATIVE Final    Comment: (NOTE) The Xpert Xpress SARS-CoV-2/FLU/RSV assay is intended as an aid in  the diagnosis of influenza from Nasopharyngeal swab specimens and  should not be used as a sole basis for treatment. Nasal washings and  aspirates are unacceptable for Xpert Xpress SARS-CoV-2/FLU/RSV  testing. Fact Sheet for Patients: PinkCheek.be Fact Sheet for Healthcare  Providers: GravelBags.it This test is not yet approved or cleared by the [place] FDA and  has been authorized for detection and/or diagnosis of SARS-CoV-2 by  FDA under an Emergency Use Authorization (EUA). This EUA will remain  in effect (meaning this test can be used) for the duration of the  Covid-19 declaration under Section 564(b)(1) of the Act, 21  U.S.C. section 360bbb-3(b)(1), unless the authorization is  terminated or revoked. Performed at [facility], [address] [place], [place] [address]      Labs: BNP (last 3 results) No results for input(s): BNP in the last 8760 hours. Basic Metabolic Panel: Recent Labs  Lab 04/15/19 2251 04/16/19 0534 04/16/19 1243  NA 142 139  --   K <2.0* 4.7  --   CL 122* 110  --   CO2 14* 22  --   GLUCOSE 177* 211*  --   BUN 20 24*  --   CREATININE 0.61 0.67  --   CALCIUM 4.4* 7.6*  --   MG 0.9*  --  3.0*   Liver Function Tests: Recent Labs  Lab 04/15/19 2251  AST 16  ALT 9  ALKPHOS 39  BILITOT 0.5  PROT 3.5*  ALBUMIN 1.8*   No results for input(s): LIPASE, AMYLASE in the last 168 hours. No results for input(s): AMMONIA in the last 168 hours. CBC: Recent Labs  Lab 04/15/19 2251 04/16/19 0534  WBC 6.5 4.7  NEUTROABS 5.2  --   HGB 9.8* 11.8*  HCT 30.8* 36.2  MCV 93.9 92.3  PLT 204 224   Cardiac Enzymes: No results for input(s): CKTOTAL, CKMB, CKMBINDEX, TROPONINI in the last 168 hours. BNP: Invalid input(s): POCBNP CBG: No results for input(s): GLUCAP in the last 168 hours. D-Dimer No results for input(s): DDIMER in the last 72 hours. Hgb A1c No results for input(s): HGBA1C in the last 72 hours. Lipid Profile No results for input(s): CHOL, HDL, LDLCALC, TRIG, CHOLHDL, LDLDIRECT in the last 72 hours. Thyroid function studies No results for input(s): TSH, T4TOTAL, T3FREE, THYROIDAB in the last 72 hours.  Invalid input(s): FREET3 Anemia work up No results for  input(s): VITAMINB12, FOLATE, FERRITIN, TIBC, IRON, RETICCTPCT in the last 72 hours. Urinalysis    Component Value Date/Time   COLORURINE AMBER (A) 04/15/2019 2251   APPEARANCEUR CLOUDY (A) 04/15/2019 2251   LABSPEC 1.025 04/15/2019 2251   PHURINE 5.0 04/15/2019 2251   GLUCOSEU 50 (A) 04/15/2019 2251   HGBUR NEGATIVE 04/15/2019 2251   BILIRUBINUR SMALL (A) 04/15/2019 2251   KETONESUR NEGATIVE 04/15/2019 2251   PROTEINUR 100 (A) 04/15/2019 2251   NITRITE NEGATIVE 04/15/2019 2251   LEUKOCYTESUR TRACE (A) 04/15/2019 2251  Sepsis Labs Invalid input(s): PROCALCITONIN,  WBC,  LACTICIDVEN Microbiology Recent Results (from the past 240 hour(s))  Urine Culture     Status: None   Collection Time: 04/15/19 10:51 PM   Specimen: Urine, Random  Result Value Ref Range Status   Specimen Description   Final    URINE, RANDOM Performed at [facility], [address], [place], [place] [address]    Special Requests   Final    NONE Performed at [facility], [address] [place], [place] [address]    Culture   Final    NO GROWTH Performed at [facility], [place] [address], [place], [place] [address]    Report Status 04/17/2019 FINAL  Final  C Difficile Quick Screen w PCR reflex     Status: None   Collection Time: 04/15/19 11:13 PM   Specimen: STOOL  Result Value Ref Range Status   C Diff antigen NEGATIVE NEGATIVE Final   C Diff toxin NEGATIVE NEGATIVE Final   C Diff interpretation No C. difficile detected.  Final    Comment: Performed at [facility], [address] [place], [place] [address]  Gastrointestinal Panel by PCR , Stool     Status: None   Collection Time: 04/15/19 11:13 PM  Result Value Ref Range Status   Campylobacter species NOT DETECTED NOT DETECTED Final   Plesimonas shigelloides NOT DETECTED NOT DETECTED Final   Salmonella species NOT DETECTED NOT DETECTED Final   Yersinia enterocolitica NOT DETECTED NOT DETECTED Final   Vibrio  species NOT DETECTED NOT DETECTED Final   Vibrio cholerae NOT DETECTED NOT DETECTED Final   Enteroaggregative E coli (EAEC) NOT DETECTED NOT DETECTED Final   Enteropathogenic E coli (EPEC) NOT DETECTED NOT DETECTED Final   Enterotoxigenic E coli (ETEC) NOT DETECTED NOT DETECTED Final   Shiga like toxin producing E coli (STEC) NOT DETECTED NOT DETECTED Final   Shigella/Enteroinvasive E coli (EIEC) NOT DETECTED NOT DETECTED Final   Cryptosporidium NOT DETECTED NOT DETECTED Final   Cyclospora cayetanensis NOT DETECTED NOT DETECTED Final   Entamoeba histolytica NOT DETECTED NOT DETECTED Final   Giardia lamblia NOT DETECTED NOT DETECTED Final   Adenovirus F40/41 NOT DETECTED NOT DETECTED Final   Astrovirus NOT DETECTED NOT DETECTED Final   Norovirus GI/GII NOT DETECTED NOT DETECTED Final   Rotavirus A NOT DETECTED NOT DETECTED Final   Sapovirus (I, II, IV, and V) NOT DETECTED NOT DETECTED Final    Comment: Performed at [facility], [address] [place], [place] [address]  Respiratory Panel by RT PCR (Flu A&B, Covid) - Nasopharyngeal Swab     Status: None   Collection Time: 04/16/19 12:47 AM   Specimen: Nasopharyngeal Swab  Result Value Ref Range Status   SARS Coronavirus 2 by RT PCR NEGATIVE NEGATIVE Final    Comment: (NOTE) SARS-CoV-2 target nucleic acids are NOT DETECTED. The SARS-CoV-2 RNA is generally detectable in upper respiratoy specimens during the acute phase of infection. The lowest concentration of SARS-CoV-2 viral copies this assay can detect is 131 copies/mL. A negative result does not preclude SARS-Cov-2 infection and should not be used as the sole basis for treatment or other patient management decisions. A negative result may occur with  improper specimen collection/handling, submission of specimen other than nasopharyngeal swab, presence of viral mutation(s) within the areas targeted by this assay, and inadequate number of viral copies (<131 copies/mL). A  negative result must be combined with clinical observations, patient history, and  epidemiological information. The expected result is Negative. Fact Sheet for Patients:  PinkCheek.be Fact Sheet for Healthcare Providers:  GravelBags.it This test is not yet ap proved or cleared by the [place] FDA and  has been authorized for detection and/or diagnosis of SARS-CoV-2 by FDA under an Emergency Use Authorization (EUA). This EUA will remain  in effect (meaning this test can be used) for the duration of the COVID-19 declaration under Section 564(b)(1) of the Act, 21 U.S.C. section 360bbb-3(b)(1), unless the authorization is terminated or revoked sooner.    Influenza A by PCR NEGATIVE NEGATIVE Final   Influenza B by PCR NEGATIVE NEGATIVE Final    Comment: (NOTE) The Xpert Xpress SARS-CoV-2/FLU/RSV assay is intended as an aid in  the diagnosis of influenza from Nasopharyngeal swab specimens and  should not be used as a sole basis for treatment. Nasal washings and  aspirates are unacceptable for Xpert Xpress SARS-CoV-2/FLU/RSV  testing. Fact Sheet for Patients: PinkCheek.be Fact Sheet for Healthcare Providers: GravelBags.it This test is not yet approved or cleared by the [place] FDA and  has been authorized for detection and/or diagnosis of SARS-CoV-2 by  FDA under an Emergency Use Authorization (EUA). This EUA will remain  in effect (meaning this test can be used) for the duration of the  Covid-19 declaration under Section 564(b)(1) of the Act, 21  U.S.C. section 360bbb-3(b)(1), unless the authorization is  terminated or revoked. Performed at [facility], [address], [place], [place] [address]      Time coordinating discharge: 35 minutes  SIGNED:   Louellen Molder, MD  [facility] 04/17/2019, 10:40 AM Pager   If 7PM-7AM, please  contact night-coverage [email or web address] Password [email or web address]

## 2019-12-21 ENCOUNTER — Ambulatory Visit: Payer: Medicare Other | Admitting: Adult Health

## 2019-12-27 NOTE — Progress Notes (Signed)
PATIENT: Tamara Mejia DOB: March 22, 1950  REASON FOR VISIT: follow up HISTORY FROM: patient  HISTORY OF PRESENT ILLNESS: Today 12/27/19:  Tamara Mejia is a 69 year old female with a history of mental retardation and seizures.  She returns today for follow-up.  Remains on carbamazepine 200 mg 3 times daily.  Continues to reside in a group home.  She is here today with a caregiver.  Denies any seizure events.  No change in her gait or balance.  No change in mood or behavior.  She returns today for an evaluation.  12/12/18: Tamara Mejia is a 69 year old female with a history of mental retardation and seizures.  She returns today for follow-up.  Overall she is doing well.  She denies any seizure events.  She continues on carbamazepine 200 mg 3 times a day.  She continues to live at a group home.  She is able to complete all ADLs independently.  She does not cook there.  She denies any changes with her gait or balance.  She returns today for an evaluation.  HISTORY 12/06/2017:  Tamara Mejia 69 year old female returns for follow-up with history of generalized seizure disorder.  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  Last seizure was in 2000.  She currently resides in a group home.  She goes to a day program.  She is independent with activities of daily living.  She is currently on Tegretol 203 times daily without side effects.  No falls no balance issues.  CBC and CMP reviewed in care everywhere.  She returns for reevaluation  REVIEW OF SYSTEMS: Out of a complete 14 system review of symptoms, the patient complains only of the following symptoms, and all other reviewed systems are negative.  See HPI  ALLERGIES: No Known Allergies  HOME MEDICATIONS: Outpatient Medications Prior to Visit  Medication Sig Dispense Refill  . amLODipine (NORVASC) 5 MG tablet Take 5 mg by mouth daily.    Marland Kitchen aspirin 81 MG EC tablet Take 81 mg by mouth daily. Swallow whole.    .  carbamazepine (TEGRETOL) 200 MG tablet TAKE ONE TABLET BY MOUTH THREE TIMES DAILY. (SEIZURE CONTROL) (Patient taking differently: Take 200 mg by mouth 3 (three) times daily. ) 90 tablet 11  . Cholecalciferol (VITAMIN D-3) 1000 UNITS CAPS Take 4,000 Units by mouth daily.     Marland Kitchen lisinopril-hydrochlorothiazide (ZESTORETIC) 20-12.5 MG tablet Take 1 tablet by mouth daily.     Marland Kitchen loperamide (IMODIUM A-D) 2 MG tablet Take 1 tablet (2 mg total) by mouth 4 (four) times daily as needed for diarrhea or loose stools. 20 tablet 0  . ondansetron (ZOFRAN) 4 MG tablet Take 1 tablet (4 mg total) by mouth every 8 (eight) hours as needed for nausea or vomiting. 15 tablet 0  . simvastatin (ZOCOR) 20 MG tablet Take 20 mg by mouth daily.      No facility-administered medications prior to visit.    PAST MEDICAL HISTORY: Past Medical History:  Diagnosis Date  . Diabetes ([place])   . High cholesterol   . Seizure ([place])     PAST SURGICAL HISTORY: Past Surgical History:  Procedure Laterality Date  . BREAST BIOPSY Right    neg  . CHOLECYSTECTOMY    . COLONOSCOPY WITH PROPOFOL N/A 08/27/2015   Procedure: COLONOSCOPY WITH PROPOFOL;  Surgeon: Lucilla Lame, MD;  Location: [facility] ENDOSCOPY;  Service: Endoscopy;  Laterality: N/A;    FAMILY HISTORY: Family History  Problem Relation Age of Onset  .  Diabetes Father   . Heart disease Father   . Heart disease Brother   . Breast cancer Cousin     SOCIAL HISTORY: Social History   Socioeconomic History  . Marital status: Single    Spouse name: Not on file  . Number of children: Not on file  . Years of education: Not on file  . Highest education level: Not on file  Occupational History  . Not on file  Tobacco Use  . Smoking status: Never Smoker  . Smokeless tobacco: Never Used  Substance and Sexual Activity  . Alcohol use: No  . Drug use: No  . Sexual activity: Not on file  Other Topics Concern  . Not on file  Social History Narrative   Patient works for cutting  board   Patient has her adult education certificate   Patient drinks about 2 cups of coffee daily.          Social Determinants of Health   Financial Resource Strain:   . Difficulty of Paying Living Expenses: Not on file  Food Insecurity:   . Worried About Charity fundraiser in the Last Year: Not on file  . Ran Out of Food in the Last Year: Not on file  Transportation Needs:   . Lack of Transportation (Medical): Not on file  . Lack of Transportation (Non-Medical): Not on file  Physical Activity:   . Days of Exercise per Week: Not on file  . Minutes of Exercise per Session: Not on file  Stress:   . Feeling of Stress : Not on file  Social Connections:   . Frequency of Communication with Friends and Family: Not on file  . Frequency of Social Gatherings with Friends and Family: Not on file  . Attends Religious Services: Not on file  . Active Member of Clubs or Organizations: Not on file  . Attends Archivist Meetings: Not on file  . Marital Status: Not on file  Intimate Partner Violence:   . Fear of Current or Ex-Partner: Not on file  . Emotionally Abused: Not on file  . Physically Abused: Not on file  . Sexually Abused: Not on file      PHYSICAL EXAM  Vitals:   12/28/19 0941  BP: (!) 151/83  Pulse: 74  Weight: 187 lb 8 oz (85 kg)  Height: 5\' 5"  (1.651 m)   Body mass index is 31.2 kg/m.  Generalized: Well developed, in no acute distress   Neurological examination  Mentation: Alert oriented to time, place, history taking. Follows all commands speech and language fluent Cranial nerve II-XII: Pupils were equal round reactive to light. Extraocular movements were full, visual field were full on confrontational test.  Head turning and shoulder shrug  were normal and symmetric. Motor: The motor testing reveals 5 over 5 strength of all 4 extremities. Good symmetric motor tone is noted throughout.  Sensory: Sensory testing is intact to soft touch on all 4  extremities. No evidence of extinction is noted.  Coordination: Cerebellar testing reveals good finger-nose-finger and heel-to-shin bilaterally.  Gait and station: Gait is normal.    DIAGNOSTIC DATA (LABS, IMAGING, TESTING) - I reviewed patient records, labs, notes, testing and imaging myself where available.  Lab Results  Component Value Date   WBC 4.7 04/16/2019   HGB 11.8 (L) 04/16/2019   HCT 36.2 04/16/2019   MCV 92.3 04/16/2019   PLT 224 04/16/2019      Component Value Date/Time   NA 139 04/16/2019 0534  NA 141 12/12/2018 0942   NA 139 12/22/2011 2245   K 4.7 04/16/2019 0534   K 3.8 12/22/2011 2245   CL 110 04/16/2019 0534   CL 104 12/22/2011 2245   CO2 22 04/16/2019 0534   CO2 29 12/22/2011 2245   GLUCOSE 211 (H) 04/16/2019 0534   GLUCOSE 100 (H) 12/22/2011 2245   BUN 24 (H) 04/16/2019 0534   BUN 14 12/12/2018 0942   BUN 18 12/22/2011 2245   CREATININE 0.67 04/16/2019 0534   CREATININE 0.81 12/22/2011 2245   CALCIUM 7.6 (L) 04/16/2019 0534   CALCIUM 9.1 12/22/2011 2245   PROT 3.5 (L) 04/15/2019 2251   PROT 6.9 12/12/2018 0942   PROT 7.6 12/22/2011 2245   ALBUMIN 1.8 (L) 04/15/2019 2251   ALBUMIN 4.2 12/12/2018 0942   ALBUMIN 3.8 12/22/2011 2245   AST 16 04/15/2019 2251   AST 34 12/22/2011 2245   ALT 9 04/15/2019 2251   ALT 33 12/22/2011 2245   ALKPHOS 39 04/15/2019 2251   ALKPHOS 83 12/22/2011 2245   BILITOT 0.5 04/15/2019 2251   BILITOT <0.2 12/12/2018 0942   BILITOT 0.2 12/22/2011 2245   GFRNONAA >60 04/16/2019 0534   GFRNONAA >60 12/22/2011 2245   GFRAA >60 04/16/2019 0534   GFRAA >60 12/22/2011 2245      ASSESSMENT AND PLAN 69 y.o. year old female  has a past medical history of Diabetes ([place]), High cholesterol, and Seizure ([place]). here with:  1.  Seizures  -Continue carbamazepine 200 mg 3 times a day -Blood work today-CBC, CMP and carbamazepine level  Overall the patient is remained stable.  Advised that if she has any seizure event she  should let us know.  She will follow-up in 1 year or sooner if needed.   I spent 25 minutes of face-to-face and non-face-to-face time with patient.  This included previsit chart review, lab review, study review, order entry, electronic health record documentation, patient education.   Ward Givens, MSN, NP-C 12/27/2019, 4:50 PM [facility] [address] [place], [place] [address] [phone]

## 2019-12-28 ENCOUNTER — Ambulatory Visit (INDEPENDENT_AMBULATORY_CARE_PROVIDER_SITE_OTHER): Payer: Medicare Other | Admitting: Adult Health

## 2019-12-28 ENCOUNTER — Encounter: Payer: Self-pay | Admitting: Adult Health

## 2019-12-28 VITALS — BP 151/83 | HR 74 | Ht 65.0 in | Wt 187.5 lb

## 2019-12-28 DIAGNOSIS — G40309 Generalized idiopathic epilepsy and epileptic syndromes, not intractable, without status epilepticus: Secondary | ICD-10-CM | POA: Diagnosis not present

## 2019-12-28 DIAGNOSIS — Z5181 Encounter for therapeutic drug level monitoring: Secondary | ICD-10-CM | POA: Diagnosis not present

## 2019-12-28 NOTE — Patient Instructions (Signed)
Your Plan:  Continue carbamazepine 200 mg 3 times a day Blood work today If your symptoms worsen or you develop new symptoms please let us know.    Thank you for coming to see [place] at [facility]. I hope we have been able to provide you high quality care today.  You may receive a patient satisfaction survey over the next few weeks. We would appreciate your feedback and comments so that we may continue to improve ourselves and the health of our patients.

## 2019-12-29 LAB — COMPREHENSIVE METABOLIC PANEL
ALT: 17 IU/L (ref 0–32)
AST: 20 IU/L (ref 0–40)
Albumin/Globulin Ratio: 1.9 (ref 1.2–2.2)
Albumin: 4.6 g/dL (ref 3.8–4.8)
Alkaline Phosphatase: 84 IU/L (ref 44–121)
BUN/Creatinine Ratio: 20 (ref 12–28)
BUN: 15 mg/dL (ref 8–27)
Bilirubin Total: <0.2 mg/dL (ref 0.0–1.2)
CO2: 27 mmol/L (ref 20–29)
Calcium: 9.4 mg/dL (ref 8.7–10.3)
Chloride: 99 mmol/L (ref 96–106)
Creatinine, Ser: 0.75 mg/dL (ref 0.57–1.00)
GFR calc Af Amer: 95 mL/min/{1.73_m2} (ref >59)
GFR calc non Af Amer: 83 mL/min/{1.73_m2} (ref >59)
Globulin, Total: 2.4 g/dL (ref 1.5–4.5)
Glucose: 115 mg/dL — ABNORMAL HIGH (ref 65–99)
Potassium: 4.2 mmol/L (ref 3.5–5.2)
Sodium: 140 mmol/L (ref 134–144)
Total Protein: 7 g/dL (ref 6.0–8.5)

## 2019-12-29 LAB — CBC WITH DIFFERENTIAL/PLATELET
Basophils Absolute: 0.1 10*3/uL (ref 0.0–0.2)
Basos: 1 %
EOS (ABSOLUTE): 0.1 10*3/uL (ref 0.0–0.4)
Eos: 2 %
Hematocrit: 38.3 % (ref 34.0–46.6)
Hemoglobin: 12.9 g/dL (ref 11.1–15.9)
Immature Grans (Abs): 0 10*3/uL (ref 0.0–0.1)
Immature Granulocytes: 0 %
Lymphocytes Absolute: 1.5 10*3/uL (ref 0.7–3.1)
Lymphs: 33 %
MCH: 30.1 pg (ref 26.6–33.0)
MCHC: 33.7 g/dL (ref 31.5–35.7)
MCV: 90 fL (ref 79–97)
Monocytes Absolute: 0.5 10*3/uL (ref 0.1–0.9)
Monocytes: 11 %
Neutrophils Absolute: 2.4 10*3/uL (ref 1.4–7.0)
Neutrophils: 53 %
Platelets: 210 10*3/uL (ref 150–450)
RBC: 4.28 x10E6/uL (ref 3.77–5.28)
RDW: 12.6 % (ref 11.7–15.4)
WBC: 4.5 10*3/uL (ref 3.4–10.8)

## 2019-12-29 LAB — CARBAMAZEPINE LEVEL, TOTAL: Carbamazepine (Tegretol), S: 8.9 ug/mL (ref 4.0–12.0)

## 2020-01-02 ENCOUNTER — Telehealth: Payer: Self-pay

## 2020-01-02 NOTE — Telephone Encounter (Signed)
Pt verified by name and DOB,  results given to group home, per provider, voiced understanding all question answered.

## 2020-01-02 NOTE — Telephone Encounter (Signed)
-----   Message from Ward Givens, NP sent at 01/01/2020  4:07 PM EST ----- Labs results are unremarkable. Please call patient with results.

## 2020-02-22 NOTE — Progress Notes (Signed)
I have reviewed and agreed above plan. 

## 2020-08-23 ENCOUNTER — Other Ambulatory Visit: Payer: Self-pay | Admitting: Internal Medicine

## 2020-08-23 DIAGNOSIS — Z1231 Encounter for screening mammogram for malignant neoplasm of breast: Secondary | ICD-10-CM

## 2020-09-05 ENCOUNTER — Other Ambulatory Visit: Payer: Self-pay

## 2020-09-05 ENCOUNTER — Ambulatory Visit
Admission: RE | Admit: 2020-09-05 | Discharge: 2020-09-05 | Disposition: A | Discharge status: Home / Self Care | Payer: Medicare Other | Source: Ambulatory Visit | Attending: Internal Medicine | Admitting: Internal Medicine

## 2020-09-05 DIAGNOSIS — Z1231 Encounter for screening mammogram for malignant neoplasm of breast: Secondary | ICD-10-CM | POA: Insufficient documentation

## 2020-09-10 ENCOUNTER — Other Ambulatory Visit: Payer: Self-pay | Admitting: Internal Medicine

## 2020-09-10 DIAGNOSIS — N6320 Unspecified lump in the left breast, unspecified quadrant: Secondary | ICD-10-CM

## 2020-09-10 DIAGNOSIS — R928 Other abnormal and inconclusive findings on diagnostic imaging of breast: Secondary | ICD-10-CM

## 2020-09-19 ENCOUNTER — Ambulatory Visit: Payer: Medicare Other

## 2020-09-19 ENCOUNTER — Other Ambulatory Visit: Payer: Medicare Other

## 2020-09-26 ENCOUNTER — Other Ambulatory Visit: Payer: Self-pay

## 2020-09-26 ENCOUNTER — Ambulatory Visit
Admission: RE | Admit: 2020-09-26 | Discharge: 2020-09-26 | Disposition: A | Discharge status: Home / Self Care | Payer: Medicare Other | Source: Ambulatory Visit | Attending: Internal Medicine | Admitting: Internal Medicine

## 2020-09-26 DIAGNOSIS — R928 Other abnormal and inconclusive findings on diagnostic imaging of breast: Secondary | ICD-10-CM

## 2020-09-26 DIAGNOSIS — N6320 Unspecified lump in the left breast, unspecified quadrant: Secondary | ICD-10-CM

## 2020-09-30 ENCOUNTER — Other Ambulatory Visit: Payer: Self-pay | Admitting: Internal Medicine

## 2020-09-30 DIAGNOSIS — R928 Other abnormal and inconclusive findings on diagnostic imaging of breast: Secondary | ICD-10-CM

## 2020-09-30 DIAGNOSIS — N6320 Unspecified lump in the left breast, unspecified quadrant: Secondary | ICD-10-CM

## 2020-10-08 ENCOUNTER — Ambulatory Visit
Admission: RE | Admit: 2020-10-08 | Discharge: 2020-10-08 | Disposition: A | Discharge status: Home / Self Care | Payer: Medicare Other | Source: Ambulatory Visit | Attending: Internal Medicine | Admitting: Internal Medicine

## 2020-10-08 ENCOUNTER — Other Ambulatory Visit: Payer: Self-pay

## 2020-10-08 DIAGNOSIS — N6320 Unspecified lump in the left breast, unspecified quadrant: Secondary | ICD-10-CM | POA: Diagnosis present

## 2020-10-08 DIAGNOSIS — R928 Other abnormal and inconclusive findings on diagnostic imaging of breast: Secondary | ICD-10-CM | POA: Diagnosis not present

## 2020-10-14 LAB — SURGICAL PATHOLOGY

## 2020-10-18 HISTORY — PX: BREAST BIOPSY: SHX20

## 2020-10-23 DIAGNOSIS — C50919 Malignant neoplasm of unspecified site of unspecified female breast: Secondary | ICD-10-CM

## 2020-10-27 NOTE — Progress Notes (Signed)
Navigation initiated.  Consult appointments scheduled with Dr. Tasia Catchings, and Dr. Windell Moment.  Notified Group home of upcoming appoint dates/time.

## 2020-10-28 ENCOUNTER — Other Ambulatory Visit: Payer: Self-pay

## 2020-10-28 ENCOUNTER — Inpatient Hospital Stay: Payer: Medicare Other

## 2020-10-28 ENCOUNTER — Encounter: Payer: Self-pay | Admitting: Oncology

## 2020-10-28 ENCOUNTER — Inpatient Hospital Stay: Payer: Medicare Other | Attending: Oncology | Admitting: Oncology

## 2020-10-28 VITALS — BP 145/67 | HR 82 | Temp 98.5°F | Resp 17 | Ht 63.6 in | Wt 193.6 lb

## 2020-10-28 DIAGNOSIS — Z170 Estrogen receptor positive status [ER+]: Secondary | ICD-10-CM | POA: Insufficient documentation

## 2020-10-28 DIAGNOSIS — Z803 Family history of malignant neoplasm of breast: Secondary | ICD-10-CM | POA: Insufficient documentation

## 2020-10-28 DIAGNOSIS — C50912 Malignant neoplasm of unspecified site of left female breast: Secondary | ICD-10-CM | POA: Insufficient documentation

## 2020-10-28 DIAGNOSIS — C50412 Malignant neoplasm of upper-outer quadrant of left female breast: Secondary | ICD-10-CM | POA: Insufficient documentation

## 2020-10-28 DIAGNOSIS — C50919 Malignant neoplasm of unspecified site of unspecified female breast: Secondary | ICD-10-CM | POA: Insufficient documentation

## 2020-10-28 DIAGNOSIS — Z7189 Other specified counseling: Secondary | ICD-10-CM

## 2020-10-28 LAB — CBC WITH DIFFERENTIAL/PLATELET
Abs Immature Granulocytes: 0.02 10*3/uL (ref 0.00–0.07)
Basophils Absolute: 0.1 10*3/uL (ref 0.0–0.1)
Basophils Relative: 1 %
Eosinophils Absolute: 0.2 10*3/uL (ref 0.0–0.5)
Eosinophils Relative: 4 %
HCT: 39.7 % (ref 36.0–46.0)
Hemoglobin: 13.5 g/dL (ref 12.0–15.0)
Immature Granulocytes: 0 %
Lymphocytes Relative: 36 %
Lymphs Abs: 1.7 10*3/uL (ref 0.7–4.0)
MCH: 31.3 pg (ref 26.0–34.0)
MCHC: 34 g/dL (ref 30.0–36.0)
MCV: 91.9 fL (ref 80.0–100.0)
Monocytes Absolute: 0.4 10*3/uL (ref 0.1–1.0)
Monocytes Relative: 9 %
Neutro Abs: 2.4 10*3/uL (ref 1.7–7.7)
Neutrophils Relative %: 50 %
Platelets: 235 10*3/uL (ref 150–400)
RBC: 4.32 MIL/uL (ref 3.87–5.11)
RDW: 12.4 % (ref 11.5–15.5)
WBC: 4.7 10*3/uL (ref 4.0–10.5)
nRBC: 0 % (ref 0.0–0.2)

## 2020-10-28 LAB — COMPREHENSIVE METABOLIC PANEL
ALT: 23 U/L (ref 0–44)
AST: 23 U/L (ref 15–41)
Albumin: 4.3 g/dL (ref 3.5–5.0)
Alkaline Phosphatase: 75 U/L (ref 38–126)
Anion gap: 6 (ref 5–15)
BUN: 14 mg/dL (ref 8–23)
CO2: 31 mmol/L (ref 22–32)
Calcium: 8.9 mg/dL (ref 8.9–10.3)
Chloride: 99 mmol/L (ref 98–111)
Creatinine, Ser: 0.71 mg/dL (ref 0.44–1.00)
GFR, Estimated: >60 mL/min (ref >60)
Glucose, Bld: 164 mg/dL — ABNORMAL HIGH (ref 70–99)
Potassium: 4 mmol/L (ref 3.5–5.1)
Sodium: 136 mmol/L (ref 135–145)
Total Bilirubin: <0.1 mg/dL — ABNORMAL LOW (ref 0.3–1.2)
Total Protein: 7.8 g/dL (ref 6.5–8.1)

## 2020-10-28 NOTE — Progress Notes (Signed)
Pt resides at [facility]. Has been there greater than 30 years. Mammogram found her breast cancer. Had 1 biopsy left breast. Great appetite. Energy is fair. No hot flashes. Regular bowel movements. Pt knows she is here for  a new diagnosis of breast cancer today. Legal guardian is Geraldo Pitter a cousin who is also been newly diagnosed with breast cancer. Pt is accompanied by caregiver from [employer]. Pt is alert and was able to answer questions.

## 2020-10-28 NOTE — Progress Notes (Signed)
Accompanied patient and caregiver to initial Med/Onc visit with Dr. Tasia Catchings.  Given Breast Cancer Treatment Handbook.  Caregiver to call back with surgery date after meeting with Dr. Peyton Najjar.

## 2020-10-28 NOTE — Progress Notes (Signed)
Hematology/Oncology Consult note [facility] Telephone:([phone] [phone]   Patient Care Team: Kirk Ruths, MD as PCP - General (Internal Medicine)  REFERRING PROVIDER: Kirk Ruths, MD  CHIEF COMPLAINTS/REASON FOR VISIT:  Evaluation of left breast cancer  HISTORY OF PRESENTING ILLNESS:   Tamara Mejia is a  70 y.o.  female with PMH listed below was seen in consultation at the request of  Kirk Ruths, MD  for evaluation of left breast cancer.  She has screening mammogram done which recommends additional left breast diagnostic mammogram.  09/26/2020 unilateral left diagnostic mammogram showed there is a suspicious 17 mm mass in the LEFT upper breast which is concerning for malignancy. Recommend ultrasound-guided biopsy for definitive characterization. No suspicious LEFT axillary adenopathy.  10/08/2020 left breast US guided biopsy showed invasive mammary carcinoma, no special type. Grade 3, no DCIS, no LVI.  ER 51-90%, PR 51-90%, HER2 negative.   Family history of breast cancer: maternal cousin breast cancer. Great aunt breast cancer.  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  lives in a group home, and she is accompanied by  facility staff today. She has a guardian, Geraldo Pitter who is her maternal cousin.   Review of Systems  Constitutional:  Negative for appetite change, chills, fatigue and fever.  HENT:   Negative for hearing loss and voice change.   Eyes:  Negative for eye problems.  Respiratory:  Negative for chest tightness and cough.   Cardiovascular:  Negative for chest pain.  Gastrointestinal:  Negative for abdominal distention, abdominal pain  and blood in stool.  Endocrine: Negative for hot flashes.  Genitourinary:  Negative for difficulty urinating and frequency.   Musculoskeletal:  Negative for arthralgias.  Skin:  Negative for itching and rash.  Neurological:  Negative for extremity weakness.  Hematological:  Negative for adenopathy.  Psychiatric/Behavioral:  Negative for confusion.    MEDICAL HISTORY:  Past Medical History:  Diagnosis Date   Breast cancer ([place])    Diabetes ([place])    High cholesterol    Seizure ([place])     SURGICAL HISTORY: Past Surgical History:  Procedure Laterality Date   BREAST BIOPSY Right    neg   CHOLECYSTECTOMY     COLONOSCOPY WITH PROPOFOL N/A 08/27/2015   Procedure: COLONOSCOPY WITH PROPOFOL;  Surgeon: Lucilla Lame, MD;  Location: [facility] ENDOSCOPY;  Service: Endoscopy;  Laterality: N/A;    SOCIAL HISTORY: Social History   Socioeconomic History   Marital status: Single    Spouse name: Not on file   Number of children: Not on file   Years of education: Not on file   Highest education level: Not on file  Occupational History   Not on file  Tobacco Use   Smoking status: Never   Smokeless tobacco: Never  Substance and Sexual Activity   Alcohol use: No   Drug use: No   Sexual activity: Not on file  Other Topics Concern   Not on file  Social History Narrative   Patient works for cutting board   Patient has her adult education certificate   Patient drinks about 2 cups of coffee daily.          Social Determinants of Health   Financial Resource Strain: Not on  file  Food Insecurity: Not on file  Transportation Needs: Not on file  Physical Activity: Not on file  Stress: Not on file  Social Connections: Not on file  Intimate Partner Violence: Not on file    FAMILY HISTORY: Family History  Problem Relation Age of Onset   Diabetes Father    Heart disease Father    Heart disease Brother    Breast cancer Cousin     ALLERGIES:  has No Known Allergies.  MEDICATIONS:   Current Outpatient Medications  Medication Sig Dispense Refill   amLODipine (NORVASC) 5 MG tablet Take 5 mg by mouth daily.     aspirin 81 MG EC tablet Take 81 mg by mouth daily. Swallow whole.     carbamazepine (TEGRETOL) 200 MG tablet TAKE ONE TABLET BY MOUTH THREE TIMES DAILY. (SEIZURE CONTROL) (Patient taking differently: Take 200 mg by mouth 3 (three) times daily.) 90 tablet 11   Cholecalciferol (VITAMIN D-3) 1000 UNITS CAPS Take 4,000 Units by mouth daily.      lisinopril-hydrochlorothiazide (ZESTORETIC) 20-12.5 MG tablet Take 1 tablet by mouth daily.      simvastatin (ZOCOR) 20 MG tablet Take 20 mg by mouth daily.      No current facility-administered medications for this visit.     PHYSICAL EXAMINATION: ECOG PERFORMANCE STATUS: 1 - Symptomatic but completely ambulatory Vitals:   10/28/20 1511  BP: (!) 145/67  Pulse: 82  Resp: 17  Temp: 98.5 F (36.9 C)  SpO2: 100%   Filed Weights   10/28/20 1511  Weight: 193 lb 9.6 oz (87.8 kg)    Physical Exam Constitutional:      General: She is not in acute distress. HENT:     Head: Normocephalic and atraumatic.  Eyes:     General: No scleral icterus. Cardiovascular:     Rate and Rhythm: Normal rate and regular rhythm.     Heart sounds: Normal heart sounds.  Pulmonary:     Effort: Pulmonary effort is normal. No respiratory distress.     Breath sounds: No wheezing.  Abdominal:     General: Bowel sounds are normal. There is no distension.     Palpations: Abdomen is soft.  Musculoskeletal:        General: No deformity. Normal range of motion.     Cervical back: Normal range of motion and neck supple.  Skin:    General: Skin is warm and dry.     Findings: No erythema or rash.  Neurological:     Mental Status: She is alert and oriented to person, place, and time. Mental status is at baseline.     Cranial Nerves: No cranial nerve deficit.     Coordination: Coordination normal.  Psychiatric:        Mood and Affect: Mood  normal.  Breast exam was performed in seated and lying down position. Left breast upper ourter qudrant palapable breast mass.  No palpable breast mass in right breast. No lymphadenopathy of axillary bilaterally.    LABORATORY DATA:  I have reviewed the data as listed Lab Results  Component Value Date   WBC 4.7 10/28/2020   HGB 13.5 10/28/2020   HCT 39.7 10/28/2020   MCV 91.9 10/28/2020   PLT 235 10/28/2020   Recent Labs    12/28/19 1000 10/28/20 1555  NA 140 136  K 4.2 4.0  CL 99 99  CO2 27 31  GLUCOSE 115* 164*  BUN 15 14  CREATININE 0.75 0.71  CALCIUM 9.4 8.9  GFRNONAA  83 >60  GFRAA 95  --   PROT 7.0 7.8  ALBUMIN 4.6 4.3  AST 20 23  ALT 17 23  ALKPHOS 84 75  BILITOT <0.2 <0.1*   Iron/TIBC/Ferritin/ %Sat No results found for: IRON, TIBC, FERRITIN, IRONPCTSAT    RADIOGRAPHIC STUDIES: I have personally reviewed the radiological images as listed and agreed with the findings in the report. MM CLIP PLACEMENT LEFT  Result Date: 10/08/2020 CLINICAL DATA:  Post ultrasound-guided core needle biopsy of left breast 12:30 o'clock breast mass. EXAM: 3D DIAGNOSTIC LEFT MAMMOGRAM POST ULTRASOUND BIOPSY COMPARISON:  Previous exam(s). FINDINGS: 3D Mammographic images were obtained following ultrasound guided biopsy of left breast 12:30 o'clock mass. The biopsy marking clip is in expected position at the site of biopsy. IMPRESSION: Appropriate positioning of the venous shaped biopsy marking clip at the site of biopsy in the mass of question. Final Assessment: Post Procedure Mammograms for Marker Placement Electronically Signed   By: Fidela Salisbury M.D.   On: 10/08/2020 13:50  [place] LT BREAST BX W LOC DEV 1ST LESION IMG BX SPEC US GUIDE  Addendum Date: 10/18/2020   ADDENDUM REPORT: 10/18/2020 11:29 ADDENDUM: PATHOLOGY revealed: A. LEFT BREAST, 12:30 5 CMFN; ULTRASOUND-GUIDED BIOPSY: - INVASIVE MAMMARY CARCINOMA, NO SPECIAL TYPE. Size of invasive carcinoma: 12 mm in this sample. Grade  3. Ductal carcinoma in situ: Not identified. Lymphovascular invasion: Not identified. Pathology results are CONCORDANT with imaging findings, per Dr. Fidela Salisbury. Pathology results and recommendations were discussed with patient, cousin Noreene Larsson), and Aid Roderic Ovens) via telephone on 10/18/2020. Patient reported doing well after the biopsy with no adverse symptoms, and only slight tenderness at the site. Post biopsy care instructions were reviewed, questions were answered and my direct phone number was provided. Patient was instructed to call [facility] for any additional questions or concerns related to biopsy site. RECOMMENDATION: Surgical consultation. Request for surgical consultation relayed to Al Pimple RN and Tanya Nones RN at [facility] by Electa Sniff RN on 10/18/2020. Pathology results reported by Electa Sniff RN on 10/18/2020. Electronically Signed   By: Fidela Salisbury M.D.   On: 10/18/2020 11:29   Result Date: 10/18/2020 CLINICAL DATA:  Left breast 12:30 o'clock mass. EXAM: ULTRASOUND GUIDED LEFT BREAST CORE NEEDLE BIOPSY COMPARISON:  Previous exam(s). PROCEDURE: I met with the patient and we discussed the procedure of ultrasound-guided biopsy, including benefits and alternatives. We discussed the high likelihood of a successful procedure. We discussed the risks of the procedure, including infection, bleeding, tissue injury, clip migration, and inadequate sampling. Informed written consent was given. The usual time-out protocol was performed immediately prior to the procedure. Lesion quadrant: Upper outer quadrant Using sterile technique and 1% Lidocaine as local anesthetic, under direct ultrasound visualization, a 14 gauge spring-loaded device was used to perform biopsy of left breast 12:30 o'clock mass using a lateral approach. At the conclusion of the procedure Venus tissue marker clip was deployed into the biopsy cavity. Follow up 2 view mammogram was  performed and dictated separately. IMPRESSION: Ultrasound guided biopsy of the left breast. No apparent complications. Electronically Signed: By: Fidela Salisbury M.D. On: 10/08/2020 13:43      ASSESSMENT & PLAN:  1. Goals of care, counseling/discussion   2. Malignant neoplasm of left female breast, unspecified estrogen receptor status, unspecified site of breast ([place])   Cancer Staging Breast cancer [facility]) Staging form: Breast, AJCC 8th Edition - Clinical stage from 10/28/2020: Stage IA (cT1, cN0, cM0, G3, ER+, PR+, HER2-) - Signed by  Earlie Server, MD on 10/28/2020  # Clinical Stage 1 left breast cancer, ER/PR+, HER2 - Image findings and biopsy results were discussed with patient.  Recommend surgery first. She has appt with Dr.Cintron.  Post surgery, plan to send Oncotype Dx for evaluation of benefit of chemotherapy.  I ask RN navigator to update me about the surgery date and pathology report.  If no chemotherapy, then she will need adjuvant RT and endocrinology therapy.   # Family history of breast cancer.  Refer to genetic counselor for genetic testing.   Check cbc cmp CA 15-3 and CA 19.9  Orders Placed This Encounter  Procedures   CBC with Differential/Platelet    Standing Status:   Future    Number of Occurrences:   1    Standing Expiration Date:   10/28/2021   Comprehensive metabolic panel    Standing Status:   Future    Number of Occurrences:   1    Standing Expiration Date:   10/28/2021   Cancer antigen 15-3    Standing Status:   Future    Number of Occurrences:   1    Standing Expiration Date:   10/28/2021   Cancer antigen 27.29    Standing Status:   Future    Number of Occurrences:   1    Standing Expiration Date:   10/28/2021   Ambulatory referral to Genetics    Referral Priority:   Routine    Referral Type:   Consultation    Referral Reason:   Specialty Services Required    Number of Visits Requested:   1    All questions were answered. The patient knows to  call the clinic with any problems questions or concerns.   Kirk Ruths, MD    Return of visit: TBD    Earlie Server, MD, PhD Hematology Oncology [place] at [facility]  10/28/2020

## 2020-10-29 ENCOUNTER — Ambulatory Visit: Payer: Self-pay | Admitting: General Surgery

## 2020-10-29 ENCOUNTER — Other Ambulatory Visit: Payer: Self-pay | Admitting: General Surgery

## 2020-10-29 DIAGNOSIS — C50412 Malignant neoplasm of upper-outer quadrant of left female breast: Secondary | ICD-10-CM

## 2020-10-29 LAB — CANCER ANTIGEN 15-3: CA 15-3: 31.4 U/mL — ABNORMAL HIGH (ref 0.0–25.0)

## 2020-10-29 LAB — CANCER ANTIGEN 27.29: CA 27.29: 29.6 U/mL (ref 0.0–38.6)

## 2020-10-29 NOTE — H&P (Signed)
PATIENT PROFILE: Tamara Mejia is a 70 y.o. female who presents to the Clinic for consultation at the request of Dr. Ouida Sills for evaluation of left breast cancer.  PCP:  Harrold Donath, MD  HISTORY OF PRESENT ILLNESS: Ms. Distel reports she had her screening mammogram and she was found with an abnormality of the left breast.  This led to diagnostic mammogram and ultrasound.  This showed a 1.7 cm mass.  I personally evaluated the images.  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  Patient denies any previous breast pain, skin changes, palpable masses or nipple discharge.  Family history of breast cancer: Cousin with breast cancer Family history of other cancers: Unknown Menarche: Does not remember Menopause: Unknown Used OCP: None Used estrogen and progesterone therapy: None History of Radiation to the chest: None Previous breast biopsy: Yes 3 years ago unknown results   PROBLEM LIST: Problem List  Date Reviewed: 08/03/2019          Noted   Lives in group home 08/03/2019   Cognitive dysfunction 07/28/2017   Overview    [place] care maintenance 07/18/2014   Overview    Mammogram ordered 4-13 and 7-14. Colonoscopy per Dr Allen Norris and presumably Dr Biagio Borg after that. Vaccines unclear at this point but asked for a Zostavax 6-14.  MEDICARE WELLNESS VISIT Providers Rendering Care Dr Allen Norris, Dr Ouida Sills, Joya San vision, Dr Vickki Muff, Neuro  Functional Assessment (1) Hearing: Demonstrates no difficulty in hearing during normal conversation, Hearing was checked via the whisper test and the patient did hear it at 7-10 feet.  (2) Risk of Falls closely followed at [facility] (3) Home Safety: At [facility] (4) Activities of Daily Living: Needs a good deal of help at [place] Denies loss of interest in normal activities, has no episodes of weeping or anxiety and reports no changes in appetite or  sleep. Cognitive Impairment Patient reports episodes of misplacing items, Based on acquaintances observations, patient is forgetful PREVENTION PLAN Item Name Frequency Month Due Year(s) Due Cardiovascular Closely followed with elevated chol Diabetes Closely followed with known disease Mammogram; Every year Bone Mass on vit d Colonoscopy Per Dr Allen Norris or Biagio Borg, last in 2017 Pneumovax 2015  prevnar prescription 2017  Covid; Has been vaccinated Flu Vaccine Annually each fall zostavax 6-14, shingrix 7-20 Smoking Cessation Not Applicable  Other Personalized Health Advice Diabetic diet and walking at the group home   End of Life Counseling Cannot do with mr Physician [place] Ouida Sills, M.D.        Severe obesity (BMI 35.0-35.9 with comorbidity) (CMS-HCC) 01/16/2014   Overview    HTN, lipids and diabetes coexist.       DMII (diabetes mellitus, type 2) (CMS-HCC) 07/01/2013   HTN (hypertension), benign 07/01/2013   Hyperlipidemia 07/01/2013   Seizure (CMS-HCC) 07/01/2013   Overview    Sees neuro      OAB (overactive bladder) 07/01/2013       GENERAL REVIEW OF SYSTEMS:   General ROS: negative for - chills, fatigue, fever, weight gain or weight loss Allergy and Immunology ROS: negative for - hives  Hematological and Lymphatic ROS: negative for - bleeding problems or bruising, negative for palpable nodes Endocrine ROS: negative for - heat or cold intolerance, hair changes Respiratory ROS: negative for - cough, shortness of breath or wheezing Cardiovascular ROS: no chest pain or palpitations GI ROS: negative for nausea, vomiting, abdominal pain, diarrhea, constipation  Musculoskeletal ROS: negative for - joint swelling or muscle pain Neurological ROS: negative for - confusion, syncope Dermatological ROS: negative for pruritus and rash Psychiatric: negative for anxiety, depression, difficulty sleeping and memory loss  MEDICATIONS: Current Outpatient Medications   Medication Sig Dispense Refill   amLODIPine (NORVASC) 5 MG tablet TAKE 1 TABLET BY MOUTH ONCE DAILY 7 tablet 10   aspirin 81 MG EC tablet TAKE ONE TABLET BY MOUTH EVERY DAY *DO NOT CRUSH* 7 tablet 10   BD ALCOHOL SWABS PadM USE AS DIRECTED 100 Swab 0   carBAMazepine (TEGRETOL) 200 mg tablet Take 200 mg by mouth 3 (three) times daily.     cholecalciferol (VITAMIN D3) 2,000 unit capsule TAKE 2 CAPSULES (4000 UNITS) BY MOUTH EVERY DAY 14 capsule 10   lisinopril-hydrochlorothiazide (PRINZIDE,ZESTORETIC) 20-12.5 mg tablet TAKE ONE TABLET BY MOUTH EVERY DAY 30 tablet 10   simvastatin (ZOCOR) 20 MG tablet TAKE ONE TABLET BY MOUTH EACH DAY. 28 tablet 10   loperamide (IMODIUM A-D) 2 mg tablet Take 1 tablet by mouth every 4 (four) hours as needed (Patient not taking: Reported on 10/29/2020)     No current facility-administered medications for this visit.    ALLERGIES: Patient has no known allergies.  PAST MEDICAL HISTORY: Past Medical History:  Diagnosis Date   Cognitive disorder    Mild to moderate, at [facility].   Diabetes mellitus (CMS-HCC)    Hyperlipidemia    Hypertension    Overactive bladder    Seizures (CMS-HCC)     PAST SURGICAL HISTORY: No past surgical history on file.   FAMILY HISTORY: No family history on file.   SOCIAL HISTORY: Social History   Socioeconomic History   Marital status: Single  Tobacco Use   Smoking status: Never Smoker   Smokeless tobacco: Never Used  Substance and Sexual Activity   Alcohol use: No    PHYSICAL EXAM: Vitals:   10/29/20 1440  BP: (!) 166/97  Pulse: 78   Body mass index is 33.66 kg/m. Weight: 86.2 kg (190 lb)   GENERAL: Alert, active, oriented x3  HEENT: Pupils equal reactive to light. Extraocular movements are intact. Sclera clear. Palpebral conjunctiva normal red color.Pharynx clear.  NECK: Supple with no palpable mass and no adenopathy.  LUNGS: Sound clear with no rales rhonchi or wheezes.  HEART: Regular rhythm  S1 and S2 without murmur.  BREAST: breasts appear normal, no suspicious masses, no skin or nipple changes or axillary nodes.  ABDOMEN: Soft and depressible, nontender with no palpable mass, no hepatomegaly.  EXTREMITIES: Well-developed well-nourished symmetrical with no dependent edema.  NEUROLOGICAL: Awake alert oriented, facial expression symmetrical, moving all extremities.  REVIEW OF DATA: I have reviewed the following data today: Appointment on 08/15/2020  Component Date Value   Glucose 08/15/2020 115 (!)   Sodium 08/15/2020 141    Potassium 08/15/2020 4.3    Chloride 08/15/2020 102    Carbon Dioxide (CO2) 08/15/2020 29.9    Urea Nitrogen (BUN) 08/15/2020 13    Creatinine 08/15/2020 0.7    Glomerular Filtration Ra* 08/15/2020 83    Calcium 08/15/2020 9.6    AST  08/15/2020 21    ALT  08/15/2020 22    Alk Phos (alkaline Phosp* 08/15/2020 79    Albumin 08/15/2020 4.5    Bilirubin, Total 08/15/2020 0.3    Protein, Total 08/15/2020 7.1    A/G Ratio 08/15/2020 1.7    Hemoglobin A1C 08/15/2020 6.3 (!)   Average Blood Glucose (C* 08/15/2020 134    Bilirubin,  Conjugated 08/15/2020 0.06    Cholesterol, Total 08/15/2020 177    Triglyceride 08/15/2020 226 (!)   HDL (High Density Lipopr* 08/15/2020 67.0    LDL Calculated 08/15/2020 65    VLDL Cholesterol 08/15/2020 45    Cholesterol/HDL Ratio 08/15/2020 2.6    Urine Albumin, Random 08/15/2020 <7      ASSESSMENT: Ms. Karle is a 70 y.o. female presenting for consultation for left breast cancer.    Patient was oriented again about the pathology results. Surgical alternatives were discussed with patient including partial vs total mastectomy. Surgical technique and post operative care was discussed with patient. Risk of surgery was discussed with patient including but not limited to: wound infection, seroma, hematoma, brachial plexopathy, mondor's disease (thrombosis of small veins of breast), chronic wound pain, breast lymphedema,  altered sensation to the nipple and cosmesis among others.   Patient was evaluated by medical oncology.  They recommended upfront partial mastectomy with possible adjuvant radiation and antiestrogen therapy.  Malignant neoplasm of upper-outer quadrant of left breast in female, estrogen receptor positive (CMS-HCC) [C50.412, Z17.0]  PLAN: 1.  Left breast radiofrequency tag partial mastectomy and sentinel node biopsy (19301, 38525) 2.  Hold aspirin 5 days before the surgery 3.  Contact us if you have any concern  Patient and caregiver verbalized understanding, all questions were answered, and were agreeable with the plan outlined above.     Herbert Pun, MD

## 2020-10-29 NOTE — H&P (View-Only) (Signed)
PATIENT PROFILE: Tamara Mejia is a 70 y.o. female who presents to the Clinic for consultation at the request of Dr. Ouida Sills for evaluation of left breast cancer.  PCP:  Harrold Donath, MD  HISTORY OF PRESENT ILLNESS: Ms. Trolinger reports she had her screening mammogram and she was found with an abnormality of the left breast.  This led to diagnostic mammogram and ultrasound.  This showed a 1.7 cm mass.  I personally evaluated the images.  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  Patient denies any previous breast pain, skin changes, palpable masses or nipple discharge.  Family history of breast cancer: Cousin with breast cancer Family history of other cancers: Unknown Menarche: Does not remember Menopause: Unknown Used OCP: None Used estrogen and progesterone therapy: None History of Radiation to the chest: None Previous breast biopsy: Yes 3 years ago unknown results   PROBLEM LIST: Problem List  Date Reviewed: 08/03/2019          Noted   Lives in group home 08/03/2019   Cognitive dysfunction 07/28/2017   Overview    [place] care maintenance 07/18/2014   Overview    Mammogram ordered 4-13 and 7-14. Colonoscopy per Dr Allen Norris and presumably Dr Biagio Borg after that. Vaccines unclear at this point but asked for a Zostavax 6-14.  MEDICARE WELLNESS VISIT Providers Rendering Care Dr Allen Norris, Dr Ouida Sills, Joya San vision, Dr Vickki Muff, Neuro  Functional Assessment (1) Hearing: Demonstrates no difficulty in hearing during normal conversation, Hearing was checked via the whisper test and the patient did hear it at 7-10 feet.  (2) Risk of Falls closely followed at [facility] (3) Home Safety: At [facility] (4) Activities of Daily Living: Needs a good deal of help at [place] Denies loss of interest in normal activities, has no episodes of weeping or anxiety and reports no changes in appetite or  sleep. Cognitive Impairment Patient reports episodes of misplacing items, Based on acquaintances observations, patient is forgetful PREVENTION PLAN Item Name Frequency Month Due Year(s) Due Cardiovascular Closely followed with elevated chol Diabetes Closely followed with known disease Mammogram; Every year Bone Mass on vit d Colonoscopy Per Dr Allen Norris or Biagio Borg, last in 2017 Pneumovax 2015  prevnar prescription 2017  Covid; Has been vaccinated Flu Vaccine Annually each fall zostavax 6-14, shingrix 7-20 Smoking Cessation Not Applicable  Other Personalized Health Advice Diabetic diet and walking at the group home   End of Life Counseling Cannot do with mr Physician [place] Ouida Sills, M.D.        Severe obesity (BMI 35.0-35.9 with comorbidity) (CMS-HCC) 01/16/2014   Overview    HTN, lipids and diabetes coexist.       DMII (diabetes mellitus, type 2) (CMS-HCC) 07/01/2013   HTN (hypertension), benign 07/01/2013   Hyperlipidemia 07/01/2013   Seizure (CMS-HCC) 07/01/2013   Overview    Sees neuro      OAB (overactive bladder) 07/01/2013       GENERAL REVIEW OF SYSTEMS:   General ROS: negative for - chills, fatigue, fever, weight gain or weight loss Allergy and Immunology ROS: negative for - hives  Hematological and Lymphatic ROS: negative for - bleeding problems or bruising, negative for palpable nodes Endocrine ROS: negative for - heat or cold intolerance, hair changes Respiratory ROS: negative for - cough, shortness of breath or wheezing Cardiovascular ROS: no chest pain or palpitations GI ROS: negative for nausea, vomiting, abdominal pain, diarrhea, constipation  Musculoskeletal ROS: negative for - joint swelling or muscle pain Neurological ROS: negative for - confusion, syncope Dermatological ROS: negative for pruritus and rash Psychiatric: negative for anxiety, depression, difficulty sleeping and memory loss  MEDICATIONS: Current Outpatient Medications   Medication Sig Dispense Refill   amLODIPine (NORVASC) 5 MG tablet TAKE 1 TABLET BY MOUTH ONCE DAILY 7 tablet 10   aspirin 81 MG EC tablet TAKE ONE TABLET BY MOUTH EVERY DAY *DO NOT CRUSH* 7 tablet 10   BD ALCOHOL SWABS PadM USE AS DIRECTED 100 Swab 0   carBAMazepine (TEGRETOL) 200 mg tablet Take 200 mg by mouth 3 (three) times daily.     cholecalciferol (VITAMIN D3) 2,000 unit capsule TAKE 2 CAPSULES (4000 UNITS) BY MOUTH EVERY DAY 14 capsule 10   lisinopril-hydrochlorothiazide (PRINZIDE,ZESTORETIC) 20-12.5 mg tablet TAKE ONE TABLET BY MOUTH EVERY DAY 30 tablet 10   simvastatin (ZOCOR) 20 MG tablet TAKE ONE TABLET BY MOUTH EACH DAY. 28 tablet 10   loperamide (IMODIUM A-D) 2 mg tablet Take 1 tablet by mouth every 4 (four) hours as needed (Patient not taking: Reported on 10/29/2020)     No current facility-administered medications for this visit.    ALLERGIES: Patient has no known allergies.  PAST MEDICAL HISTORY: Past Medical History:  Diagnosis Date   Cognitive disorder    Mild to moderate, at [facility].   Diabetes mellitus (CMS-HCC)    Hyperlipidemia    Hypertension    Overactive bladder    Seizures (CMS-HCC)     PAST SURGICAL HISTORY: No past surgical history on file.   FAMILY HISTORY: No family history on file.   SOCIAL HISTORY: Social History   Socioeconomic History   Marital status: Single  Tobacco Use   Smoking status: Never Smoker   Smokeless tobacco: Never Used  Substance and Sexual Activity   Alcohol use: No    PHYSICAL EXAM: Vitals:   10/29/20 1440  BP: (!) 166/97  Pulse: 78   Body mass index is 33.66 kg/m. Weight: 86.2 kg (190 lb)   GENERAL: Alert, active, oriented x3  HEENT: Pupils equal reactive to light. Extraocular movements are intact. Sclera clear. Palpebral conjunctiva normal red color.Pharynx clear.  NECK: Supple with no palpable mass and no adenopathy.  LUNGS: Sound clear with no rales rhonchi or wheezes.  HEART: Regular rhythm  S1 and S2 without murmur.  BREAST: breasts appear normal, no suspicious masses, no skin or nipple changes or axillary nodes.  ABDOMEN: Soft and depressible, nontender with no palpable mass, no hepatomegaly.  EXTREMITIES: Well-developed well-nourished symmetrical with no dependent edema.  NEUROLOGICAL: Awake alert oriented, facial expression symmetrical, moving all extremities.  REVIEW OF DATA: I have reviewed the following data today: Appointment on 08/15/2020  Component Date Value   Glucose 08/15/2020 115 (!)   Sodium 08/15/2020 141    Potassium 08/15/2020 4.3    Chloride 08/15/2020 102    Carbon Dioxide (CO2) 08/15/2020 29.9    Urea Nitrogen (BUN) 08/15/2020 13    Creatinine 08/15/2020 0.7    Glomerular Filtration Ra* 08/15/2020 83    Calcium 08/15/2020 9.6    AST  08/15/2020 21    ALT  08/15/2020 22    Alk Phos (alkaline Phosp* 08/15/2020 79    Albumin 08/15/2020 4.5    Bilirubin, Total 08/15/2020 0.3    Protein, Total 08/15/2020 7.1    A/G Ratio 08/15/2020 1.7    Hemoglobin A1C 08/15/2020 6.3 (!)   Average Blood Glucose (C* 08/15/2020 134    Bilirubin,  Conjugated 08/15/2020 0.06    Cholesterol, Total 08/15/2020 177    Triglyceride 08/15/2020 226 (!)   HDL (High Density Lipopr* 08/15/2020 67.0    LDL Calculated 08/15/2020 65    VLDL Cholesterol 08/15/2020 45    Cholesterol/HDL Ratio 08/15/2020 2.6    Urine Albumin, Random 08/15/2020 <7      ASSESSMENT: Ms. Karle is a 70 y.o. female presenting for consultation for left breast cancer.    Patient was oriented again about the pathology results. Surgical alternatives were discussed with patient including partial vs total mastectomy. Surgical technique and post operative care was discussed with patient. Risk of surgery was discussed with patient including but not limited to: wound infection, seroma, hematoma, brachial plexopathy, mondor's disease (thrombosis of small veins of breast), chronic wound pain, breast lymphedema,  altered sensation to the nipple and cosmesis among others.   Patient was evaluated by medical oncology.  They recommended upfront partial mastectomy with possible adjuvant radiation and antiestrogen therapy.  Malignant neoplasm of upper-outer quadrant of left breast in female, estrogen receptor positive (CMS-HCC) [C50.412, Z17.0]  PLAN: 1.  Left breast radiofrequency tag partial mastectomy and sentinel node biopsy (19301, 38525) 2.  Hold aspirin 5 days before the surgery 3.  Contact us if you have any concern  Patient and caregiver verbalized understanding, all questions were answered, and were agreeable with the plan outlined above.     Herbert Pun, MD

## 2020-10-30 ENCOUNTER — Other Ambulatory Visit: Payer: Self-pay | Admitting: General Surgery

## 2020-10-30 DIAGNOSIS — Z170 Estrogen receptor positive status [ER+]: Secondary | ICD-10-CM

## 2020-10-30 DIAGNOSIS — C50412 Malignant neoplasm of upper-outer quadrant of left female breast: Secondary | ICD-10-CM

## 2020-11-01 ENCOUNTER — Other Ambulatory Visit: Payer: Self-pay

## 2020-11-01 ENCOUNTER — Encounter
Admission: RE | Admit: 2020-11-01 | Discharge: 2020-11-01 | Disposition: A | Discharge status: Home / Self Care | Payer: Medicare Other | Source: Ambulatory Visit | Attending: General Surgery | Admitting: General Surgery

## 2020-11-01 HISTORY — DX: Type 2 diabetes mellitus without complications: E11.9

## 2020-11-01 HISTORY — DX: Essential (primary) hypertension: I10

## 2020-11-01 HISTORY — DX: Overactive bladder: N32.81

## 2020-11-01 HISTORY — DX: Meningitis, unspecified: G03.9

## 2020-11-01 HISTORY — DX: Unspecified mental disorder due to known physiological condition: F09

## 2020-11-01 HISTORY — DX: Obesity, unspecified: E66.9

## 2020-11-01 NOTE — Pre-Procedure Instructions (Signed)
Pre-op interview with group home employee indicated that Tamara Mejia has been exposed to others confirmed to have Covid-19. Tamara Mejia is currently not experiencing any covid-like symptoms and has tested negative for Covid-19. Instructed to re-test Tamara Mejia on Monday prior to coming in for EKG and radiofrequency tag placement. If she tests positive for Covid, do not go to appointments and call Dr. Windell Moment office and pre-admit testing office to inform of the positive result. Also instructed to test Tamara Mejia again on the day of surgery (October 19) prior to coming to the hospital and only come in if test is negative. If Tamara Mejia develops symptoms between now and the day of surgery to notify Dr. Windell Moment office. Legal guardianship papers on the chart.

## 2020-11-01 NOTE — Pre-Procedure Instructions (Signed)
Surgical consent emailed to legal guardian Tyrone Apple for signature. Signed surgical consent returned and on the chart.

## 2020-11-01 NOTE — Patient Instructions (Addendum)
Your procedure is scheduled on: Wednesday, October 19 Report to the Registration Desk on the 1st floor of the [facility] at 8:30 am and then go to the Radiology Desk.  REMEMBER: Instructions that are not followed completely may result in serious medical risk, up to and including death; or upon the discretion of your surgeon and anesthesiologist your surgery may need to be rescheduled.  Do not eat food after midnight the night before surgery.  No gum chewing, lozengers or hard candies.  You may however, drink water up to 2 hours before you are scheduled to arrive for your surgery. Do not drink anything within 2 hours of your scheduled arrival time.  TAKE THESE MEDICATIONS THE MORNING OF SURGERY WITH A SIP OF WATER:  Amlodipine (Norvasc) Carbamazepine (Tegretol) Simvastatin (Zocor)  One week prior to surgery: Stop Anti-inflammatories (NSAIDS) such as Advil, Aleve, Ibuprofen, Motrin, Naproxen, Naprosyn and Aspirin based products such as Excedrin, Goodys Powder, BC Powder. Stop ANY OVER THE COUNTER supplements until after surgery. You may however, continue to take Tylenol if needed for pain up until the day of surgery.  No Alcohol for 24 hours before or after surgery.  No Smoking including e-cigarettes for 24 hours prior to surgery.  No chewable tobacco products for at least 6 hours prior to surgery.  No nicotine patches on the day of surgery.  Do not use any "recreational" drugs for at least a week prior to your surgery.  Please be advised that the combination of cocaine and anesthesia may have negative outcomes, up to and including death. If you test positive for cocaine, your surgery will be cancelled.  On the morning of surgery brush your teeth with toothpaste and water, you may rinse your mouth with mouthwash if you wish. Do not swallow any toothpaste or mouthwash.  Use CHG Soap as directed on instruction sheet.  Do not wear jewelry, make-up, hairpins, clips or nail  polish.  Do not wear lotions, powders, or perfumes.   Do not shave body from the neck down 48 hours prior to surgery just in case you cut yourself which could leave a site for infection.  Also, freshly shaved skin may become irritated if using the CHG soap.  Contact lenses, hearing aids and dentures may not be worn into surgery.  Do not bring valuables to the hospital. [facility] is not responsible for any missing/lost belongings or valuables.   Notify your doctor if there is any change in your medical condition (cold, fever, infection).  Wear comfortable clothing (specific to your surgery type) to the hospital.  If you are being discharged the day of surgery, you will not be allowed to drive home. You will need a responsible adult (18 years or older) to drive you home and stay with you that night.   If you are taking public transportation, you will need to have a responsible adult (18 years or older) with you. Please confirm with your physician that it is acceptable to use public transportation.   Please call the [facility] at [phone] if you have any questions about these instructions.  Surgery Visitation Policy:  Patients undergoing a surgery or procedure may have one family member or support person with them as long as that person is not COVID-19 positive or experiencing its symptoms.  That person may remain in the waiting area during the procedure and may rotate out with other people.

## 2020-11-04 ENCOUNTER — Encounter (HOSPITAL_COMMUNITY): Payer: Self-pay | Admitting: Urgent Care

## 2020-11-04 ENCOUNTER — Ambulatory Visit: Admission: RE | Admit: 2020-11-04 | Payer: Medicare Other | Source: Ambulatory Visit

## 2020-11-04 ENCOUNTER — Encounter: Payer: Self-pay | Admitting: General Surgery

## 2020-11-04 ENCOUNTER — Encounter: Admission: RE | Admit: 2020-11-04 | Payer: Medicare Other | Source: Ambulatory Visit

## 2020-11-04 DIAGNOSIS — Z8616 Personal history of COVID-19: Secondary | ICD-10-CM

## 2020-11-04 HISTORY — DX: Personal history of COVID-19: Z86.16

## 2020-11-06 ENCOUNTER — Encounter: Admission: RE | Payer: Self-pay | Source: Home / Self Care

## 2020-11-06 ENCOUNTER — Ambulatory Visit: Payer: Medicare Other

## 2020-11-06 ENCOUNTER — Other Ambulatory Visit: Payer: Medicare Other

## 2020-11-06 ENCOUNTER — Ambulatory Visit: Admission: RE | Admit: 2020-11-06 | Payer: Medicare Other | Source: Home / Self Care | Admitting: General Surgery

## 2020-11-06 SURGERY — PART MASTECTOMY,RADIO FREQUENCY LOCALIZER,AXILLARY SENTINEL NODE BIOPSY
Anesthesia: General | Laterality: Left

## 2020-11-13 ENCOUNTER — Inpatient Hospital Stay: Payer: Medicare Other

## 2020-11-13 ENCOUNTER — Inpatient Hospital Stay: Payer: Medicare Other | Admitting: Licensed Clinical Social Worker

## 2020-11-20 ENCOUNTER — Ambulatory Visit
Admission: RE | Admit: 2020-11-20 | Discharge: 2020-11-20 | Disposition: A | Discharge status: Home / Self Care | Payer: Medicare Other | Source: Ambulatory Visit | Attending: General Surgery | Admitting: General Surgery

## 2020-11-20 ENCOUNTER — Other Ambulatory Visit: Payer: Self-pay

## 2020-11-20 ENCOUNTER — Encounter
Admission: RE | Admit: 2020-11-20 | Discharge: 2020-11-20 | Disposition: A | Discharge status: Home / Self Care | Payer: Medicare Other | Source: Ambulatory Visit | Attending: General Surgery | Admitting: General Surgery

## 2020-11-20 ENCOUNTER — Other Ambulatory Visit: Payer: Self-pay | Admitting: General Surgery

## 2020-11-20 ENCOUNTER — Ambulatory Visit: Admission: RE | Admit: 2020-11-20 | Payer: Medicare Other | Source: Ambulatory Visit

## 2020-11-20 DIAGNOSIS — C50412 Malignant neoplasm of upper-outer quadrant of left female breast: Secondary | ICD-10-CM

## 2020-11-20 DIAGNOSIS — Z170 Estrogen receptor positive status [ER+]: Secondary | ICD-10-CM

## 2020-11-22 ENCOUNTER — Ambulatory Visit
Admission: RE | Admit: 2020-11-22 | Discharge: 2020-11-22 | Disposition: A | Discharge status: Home / Self Care | Payer: Medicare Other | Source: Ambulatory Visit | Attending: General Surgery | Admitting: General Surgery

## 2020-11-22 ENCOUNTER — Ambulatory Visit: Payer: Medicare Other | Admitting: Anesthesiology

## 2020-11-22 ENCOUNTER — Other Ambulatory Visit: Payer: Self-pay

## 2020-11-22 ENCOUNTER — Other Ambulatory Visit: Payer: Medicare Other

## 2020-11-22 ENCOUNTER — Ambulatory Visit
Admission: RE | Admit: 2020-11-22 | Discharge: 2020-11-22 | Disposition: A | Discharge status: Home / Self Care | Payer: Medicare Other | Attending: General Surgery | Admitting: General Surgery

## 2020-11-22 ENCOUNTER — Encounter
Admission: RE | Disposition: A | Discharge status: Home / Self Care | Payer: Self-pay | Source: Home / Self Care | Attending: General Surgery

## 2020-11-22 DIAGNOSIS — F79 Unspecified intellectual disabilities: Secondary | ICD-10-CM | POA: Insufficient documentation

## 2020-11-22 DIAGNOSIS — Z170 Estrogen receptor positive status [ER+]: Secondary | ICD-10-CM | POA: Diagnosis not present

## 2020-11-22 DIAGNOSIS — E119 Type 2 diabetes mellitus without complications: Secondary | ICD-10-CM | POA: Insufficient documentation

## 2020-11-22 DIAGNOSIS — E7800 Pure hypercholesterolemia, unspecified: Secondary | ICD-10-CM | POA: Insufficient documentation

## 2020-11-22 DIAGNOSIS — Z8616 Personal history of COVID-19: Secondary | ICD-10-CM | POA: Diagnosis not present

## 2020-11-22 DIAGNOSIS — R569 Unspecified convulsions: Secondary | ICD-10-CM | POA: Insufficient documentation

## 2020-11-22 DIAGNOSIS — C50412 Malignant neoplasm of upper-outer quadrant of left female breast: Secondary | ICD-10-CM

## 2020-11-22 DIAGNOSIS — Z6833 Body mass index (BMI) 33.0-33.9, adult: Secondary | ICD-10-CM | POA: Insufficient documentation

## 2020-11-22 DIAGNOSIS — N3281 Overactive bladder: Secondary | ICD-10-CM | POA: Insufficient documentation

## 2020-11-22 DIAGNOSIS — C773 Secondary and unspecified malignant neoplasm of axilla and upper limb lymph nodes: Secondary | ICD-10-CM | POA: Diagnosis not present

## 2020-11-22 DIAGNOSIS — I10 Essential (primary) hypertension: Secondary | ICD-10-CM | POA: Diagnosis not present

## 2020-11-22 HISTORY — PX: PART MASTECTOMY,RADIO FREQUENCY LOCALIZER,AXILLARY SENTINEL NODE BIOPSY: SHX6901

## 2020-11-22 LAB — GLUCOSE, CAPILLARY
Glucose-Capillary: 132 mg/dL — ABNORMAL HIGH (ref 70–99)
Glucose-Capillary: 112 mg/dL — ABNORMAL HIGH (ref 70–99)

## 2020-11-22 SURGERY — PART MASTECTOMY,RADIO FREQUENCY LOCALIZER,AXILLARY SENTINEL NODE BIOPSY
Anesthesia: General | Site: Breast | Laterality: Left

## 2020-11-22 MED ORDER — CHLORHEXIDINE GLUCONATE 0.12 % MT SOLN: 15.0000 mL | Freq: Once | OROMUCOSAL | Status: AC

## 2020-11-22 MED ORDER — BUPIVACAINE-EPINEPHRINE (PF) 0.5% -1:200000 IJ SOLN
INTRAMUSCULAR | Status: DC | PRN
  Administered 2020-11-22: 30 mL via PERINEURAL

## 2020-11-22 MED ORDER — KETOROLAC TROMETHAMINE 30 MG/ML IJ SOLN
INTRAMUSCULAR | Status: DC | PRN
  Administered 2020-11-22: 15 mg via INTRAVENOUS

## 2020-11-22 MED ORDER — CHLORHEXIDINE GLUCONATE 0.12 % MT SOLN
OROMUCOSAL | Status: AC
  Administered 2020-11-22: 15 mL via OROMUCOSAL
  Filled 2020-11-22: qty 15

## 2020-11-22 MED ORDER — MEPERIDINE HCL 25 MG/ML IJ SOLN: 6.2500 mg | INTRAMUSCULAR | Status: DC | PRN

## 2020-11-22 MED ORDER — ORAL CARE MOUTH RINSE: 15.0000 mL | Freq: Once | OROMUCOSAL | Status: AC

## 2020-11-22 MED ORDER — OXYCODONE HCL 5 MG PO TABS
ORAL_TABLET | ORAL | Status: AC
  Filled 2020-11-22: qty 1

## 2020-11-22 MED ORDER — OXYCODONE HCL 5 MG/5ML PO SOLN: 5.0000 mg | Freq: Once | ORAL | Status: AC | PRN

## 2020-11-22 MED ORDER — CEFAZOLIN SODIUM-DEXTROSE 2-4 GM/100ML-% IV SOLN
INTRAVENOUS | Status: AC
  Filled 2020-11-22: qty 100

## 2020-11-22 MED ORDER — BUPIVACAINE-EPINEPHRINE (PF) 0.5% -1:200000 IJ SOLN
INTRAMUSCULAR | Status: AC
  Filled 2020-11-22: qty 30

## 2020-11-22 MED ORDER — FENTANYL CITRATE (PF) 100 MCG/2ML IJ SOLN: 25.0000 ug | INTRAMUSCULAR | Status: DC | PRN

## 2020-11-22 MED ORDER — FAMOTIDINE 20 MG PO TABS
ORAL_TABLET | ORAL | Status: AC
  Administered 2020-11-22: 20 mg via ORAL
  Filled 2020-11-22: qty 1

## 2020-11-22 MED ORDER — OXYCODONE HCL 5 MG PO TABS
5.0000 mg | ORAL_TABLET | Freq: Once | ORAL | Status: AC | PRN
  Administered 2020-11-22: 5 mg via ORAL

## 2020-11-22 MED ORDER — ONDANSETRON HCL 4 MG/2ML IJ SOLN
INTRAMUSCULAR | Status: DC | PRN
  Administered 2020-11-22: 4 mg via INTRAVENOUS

## 2020-11-22 MED ORDER — DIPHENHYDRAMINE HCL 50 MG/ML IJ SOLN
INTRAMUSCULAR | Status: DC | PRN
  Administered 2020-11-22: 12.5 mg via INTRAVENOUS

## 2020-11-22 MED ORDER — PHENYLEPHRINE HCL (PRESSORS) 10 MG/ML IV SOLN
INTRAVENOUS | Status: DC | PRN
  Administered 2020-11-22 (×3): 80 ug via INTRAVENOUS

## 2020-11-22 MED ORDER — PROPOFOL 10 MG/ML IV BOLUS
INTRAVENOUS | Status: AC
  Filled 2020-11-22: qty 20

## 2020-11-22 MED ORDER — SODIUM CHLORIDE 0.9 % IV SOLN: INTRAVENOUS | Status: DC

## 2020-11-22 MED ORDER — HYDROCODONE-ACETAMINOPHEN 5-325 MG PO TABS
1.0000 | ORAL_TABLET | ORAL | 0 refills | Status: AC | PRN
Start: 2020-11-22 — End: 2020-11-25

## 2020-11-22 MED ORDER — DEXMEDETOMIDINE HCL IN NACL 200 MCG/50ML IV SOLN
INTRAVENOUS | Status: DC | PRN
  Administered 2020-11-22 (×3): 4 ug via INTRAVENOUS

## 2020-11-22 MED ORDER — FENTANYL CITRATE (PF) 100 MCG/2ML IJ SOLN
INTRAMUSCULAR | Status: DC | PRN
  Administered 2020-11-22 (×2): 25 ug via INTRAVENOUS

## 2020-11-22 MED ORDER — LACTATED RINGERS IV SOLN: INTRAVENOUS | Status: DC | PRN

## 2020-11-22 MED ORDER — LIDOCAINE HCL (CARDIAC) PF 100 MG/5ML IV SOSY
PREFILLED_SYRINGE | INTRAVENOUS | Status: DC | PRN
  Administered 2020-11-22: 50 mg via INTRAVENOUS

## 2020-11-22 MED ORDER — FAMOTIDINE 20 MG PO TABS: 20.0000 mg | ORAL_TABLET | Freq: Once | ORAL | Status: AC

## 2020-11-22 MED ORDER — FENTANYL CITRATE (PF) 100 MCG/2ML IJ SOLN
INTRAMUSCULAR | Status: AC
  Filled 2020-11-22: qty 2

## 2020-11-22 MED ORDER — ACETAMINOPHEN 10 MG/ML IV SOLN
INTRAVENOUS | Status: DC | PRN
  Administered 2020-11-22: 1000 mg via INTRAVENOUS

## 2020-11-22 MED ORDER — MIDAZOLAM HCL 2 MG/2ML IJ SOLN
INTRAMUSCULAR | Status: DC | PRN
  Administered 2020-11-22: 2 mg via INTRAVENOUS

## 2020-11-22 MED ORDER — METHYLENE BLUE 0.5 % INJ SOLN
INTRAVENOUS | Status: AC
  Filled 2020-11-22: qty 10

## 2020-11-22 MED ORDER — PROPOFOL 10 MG/ML IV BOLUS
INTRAVENOUS | Status: DC | PRN
  Administered 2020-11-22: 140 mg via INTRAVENOUS

## 2020-11-22 MED ORDER — PROMETHAZINE HCL 25 MG/ML IJ SOLN: 6.2500 mg | INTRAMUSCULAR | Status: DC | PRN

## 2020-11-22 MED ORDER — TECHNETIUM TC 99M TILMANOCEPT KIT
1.0000 | PACK | Freq: Once | INTRAVENOUS | Status: AC | PRN
  Administered 2020-11-22: 1.067 via INTRADERMAL

## 2020-11-22 MED ORDER — CEFAZOLIN SODIUM-DEXTROSE 2-4 GM/100ML-% IV SOLN
2.0000 g | INTRAVENOUS | Status: AC
  Administered 2020-11-22: 2 g via INTRAVENOUS

## 2020-11-22 MED ORDER — MIDAZOLAM HCL 2 MG/2ML IJ SOLN
INTRAMUSCULAR | Status: AC
  Filled 2020-11-22: qty 2

## 2020-11-22 SURGICAL SUPPLY — 48 items
ADH SKN CLS APL DERMABOND .7 (GAUZE/BANDAGES/DRESSINGS) ×1
APL PRP STRL LF DISP 70% ISPRP (MISCELLANEOUS) ×2
BLADE SURG 15 STRL LF DISP TIS (BLADE) ×2 IMPLANT
BLADE SURG 15 STRL SS (BLADE) ×4
CHLORAPREP W/TINT 26 (MISCELLANEOUS) ×4 IMPLANT
CNTNR SPEC 2.5X3XGRAD LEK (MISCELLANEOUS)
CONT SPEC 4OZ STER OR WHT (MISCELLANEOUS)
CONT SPEC 4OZ STRL OR WHT (MISCELLANEOUS)
CONTAINER SPEC 2.5X3XGRAD LEK (MISCELLANEOUS) IMPLANT
DERMABOND ADVANCED (GAUZE/BANDAGES/DRESSINGS) ×1
DERMABOND ADVANCED .7 DNX12 (GAUZE/BANDAGES/DRESSINGS) ×1 IMPLANT
DEVICE DUBIN SPECIMEN MAMMOGRA (MISCELLANEOUS) ×2 IMPLANT
DRAPE LAPAROTOMY TRNSV 106X77 (MISCELLANEOUS) ×2 IMPLANT
DRSG GAUZE FLUFF 36X18 (GAUZE/BANDAGES/DRESSINGS) IMPLANT
ELECT CAUTERY BLADE 6.4 (BLADE) ×2 IMPLANT
ELECT REM PT RETURN 9FT ADLT (ELECTROSURGICAL) ×2
ELECTRODE REM PT RTRN 9FT ADLT (ELECTROSURGICAL) ×1 IMPLANT
GAUZE 4X4 16PLY ~~LOC~~+RFID DBL (SPONGE) ×2 IMPLANT
GLOVE SURG ENC MOIS LTX SZ6.5 (GLOVE) ×2 IMPLANT
GLOVE SURG UNDER POLY LF SZ6.5 (GLOVE) ×2 IMPLANT
GOWN STRL REUS W/ TWL LRG LVL3 (GOWN DISPOSABLE) ×2 IMPLANT
GOWN STRL REUS W/TWL LRG LVL3 (GOWN DISPOSABLE) ×4
KIT MARKER MARGIN INK (KITS) ×2 IMPLANT
KIT TURNOVER KIT A (KITS) ×2 IMPLANT
LABEL OR SOLS (LABEL) ×2 IMPLANT
MANIFOLD NEPTUNE II (INSTRUMENTS) ×2 IMPLANT
MARGIN MAP 10MM (MISCELLANEOUS) ×2 IMPLANT
MARKER MARGIN CORRECT CLIP (MARKER) ×2 IMPLANT
NEEDLE HYPO 22GX1.5 SAFETY (NEEDLE) ×2 IMPLANT
NEEDLE HYPO 25X1 1.5 SAFETY (NEEDLE) ×2 IMPLANT
PACK BASIN MINOR ARMC (MISCELLANEOUS) ×2 IMPLANT
RETRACTOR RING XSMALL (MISCELLANEOUS) ×1 IMPLANT
RTRCTR WOUND ALEXIS 13CM XS SH (MISCELLANEOUS) ×2
SET LOCALIZER 20 PROBE US (MISCELLANEOUS) ×2 IMPLANT
SLEVE PROBE SENORX GAMMA FIND (MISCELLANEOUS) ×2 IMPLANT
SUT ETHILON 3-0 FS-10 30 BLK (SUTURE) ×2
SUT MNCRL 4-0 (SUTURE) ×4
SUT MNCRL 4-0 27XMFL (SUTURE) ×2
SUT SILK 2 0 SH (SUTURE) ×2 IMPLANT
SUT VIC AB 3-0 SH 27 (SUTURE) ×4
SUT VIC AB 3-0 SH 27X BRD (SUTURE) ×2 IMPLANT
SUTURE EHLN 3-0 FS-10 30 BLK (SUTURE) ×1 IMPLANT
SUTURE MNCRL 4-0 27XMF (SUTURE) ×2 IMPLANT
SYR 10ML LL (SYRINGE) ×4 IMPLANT
SYR BULB IRRIG 60ML STRL (SYRINGE) ×2 IMPLANT
TRAP NEPTUNE SPECIMEN COLLECT (MISCELLANEOUS) ×2 IMPLANT
WATER STERILE IRR 1000ML POUR (IV SOLUTION) ×2 IMPLANT
WATER STERILE IRR 500ML POUR (IV SOLUTION) ×2 IMPLANT

## 2020-11-22 NOTE — Interval H&P Note (Signed)
History and Physical Interval Note:  11/22/2020 1:07 PM  Tamara Mejia  has presented today for surgery, with the diagnosis of C50.412, Z17.0 Malignant neoplasm of upper outer quadrant of lt breast in female, estrogen receptor positive.  The various methods of treatment have been discussed with the patient and family. After consideration of risks, benefits and other options for treatment, the patient has consented to  Procedure(s): PART [place] (Left) as a surgical intervention.  The patient's history has been reviewed, patient examined, no change in status, stable for surgery.  I have reviewed the patient's chart and labs.  Questions were answered to the patient's satisfaction.     Herbert Pun

## 2020-11-22 NOTE — Anesthesia Preprocedure Evaluation (Signed)
Anesthesia Evaluation  Patient identified by MRN, date of birth, ID band Patient awake    Reviewed: Allergy & Precautions, NPO status , Patient's Chart, lab work & pertinent test results  History of Anesthesia Complications Negative for: history of anesthetic complications  Airway Mallampati: II  TM Distance: <3 FB Neck ROM: Full    Dental  (+) Poor Dentition, Implants   Pulmonary neg sleep apnea, neg COPD,    breath sounds clear to auscultation- rhonchi (-) wheezing      Cardiovascular Exercise Tolerance: Good hypertension, Pt. 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 Home test (+). No date: Hypertension No date: Meningitis     Comment:  in childhood No date: Obesity (BMI 30-39.9) No date: Overactive bladder No date: Seizure [facility])     Comment:  last seizure 2000   Reproductive/Obstetrics                             Anesthesia Physical Anesthesia Plan  ASA: 2  Anesthesia Plan: General   Post-op Pain Management:    Induction: Intravenous  PONV Risk Score and Plan: 2 and Ondansetron and  Dexamethasone  Airway Management Planned: LMA  Additional Equipment:   Intra-op Plan:   Post-operative Plan:   Informed Consent: I have reviewed the patients History and Physical, chart, labs and discussed the procedure including the risks, benefits and alternatives for the proposed anesthesia with the patient or authorized representative who has indicated his/her understanding and acceptance.     Dental advisory given  Plan Discussed with: CRNA and Anesthesiologist  Anesthesia Plan Comments:         Anesthesia Quick Evaluation

## 2020-11-22 NOTE — Op Note (Signed)
Preoperative diagnosis: Left breast carcinoma.  Postoperative diagnosis: Same.   Procedure: Left radiofrequency tag-localized partial mastectomy.                      Left Axillary Sentinel Lymph node biopsy  Anesthesia: GETA  Surgeon: Dr. Windell Moment  Wound Classification: Clean  Indications: Patient is a 70 y.o. female with a nonpalpable left breast mass noted on mammography with core biopsy demonstrating invasive mammary carcinoma requires radiofrequency tag-localized partial mastectomy for treatment with sentinel lymph node biopsy.   Findings: 1. Specimen mammography shows marker and tag on specimen 2. Pathology call refers gross examination of margins was close to posterior margin. Re excision of posterior margins done 3. No other palpable mass or lymph node identified.   Description of procedure: Preoperative radiofrequency tag localization was performed by radiology. In the nuclear medicine suite, the subareolar region was injected with Tc-99 sulfur colloid. Localization studies were reviewed. The patient was taken to the operating room and placed supine on the operating table, and after general anesthesia the left chest and axilla were prepped and draped in the usual sterile fashion. A time-out was completed verifying correct patient, procedure, site, positioning, and implant(s) and/or special equipment prior to beginning this procedure.  By comparing the localization studies and interrogation with Localizer device, the probable trajectory and location of the mass was visualized. A circumareolar skin incision was planned in such a way as to minimize the amount of dissection to reach the mass.  The skin incision was made. Flaps were raised and the location of the tag was confirmed with Localizer device confirmed. A 2-0 silk figure-of-eight stay suture was placed and used for retraction. Dissection was then taken down circumferentially, taking care to include the entire localizing tag  and a wide margin of grossly normal tissue. The specimen and entire localizing tag were removed. The specimen was oriented and sent to radiology with the localization studies. Confirmation was received that the entire target lesion had been resected. The wound was irrigated. Hemostasis was checked. The wound was closed with interrupted sutures of 3-0 Vicryl and a subcuticular suture of Monocryl 3-0. No attempt was made to close the dead space.   A hand-held gamma probe was used to identify the location of the hottest spot in the axilla. An incision was made around the caudal axillary hairline. Dissection was carried down until subdermal facias was advanced. The probe was placed and again, the point of maximal count was found. Dissection continue until nodule was identified. The probe was placed in contact with the node. The node was excised in its entirety.  An additional hot spot was detected and the node was excised in similar fashion. No additional hot spots were identified. No clinically abnormal nodes were palpated. The procedure was terminated. Hemostasis was achieved and the wound closed in layers with deep interrupted 3-0 Vicryl and skin was closed with subcuticular suture of Monocryl 3-0.  The patient tolerated the procedure well and was taken to the postanesthesia care unit in stable condition.   Sentinel Node Biopsy Synoptic Operative Report  Operation performed with curative intent:Yes  Tracer(s) used to identify sentinel nodes in the upfront surgery (non-neoadjuvant) setting (select all that apply):Radioactive Tracer  Tracer(s) used to identify sentinel nodes in the neoadjuvant setting (select all that apply):N/A  All nodes (colored or non-colored) present at the end of a dye-filled lymphatic channel were removed:N/A  All significantly radioactive nodes were removed:Yes  All palpable suspicious nodes were  removed:N/A  Biopsy-proven positive nodes marked with clips prior to  chemotherapy were identified and removed:N/A  Specimen: Left Breast mass                     Sentinel Lymph nodes #1, #2                    Re excision of posterior margin  Complications: None  Estimated Blood Loss: 10 mL

## 2020-11-22 NOTE — Anesthesia Procedure Notes (Signed)
Procedure Name: LMA Insertion Date/Time: 11/22/2020 1:42 PM Performed by: Debe Coder, CRNA Pre-anesthesia Checklist: Patient identified, Emergency Drugs available, Suction available and Patient being monitored Patient Re-evaluated:Patient Re-evaluated prior to induction Oxygen Delivery Method: Circle system utilized Preoxygenation: Pre-oxygenation with 100% oxygen Induction Type: IV induction Ventilation: Mask ventilation without difficulty LMA: LMA inserted LMA Size: 4.0 Number of attempts: 1 Placement Confirmation: positive ETCO2 and breath sounds checked- equal and bilateral Tube secured with: Tape Dental Injury: Teeth and Oropharynx as per pre-operative assessment

## 2020-11-22 NOTE — Discharge Instructions (Addendum)
  Diet: Resume home heart healthy regular diet.   Activity: No heavy lifting >20 pounds (children, pets, laundry, garbage) or strenuous activity until follow-up, but light activity and walking are encouraged. Do not drive or drink alcohol if taking narcotic pain medications.  Wound care: May shower with soapy water and pat dry (do not rub incisions), but no baths or submerging incision underwater until follow-up. (no swimming)   Medications: Resume all home medications. For mild to moderate pain: acetaminophen (Tylenol) or ibuprofen (if no kidney disease). Combining Tylenol with alcohol can substantially increase your risk of causing liver disease. Narcotic pain medications, if prescribed, can be used for severe pain, though may cause nausea, constipation, and drowsiness. Do not combine Tylenol and Norco within a 6 hour period as Norco contains Tylenol. If you do not need the narcotic pain medication, you do not need to fill the prescription.  Call office [phone]) at any time if any questions, worsening pain, fevers/chills, bleeding, drainage from incision site, or other concerns.   AMBULATORY SURGERY  DISCHARGE INSTRUCTIONS   The drugs that you were given will stay in your system until tomorrow so for the next 24 hours you should not:  Drive an automobile Make any legal decisions Drink any alcoholic beverage   You may resume regular meals tomorrow.  Today it is better to start with liquids and gradually work up to solid foods.  You may eat anything you prefer, but it is better to start with liquids, then soup and crackers, and gradually work up to solid foods.   Please notify your doctor immediately if you have any unusual bleeding, trouble breathing, redness and pain at the surgery site, drainage, fever, or pain not relieved by medication.    Additional Instructions:  1 part rubbing alcohol 2 parts water in ziplock bag ( double bag) wrap in cloth and apply to  incision   Please contact your physician with any problems or Same Day Surgery at [phone], Monday through Friday 6 am to 4 pm, or [place] at [facility] number at [phone].

## 2020-11-22 NOTE — Transfer of Care (Signed)
Immediate Anesthesia Transfer of Care Note  Patient: Tamara Mejia  Procedure(s) Performed: PART MASTECTOMY, RADIO FREQUENCY LOCALIZER,AXILLARY SENTINEL NODE BIOPSY (Left: Breast)  Patient Location: PACU  Anesthesia Type:General  Level of Consciousness: drowsy  Airway & Oxygen Therapy: Patient Spontanous Breathing and Patient connected to face mask oxygen  Post-op Assessment: Report given to RN and Post -op Vital signs reviewed and stable  Post vital signs: Reviewed and stable  Last Vitals:  Vitals Value Taken Time  BP 123/61 11/22/20 1530  Temp    Pulse 80 11/22/20 1533  Resp 20 11/22/20 1533  SpO2 100 % 11/22/20 1533  Vitals shown include unvalidated device data.  Last Pain:  Vitals:   11/22/20 1106  TempSrc: Oral  PainSc: 0-No pain         Complications: No notable events documented.

## 2020-11-25 ENCOUNTER — Encounter: Payer: Self-pay | Admitting: General Surgery

## 2020-11-25 NOTE — Anesthesia Postprocedure Evaluation (Signed)
Anesthesia Post Note  Patient: Tamara Mejia  Procedure(s) Performed: PART MASTECTOMY, RADIO FREQUENCY LOCALIZER,AXILLARY SENTINEL NODE BIOPSY (Left: Breast)  Patient location during evaluation: PACU Anesthesia Type: General Level of consciousness: awake and alert, oriented and patient cooperative Pain management: pain level controlled Vital Signs Assessment: post-procedure vital signs reviewed and stable Respiratory status: spontaneous breathing, nonlabored ventilation and respiratory function stable Cardiovascular status: blood pressure returned to baseline and stable Postop Assessment: adequate PO intake Anesthetic complications: no   No notable events documented.   Last Vitals:  Vitals:   11/22/20 1558 11/22/20 1626  BP: 125/61 122/70  Pulse: 75 72  Resp: 15 16  Temp: (!) 36.4 C (!) 36.1 C  SpO2: 97% 96%    Last Pain:  Vitals:   11/22/20 1626  TempSrc: Temporal  PainSc: 0-No pain                 Darrin Nipper

## 2020-11-27 ENCOUNTER — Other Ambulatory Visit: Payer: Self-pay | Admitting: Anatomic Pathology & Clinical Pathology

## 2020-11-27 LAB — SURGICAL PATHOLOGY

## 2020-12-03 ENCOUNTER — Telehealth: Payer: Self-pay

## 2020-12-03 NOTE — Telephone Encounter (Signed)
-----   Message from Earlie Server, MD sent at 12/03/2020  8:03 AM EST ----- Please send mammaprint for her breast cancer specimen [phone] And arrange patient to see me 2 weeks after mammaprint is sent.

## 2020-12-03 NOTE — Telephone Encounter (Signed)
Mammaprint faxed to [facility] please schedule MD week of 12/5. Thanks

## 2020-12-03 NOTE — Telephone Encounter (Signed)
Pt scheduled 12/5. Spoke with caregiver

## 2020-12-10 ENCOUNTER — Encounter: Payer: Self-pay | Admitting: Oncology

## 2020-12-11 ENCOUNTER — Other Ambulatory Visit: Payer: Self-pay

## 2020-12-11 ENCOUNTER — Inpatient Hospital Stay: Payer: Medicare Other

## 2020-12-11 ENCOUNTER — Inpatient Hospital Stay: Payer: Medicare Other | Attending: Oncology | Admitting: Licensed Clinical Social Worker

## 2020-12-11 ENCOUNTER — Encounter: Payer: Self-pay | Admitting: Licensed Clinical Social Worker

## 2020-12-11 ENCOUNTER — Encounter: Payer: Self-pay | Admitting: Oncology

## 2020-12-11 DIAGNOSIS — Z803 Family history of malignant neoplasm of breast: Secondary | ICD-10-CM | POA: Diagnosis not present

## 2020-12-11 DIAGNOSIS — C50912 Malignant neoplasm of unspecified site of left female breast: Secondary | ICD-10-CM

## 2020-12-11 NOTE — Telephone Encounter (Signed)
Mammaprint results scanned in Media.

## 2020-12-11 NOTE — Progress Notes (Signed)
REFERRING PROVIDER: Earlie Server, MD [place],  [place] [address]  PRIMARY PROVIDER:  Kirk Ruths, MD  PRIMARY REASON FOR VISIT:  1. Malignant neoplasm of left female breast, unspecified estrogen receptor status, unspecified site of breast ([place])   2. Family history of breast cancer      HISTORY OF PRESENT ILLNESS:   Tamara Mejia, a 70 y.o. female, was seen for a [place] cancer genetics consultation at the request of Dr. Tasia Catchings due to a personal and family history of cancer.  Ms. Dragovich presents to clinic today to discuss the possibility of a hereditary predisposition to cancer, genetic testing, and to further clarify her future cancer risks, as well as potential cancer risks for family members.   In 2022, at the age of 48, Ms. Deacon was diagnosed with invasive mammary carcinoma of the left breast, ER/PR+, HER2-. The treatment plan includes lumpectomy which was completed on 11/22/2020, possible adjuvant chemotherapy and adjuvant radiation and endocrine therapy.  CANCER HISTORY:  Oncology History  Breast cancer ([place])  10/28/2020 Initial Diagnosis   Breast cancer ([place])   10/28/2020 Cancer Staging   Staging form: Breast, AJCC 8th Edition - Clinical stage from 10/28/2020: Stage IA (cT1, cN0, cM0, G3, ER+, PR+, HER2-) - Signed by Earlie Server, MD on 10/28/2020 Stage prefix: Initial diagnosis Histologic grading system: 3 grade system       RISK FACTORS:  Ovaries intact: no.  Hysterectomy: yes. As a teenager.  Menopausal status: postmenopausal.  HRT use: 0 years. Colonoscopy: yes;  reports normal . Mammogram within the last year: yes. Number of breast biopsies: 2.  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 Home test (+).   Hypertension    Meningitis    in childhood   Obesity (BMI 30-39.9)    Overactive bladder    Seizure ([place])    last seizure 2000    Past Surgical History:  Procedure Laterality Date   ABDOMINAL HYSTERECTOMY     as a teenager   BREAST BIOPSY Right    neg   BREAST BIOPSY Left 10/18/2020   u/s breast  biopsy, "venus" clip-INVASIVE MAMMARY CARCINOMA   CHOLECYSTECTOMY     COLONOSCOPY WITH PROPOFOL N/A 08/27/2015   Procedure: COLONOSCOPY WITH PROPOFOL;  Surgeon: Lucilla Lame, MD;  Location: [facility] ENDOSCOPY;  Service: Endoscopy;  Laterality: N/A;   PART MASTECTOMY,RADIO FREQUENCY LOCALIZER,AXILLARY SENTINEL NODE BIOPSY Left 11/22/2020   Procedure: PART MASTECTOMY, RADIO FREQUENCY LOCALIZER,AXILLARY SENTINEL NODE BIOPSY;  Surgeon: Herbert Pun, MD;  Location: [facility] ORS;  Service: General;  Laterality: Left;    Social History   Socioeconomic History   Marital status: Single    Spouse name: Not on file   Number of children: Not on file   Years of education: Not on file   Highest education level: Not on file  Occupational History   Not on file  Tobacco Use   Smoking status: Never   Smokeless tobacco: Never  Substance and Sexual Activity   Alcohol use: No   Drug use: No   Sexual activity: Not on file  Other Topics Concern   Not on  file  Social History Narrative   Patient works for [employer]   Patient has her adult education certificate   Patient drinks about 2 cups of coffee daily.          Social Determinants of Health   Financial Resource Strain: Not on file  Food Insecurity: Not on file  Transportation Needs: Not on file  Physical Activity: Not on file  Stress: Not on file  Social Connections: Not on file     FAMILY HISTORY:  We obtained a detailed, 4-generation family history.  Significant diagnoses are listed below: Family History  Problem Relation Age of Onset   Diabetes  Father    Heart disease Father    Heart disease Brother    Breast cancer Cousin        dx 18s   Ms. Toral had 1 full brother, he passed in his 51s of a heart attack. She has a paternal half sister, 51, no history of cancer.  Ms. Goodwill biological mother died in her 56s-50s, she did not have siblings. Maternal grandmother died in her 24s of Alzheimer's, and her sister's daughter, [name], had breast cancer recently diagnosed in her 23s. No information about maternal grandfather.   Ms. Seaborn father died in his 44s. Patient had 1 paternal aunt, no cancer. No cancer in cousins. Paternal grandparents passed in their 20s with no history of cancer.  Ms. Barno is unaware of previous family history of genetic testing for hereditary cancer risks. Patient's maternal ancestors are of unknown descent, and paternal ancestors are of French/Dutch/unknown descent. There is no reported Ashkenazi Jewish ancestry. There is no known consanguinity.    GENETIC COUNSELING ASSESSMENT: Ms. Hentges is a 70 y.o. female with a personal and family history of breast cancer which is somewhat suggestive of a hereditary cancer syndrome and predisposition to cancer. We, therefore, discussed and recommended the following at today's visit.   DISCUSSION: We discussed that approximately 10% of breast cancer is hereditary. Most cases of hereditary breast cancer are associated with BRCA1/BRCA2 genes, although there are other genes associated with hereditary cancer as well. Cancers and risks are gene specific.  We discussed that testing is beneficial for several reasons including knowing about other cancer risks, identifying potential screening and risk-reduction options that may be appropriate, and to understand if other family members could be at risk for cancer and allow them to undergo genetic testing.   We reviewed the characteristics, features and inheritance patterns of hereditary cancer syndromes. We also discussed genetic  testing, including the appropriate family members to test, the process of testing, insurance coverage and turn-around-time for results. We discussed the implications of a negative, positive and/or variant of uncertain significant result. We recommended Ms. Kielty pursue genetic testing for the [facility] Common Hereditary Cancers+RNA gene panel.   The Common Hereditary Cancers Panel + RNA offered by [facility] includes sequencing and/or deletion duplication testing of the following 47 genes: APC, ATM, AXIN2, BARD1, BMPR1A, BRCA1, BRCA2, BRIP1, CDH1, CDKN2A (p14ARF), CDKN2A (p16INK4a), CKD4, CHEK2, CTNNA1, DICER1, EPCAM (Deletion/duplication testing only), GREM1 (promoter region deletion/duplication testing only), KIT, MEN1, MLH1, MSH2, MSH3, MSH6, MUTYH, NBN, NF1, NHTL1, PALB2, PDGFRA, PMS2, POLD1, POLE, PTEN, RAD50, RAD51C, RAD51D, SDHB, SDHC, SDHD, SMAD4, SMARCA4. STK11, TP53, TSC1, TSC2, and VHL.  The following genes were evaluated for sequence changes only: SDHA and HOXB13 c.251G>A variant only.  Based on Ms. Clagg's personal and family history of cancer, she meets medical criteria for genetic testing. Despite that she meets criteria, she may  still have an out of pocket cost. We discussed that if her out of pocket cost for testing is over $100, the laboratory will call and confirm whether she wants to proceed with testing.  If the out of pocket cost of testing is less than $100 she will be billed by the genetic testing laboratory.   PLAN: After considering the risks, benefits, and limitations, Ms. Edberg provided informed consent to pursue genetic testing and the blood sample was sent to [facility] for analysis of the Common Hereditary Cancers+RNA panel. Results should be available within approximately 2-3 weeks' time, at which point they will be disclosed by telephone to Ms. Sayegh, as will any additional recommendations warranted by these results. Ms. Rorke will receive a summary of her genetic  counseling visit and a copy of her results once available. This information will also be available in Epic.   Ms. Jared questions were answered to her satisfaction today. Our contact information was provided should additional questions or concerns arise. Thank you for the referral and allowing [place] to share in the care of your patient.   Faith Rogue, MS, [facility] Genetic Counselor [email or web address] Phone: [phone]  The patient was seen for a total of 25 minutes in face-to-face genetic counseling.  Patient was seen with caregiver Tasha. Dr. Grayland Ormond was available for discussion regarding this case.   _______________________________________________________________________ For Office Staff:  Number of people involved in session: 2 Was an Intern/ student involved with case: no

## 2020-12-23 ENCOUNTER — Other Ambulatory Visit: Payer: Self-pay

## 2020-12-23 ENCOUNTER — Inpatient Hospital Stay: Payer: Medicare Other | Attending: Oncology | Admitting: Oncology

## 2020-12-23 ENCOUNTER — Encounter: Payer: Self-pay | Admitting: Oncology

## 2020-12-23 VITALS — BP 144/94 | HR 85 | Temp 97.4°F | Resp 18 | Wt 194.4 lb

## 2020-12-23 DIAGNOSIS — E860 Dehydration: Secondary | ICD-10-CM | POA: Diagnosis not present

## 2020-12-23 DIAGNOSIS — E871 Hypo-osmolality and hyponatremia: Secondary | ICD-10-CM | POA: Insufficient documentation

## 2020-12-23 DIAGNOSIS — F79 Unspecified intellectual disabilities: Secondary | ICD-10-CM | POA: Insufficient documentation

## 2020-12-23 DIAGNOSIS — Z170 Estrogen receptor positive status [ER+]: Secondary | ICD-10-CM | POA: Insufficient documentation

## 2020-12-23 DIAGNOSIS — Z803 Family history of malignant neoplasm of breast: Secondary | ICD-10-CM | POA: Insufficient documentation

## 2020-12-23 DIAGNOSIS — E119 Type 2 diabetes mellitus without complications: Secondary | ICD-10-CM | POA: Diagnosis not present

## 2020-12-23 DIAGNOSIS — C50912 Malignant neoplasm of unspecified site of left female breast: Secondary | ICD-10-CM | POA: Diagnosis present

## 2020-12-23 DIAGNOSIS — Z90710 Acquired absence of both cervix and uterus: Secondary | ICD-10-CM | POA: Diagnosis not present

## 2020-12-23 DIAGNOSIS — E1165 Type 2 diabetes mellitus with hyperglycemia: Secondary | ICD-10-CM | POA: Diagnosis not present

## 2020-12-23 DIAGNOSIS — R197 Diarrhea, unspecified: Secondary | ICD-10-CM | POA: Diagnosis not present

## 2020-12-23 DIAGNOSIS — I10 Essential (primary) hypertension: Secondary | ICD-10-CM | POA: Insufficient documentation

## 2020-12-23 DIAGNOSIS — Z5111 Encounter for antineoplastic chemotherapy: Secondary | ICD-10-CM | POA: Diagnosis not present

## 2020-12-23 DIAGNOSIS — Z79899 Other long term (current) drug therapy: Secondary | ICD-10-CM

## 2020-12-23 DIAGNOSIS — Z7189 Other specified counseling: Secondary | ICD-10-CM

## 2020-12-23 DIAGNOSIS — Z5181 Encounter for therapeutic drug level monitoring: Secondary | ICD-10-CM | POA: Insufficient documentation

## 2020-12-23 MED ORDER — PROCHLORPERAZINE MALEATE 10 MG PO TABS: 10.0000 mg | ORAL_TABLET | Freq: Four times a day (QID) | ORAL | 1 refills | Status: DC | PRN

## 2020-12-23 MED ORDER — ONDANSETRON HCL 8 MG PO TABS: 8.0000 mg | ORAL_TABLET | Freq: Two times a day (BID) | ORAL | 1 refills | Status: DC | PRN

## 2020-12-23 MED ORDER — DEXAMETHASONE 4 MG PO TABS: 8.0000 mg | ORAL_TABLET | Freq: Two times a day (BID) | ORAL | 1 refills | Status: DC

## 2020-12-23 MED ORDER — LIDOCAINE-PRILOCAINE 2.5-2.5 % EX CREA: TOPICAL_CREAM | CUTANEOUS | 3 refills | Status: DC

## 2020-12-23 NOTE — Progress Notes (Signed)
START ON PATHWAY REGIMEN - Breast     A cycle is every 21 days:     Docetaxel      Cyclophosphamide   **Always confirm dose/schedule in your pharmacy ordering system**  Patient Characteristics: Postoperative without Neoadjuvant Therapy (Pathologic Staging), Invasive Disease, Adjuvant Therapy, HER2 Negative/Unknown/Equivocal, ER Positive, Node Positive, Node Positive (1-3), MammaPrint(R) Ordered, High Genomic Risk Therapeutic Status: Postoperative without Neoadjuvant Therapy (Pathologic Staging) AJCC Grade: G3 AJCC N Category: pN1a AJCC M Category: cM0 ER Status: Positive (+) AJCC 8 Stage Grouping: IB HER2 Status: Negative (-) Oncotype Dx Recurrence Score: Ordered Other Genomic Test AJCC T Category: pT1c PR Status: Positive (+) Adjuvant Therapy Status: No Adjuvant Therapy Received Yet or Changing Initial Adjuvant Regimen due to Tolerance Has this patient completed genomic testing<= Yes - MammaPrint(R) MammaPrint(R) Score: High Genomic Risk Intent of Therapy: Curative Intent, Discussed with Patient

## 2020-12-23 NOTE — Progress Notes (Signed)
Pt here for follow up. No new concerns voiced.   

## 2020-12-23 NOTE — Addendum Note (Signed)
Addended by: Earlie Server on: 12/23/2020 09:26 PM   Modules accepted: Orders

## 2020-12-23 NOTE — Progress Notes (Addendum)
Hematology/Oncology Progress Note [phone] [phone]   Patient Care Team: Kirk Ruths, MD as PCP - General (Internal Medicine)  REFERRING PROVIDER: Kirk Ruths, MD  CHIEF COMPLAINTS/REASON FOR VISIT:  Evaluation of left breast cancer  HISTORY OF PRESENTING ILLNESS:   Tamara Mejia is a  70 y.o.  female with PMH listed below was seen in consultation at the request of  Kirk Ruths, MD  for evaluation of left breast cancer.  She has screening mammogram done which recommends additional left breast diagnostic mammogram.  09/26/2020 unilateral left diagnostic mammogram showed there is a suspicious 17 mm mass in the LEFT upper breast which is concerning for malignancy. Recommend ultrasound-guided biopsy for definitive characterization. No suspicious LEFT axillary adenopathy.  10/08/2020 left breast US guided biopsy showed invasive mammary carcinoma, no special type. Grade 3, no DCIS, no LVI.  ER 51-90%, PR 51-90%, HER2 negative.   Family history of breast cancer: maternal cousin breast cancer. Great aunt breast cancer.  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  lives in a group home, and she is accompanied by  facility staff today. She has a guardian, Geraldo Pitter who is her maternal cousin.   Review of Systems  Constitutional:  Negative for appetite change, chills, fatigue and fever.  HENT:   Negative for hearing loss and voice change.   Eyes:  Negative for eye problems.  Respiratory:  Negative for chest tightness and cough.   Cardiovascular:  Negative for chest pain.  Gastrointestinal:  Negative for abdominal distention, abdominal pain and blood in stool.  Endocrine:  Negative for hot flashes.  Genitourinary:  Negative for difficulty urinating and frequency.   Musculoskeletal:  Negative for arthralgias.  Skin:  Negative for itching and rash.  Neurological:  Negative for extremity weakness.  Hematological:  Negative for adenopathy.  Psychiatric/Behavioral:  Negative for confusion.    MEDICAL HISTORY:  Past Medical History:  Diagnosis Date   Breast cancer ([place])    Cognitive dysfunction    mental retardation (mild to moderate)   Diabetes mellitus, type 2 (HCC)    diet controlled   Family history of breast cancer    High cholesterol    History of 2019 novel coronavirus disease (COVID-19) 11/04/2020   a.) resides in group home; others were sick with SARS-Cov-2. Home test (+).   Hypertension    Meningitis    in childhood   Obesity (BMI 30-39.9)    Overactive bladder    Seizure ([place])    last seizure 2000    SURGICAL HISTORY: Past Surgical History:  Procedure Laterality Date   ABDOMINAL HYSTERECTOMY     as a teenager   BREAST BIOPSY Right    neg   BREAST BIOPSY Left 10/18/2020   u/s breast  biopsy, "venus" clip-INVASIVE MAMMARY CARCINOMA   CHOLECYSTECTOMY     COLONOSCOPY WITH PROPOFOL N/A 08/27/2015   Procedure: COLONOSCOPY WITH PROPOFOL;  Surgeon: Lucilla Lame, MD;  Location: [facility];  Service: Endoscopy;  Laterality: N/A;   PART MASTECTOMY,RADIO FREQUENCY LOCALIZER,AXILLARY SENTINEL NODE BIOPSY Left 11/22/2020   Procedure: PART MASTECTOMY, RADIO FREQUENCY LOCALIZER,AXILLARY SENTINEL NODE BIOPSY;  Surgeon: Herbert Pun, MD;  Location: [facility] ORS;  Service: General;  Laterality: Left;    SOCIAL HISTORY: Social History  Socioeconomic History   Marital status: Single    Spouse name: Not on file   Number of children: Not on file   Years of education: Not on file   Highest education level: Not on file  Occupational History   Not on file  Tobacco Use   Smoking status: Never   Smokeless tobacco: Never  Substance and Sexual  Activity   Alcohol use: No   Drug use: No   Sexual activity: Not on file  Other Topics Concern   Not on file  Social History Narrative   Patient works for cutting [employer]   Patient has her adult education certificate   Patient drinks about 2 cups of coffee daily.          Social Determinants of Health   Financial Resource Strain: Not on file  Food Insecurity: Not on file  Transportation Needs: Not on file  Physical Activity: Not on file  Stress: Not on file  Social Connections: Not on file  Intimate Partner Violence: Not on file    FAMILY HISTORY: Family History  Problem Relation Age of Onset   Diabetes Father    Heart disease Father    Heart disease Brother    Breast cancer Cousin        dx 8s    ALLERGIES:  has No Known Allergies.  MEDICATIONS:  Current Outpatient Medications  Medication Sig Dispense Refill   amLODipine (NORVASC) 5 MG tablet Take 5 mg by mouth daily.     aspirin 81 MG EC tablet Take 81 mg by mouth daily. Swallow whole.     carbamazepine (TEGRETOL) 200 MG tablet TAKE ONE TABLET BY MOUTH THREE TIMES DAILY. (SEIZURE CONTROL) 90 tablet 11   Cholecalciferol (VITAMIN D) 50 MCG (2000 UT) tablet Take 4,000 Units by mouth daily.     lisinopril-hydrochlorothiazide (ZESTORETIC) 20-12.5 MG tablet Take 1 tablet by mouth daily.      simvastatin (ZOCOR) 20 MG tablet Take 20 mg by mouth daily.      No current facility-administered medications for this visit.     PHYSICAL EXAMINATION: ECOG PERFORMANCE STATUS: 1 - Symptomatic but completely ambulatory Vitals:   12/23/20 1050  BP: (!) 144/94  Pulse: 85  Resp: 18  Temp: (!) 97.4 F (36.3 C)   Filed Weights   12/23/20 1050  Weight: 194 lb 6.4 oz (88.2 kg)    Physical Exam Constitutional:      General: She is not in acute distress. HENT:     Head: Normocephalic and atraumatic.  Eyes:     General: No scleral icterus. Cardiovascular:     Rate and Rhythm: Normal rate and regular rhythm.     Heart  sounds: Normal heart sounds.  Pulmonary:     Effort: Pulmonary effort is normal. No respiratory distress.     Breath sounds: No wheezing.  Abdominal:     General: Bowel sounds are normal. There is no distension.     Palpations: Abdomen is soft.  Musculoskeletal:        General: No deformity. Normal range of motion.     Cervical back: Normal range of motion and neck supple.  Skin:    General: Skin is warm and dry.     Findings: No erythema or rash.  Neurological:     Mental Status: She is alert and oriented to person, place, and time. Mental status is at baseline.     Cranial Nerves: No cranial nerve deficit.     Coordination: Coordination normal.  Psychiatric:        Mood and Affect: Mood normal.  Breast exam was performed in seated and lying down position. Left breast upper ourter qudrant palapable breast mass.  No palpable breast mass in right breast. No lymphadenopathy of axillary bilaterally.    LABORATORY DATA:  I have reviewed the data as listed Lab Results  Component Value Date   WBC 4.7 10/28/2020   HGB 13.5 10/28/2020   HCT 39.7 10/28/2020   MCV 91.9 10/28/2020   PLT 235 10/28/2020   Recent Labs    12/28/19 1000 10/28/20 1555  NA 140 136  K 4.2 4.0  CL 99 99  CO2 27 31  GLUCOSE 115* 164*  BUN 15 14  CREATININE 0.75 0.71  CALCIUM 9.4 8.9  GFRNONAA 83 >60  GFRAA 95  --   PROT 7.0 7.8  ALBUMIN 4.6 4.3  AST 20 23  ALT 17 23  ALKPHOS 84 75  BILITOT <0.2 <0.1*    Iron/TIBC/Ferritin/ %Sat No results found for: IRON, TIBC, FERRITIN, IRONPCTSAT    RADIOGRAPHIC STUDIES: I have personally reviewed the radiological images as listed and agreed with the findings in the report. No results found.    ASSESSMENT & PLAN:  1. Malignant neoplasm of left female breast, unspecified estrogen receptor status, unspecified site of breast ([place])   2. Encounter for monitoring cardiotoxic drug therapy   3. Goals of care, counseling/discussion   4. Malignant neoplasm of  left breast in female, estrogen receptor positive, unspecified site of breast [facility])    Cancer Staging  Breast cancer [facility]) Staging form: Breast, AJCC 8th Edition - Pathologic stage from 12/23/2020: Stage IB (pT1c, pN1a, cM0, G3, ER+, PR+, HER2-) - Signed by Earlie Server, MD on 12/23/2020  # Stage IB left breast cancer, ER/PR+, HER2 - pT1c pN1a M0 Mammaprint came back high risk, 29% recurrence risk in 10 years.  I had a lengthy discussion with patient's guardian, Ms.Karlene Lineman. The diagnosis and care plan were discussed with patient's guardian in detail.     The goal of treatment with curative intent. We discussed about the rationale of adjuvant chemotherapy.  Using anthracycline contained regimen ddAC x 4 followed by 12 weekly treatment of Taxol versus non anthracycline regimen docetaxel/Cytoxan every 3 weeks for 4 doses. In general anthracycline, non anthracycline regimen may be inferior comparing to anthracycline regimen. Given patient's mentality, multiple medical condition, shared decision was made between patient's guardian and me that patient will go on nonanthracycline regimen with docetaxel/Cytoxan.  We discussed about the composition of the chemotherapy regimen, length of chemotherapy, duration of treatment and the time to assess response to treatment.   I explained to Ms.Karlene Lineman about the risks and benefits of chemotherapy including all but not limited to hair loss, mouth sore, nausea, vomiting, diarrhea, low blood counts, bleeding, neuropathy and risk of life threatening infection and even death,etc.  Long acting GCSF will be given for prophylaxis.  Ms.Nash agree with the plan and is willing for patient to proceed.    # will set up Chemotherapy education; According to patient and guardian's preference, will ask Dr.Cintron to place Medi- port placement. Antiemetics-Zofran and Compazine,Dexamethasone; EMLA cream sent to pharmacy.  I recommend patient to take Dexamethasone 1 day prior to chemo, and 2  days after chemo.   # Patient's guardian prefers patient to get acupressure massage treatments. I raised the concerns of possible skin infection as patient's immune system may be very low and is susceptible for infection. Guardian will further discuss with  acupressure practitioner.   Supportive care measures are necessary for patient well-being and will be provided as necessary. All questions are answered.  Orders Placed This Encounter  Procedures   ECHOCARDIOGRAM COMPLETE    Standing Status:   Future    Standing Expiration Date:   12/23/2021    Order Specific Question:   Where should this test be performed    Answer:   [facility]    Order Specific Question:   Perflutren DEFINITY (image enhancing agent) should be administered unless hypersensitivity or allergy exist    Answer:   Administer Perflutren    Order Specific Question:   Reason for exam-Echo    Answer:   Chemo  Z09    Order Specific Question:   Other Comments    Answer:   Pre chemo evaluation    All questions were answered. The patient knows to call the clinic with any problems questions or concerns.  cc Kirk Ruths, MD    Return of visit: 1-2 weeks, lab MD cycle 1 TC with GCSF  We spent sufficient time to discuss many aspect of care, questions were answered to patient's satisfaction. A total of 73 minutes was spent on this visit. I spent 20 minutes on evaluation of patient, discussion with her caregiver, coordination of her care. Guardian Ms.Karlene Lineman was not available during her visit for discussion. I called Ms Karlene Lineman around 8:04pm on 12/23/2020 and I spent 53 minutes on the phone with her, providing counseling on the diagnosis, chemotherapy treatments,  side effects of the treatment, management of symptoms and answering questions.   Earlie Server, MD, PhD 12/23/2020

## 2020-12-24 ENCOUNTER — Telehealth: Payer: Self-pay

## 2020-12-24 ENCOUNTER — Telehealth: Payer: Self-pay | Admitting: Oncology

## 2020-12-24 NOTE — Telephone Encounter (Signed)
Tyrone Apple would like an email with the drugs that the pt will be receiving to her email [email or web address]. Waiting further instructions on Chemo Education call in with Lissa Merlin via Magdalene Patricia secure chat and chemo consent being emailed due to Karlene Lineman not being within the state via Adline Potter via secure chat. Karlene Lineman also had questions regarding when port will be placed. I advised her someone from Dr. Ethlyn Daniels office would call her with that information.

## 2020-12-24 NOTE — Telephone Encounter (Signed)
-----   Message from Earlie Server, MD sent at 12/23/2020  9:21 PM EST ----- Please arrange patient to have chemotherapy class with docetaxel/Cytoxan.  Please call caregiver for appointments.  Patient's guardian Ms. Karlene Lineman would like to be called during the class.  She also prefers the chemotherapy consent to be emailed to her for signature. Please also provide her contact of Anne shaver.   I will contact Dr. Peyton Najjar to get her port placed. Lab MD 1 to 2 weeks for cycle of docetaxel/Cytoxan/on pro

## 2020-12-24 NOTE — Telephone Encounter (Addendum)
I spoke to facility that pt is currently at, they were unable to do ECHO 01/01/21. They were going to call radiology themselves to get it scheduled. They are aware of the Chemo education class 12/30/20. I am unable to get in touch with Legal Guardian, Tamara Mejia to discuss further treatment appts. Will try to reach again. Tamara Mejia is not currently residing in [facility] and is unable to sign consents or be present at appts with pt. Working on coordinating solutions for chemo consent signing.

## 2020-12-24 NOTE — Telephone Encounter (Incomplete Revision)
I spoke to facility that pt is currently at, they were unable to do ECHO 01/01/21. They were going to call radiology themselves to get it scheduled. They are aware of the Chemo education class 12/30/20. I am unable to get in touch with Legal Guardian, Tamara Mejia to discuss further treatment appts. Will try to reach again. Tamara Mejia is not currently residing in [facility] and is unable to sign consents or be present at appts with pt. Working on coordinating solutions for chemo consent signing.

## 2020-12-25 ENCOUNTER — Telehealth: Payer: Self-pay

## 2020-12-25 ENCOUNTER — Telehealth: Payer: Self-pay | Admitting: Oncology

## 2020-12-25 NOTE — Telephone Encounter (Signed)
Called and spoke to Nelson about premeds that were sent to pharmacy. Detailed explanation given about instructions for medications. Also told her that she can bring them to chemo edu and Basilia Jumbo can explain to her as well.

## 2020-12-25 NOTE — Telephone Encounter (Signed)
Contacted pt's POA, Tamara Mejia, and notified her we will be emailing consent for tx along with medication and side effects list. Tamara Mejia verbalized understanding. She states that she spoke with Dr. Peyton Najjar and gave him verbal consent for port. Asked her about scheduling first treatment, but she said we need to talk to caregiver, Tamara Mejia, to set appt up since she will be bringing her to appts. She would like to allow time for port to heal, prior to scheduling chemo. Port placement date pending.   Email: [email or web address]

## 2020-12-25 NOTE — Telephone Encounter (Signed)
Facility called and stated they got medications delivered to them and they were not quite sure what to do with them. They would like a call back to discuss the medications. [phone]

## 2020-12-27 ENCOUNTER — Telehealth: Payer: Self-pay | Admitting: *Deleted

## 2020-12-27 NOTE — Telephone Encounter (Signed)
Attempted PA for the emla cream via covermymeds, but claim stated her medicare plan part d does not cover this med.  I personally contacted [facility] for PA for EMLA cream at [phone]. Spoke with representative and was able to get the medication approved (case # [id number]). 1/1//22-03/27/2021.  I spoke with Lynnda Shields at [facility]. Made Jeani Hawking aware that medication was approved. She thanked me for updating her on the PA process.

## 2020-12-30 ENCOUNTER — Ambulatory Visit: Payer: Self-pay | Admitting: General Surgery

## 2020-12-30 ENCOUNTER — Encounter
Admission: RE | Admit: 2020-12-30 | Discharge: 2020-12-30 | Disposition: A | Discharge status: Home / Self Care | Payer: Medicare Other | Source: Ambulatory Visit | Attending: General Surgery | Admitting: General Surgery

## 2020-12-30 ENCOUNTER — Other Ambulatory Visit: Payer: Medicare Other

## 2020-12-30 NOTE — Patient Instructions (Addendum)
Your procedure is scheduled on:12-31-20 Tuesday Report to the Registration Desk on the 1st floor of the [facility].Then proceed to the 2nd floor Surgery Desk in the [facility] To find out your arrival time, please call [phone] between 1PM - 3PM on:12-30-20 Monday  REMEMBER: Instructions that are not followed completely may result in serious medical risk, up to and including death; or upon the discretion of your surgeon and anesthesiologist your surgery may need to be rescheduled.  Do not eat food after midnight the night before surgery.  No gum chewing, lozengers or hard candies.  You may however, drink Water up to 2 hours before you are scheduled to arrive for your surgery. Do not drink anything within 2 hours of your scheduled arrival time.  Type 1 and Type 2 diabetics should only drink water.  TAKE THESE MEDICATIONS THE MORNING OF SURGERY WITH A SIP OF WATER: -amLODipine (NORVASC) -carbamazepine (TEGRETOL) -simvastatin (ZOCOR)   Last dose of aspirin 81 MG EC tablet was on 12-26-20 Thursday as instructed by Surgeon's office  One week prior to surgery: Stop Anti-inflammatories (NSAIDS) such as Advil, Aleve, Ibuprofen, Motrin, Naproxen, Naprosyn and Aspirin based products such as Excedrin, Goodys Powder, BC Powder. You may however, continue to take Tylenol if needed for pain up until the day of surgery.  No Alcohol for 24 hours before or after surgery.  No Smoking including e-cigarettes for 24 hours prior to surgery.  No chewable tobacco products for at least 6 hours prior to surgery.  No nicotine patches on the day of surgery.  Do not use any "recreational" drugs for at least a week prior to your surgery.  Please be advised that the combination of cocaine and anesthesia may have negative outcomes, up to and including death. If you test positive for cocaine, your surgery will be cancelled.  On the morning of surgery brush your teeth with toothpaste and water, you may rinse  your mouth with mouthwash if you wish. Do not swallow any toothpaste or mouthwash.  Do not wear jewelry, make-up, hairpins, clips or nail polish.  Do not wear lotions, powders, or perfumes.   Do not shave body from the neck down 48 hours prior to surgery just in case you cut yourself which could leave a site for infection.  Also, freshly shaved skin may become irritated if using the CHG soap.  Contact lenses, hearing aids and dentures may not be worn into surgery.  Do not bring valuables to the hospital. [facility] is not responsible for any missing/lost belongings or valuables.   Notify your doctor if there is any change in your medical condition (cold, fever, infection).  Wear comfortable clothing (specific to your surgery type) to the hospital.  After surgery, you can help prevent lung complications by doing breathing exercises.  Take deep breaths and cough every 1-2 hours. Your doctor may order a device called an Incentive Spirometer to help you take deep breaths. When coughing or sneezing, hold a pillow firmly against your incision with both hands. This is called "splinting." Doing this helps protect your incision. It also decreases belly discomfort.  If you are being admitted to the hospital overnight, leave your suitcase in the car. After surgery it may be brought to your room.  If you are being discharged the day of surgery, you will not be allowed to drive home. You will need a responsible adult (18 years or older) to drive you home and stay with you that night.   If  you are taking public transportation, you will need to have a responsible adult (18 years or older) with you. Please confirm with your physician that it is acceptable to use public transportation.   Please call the [facility] Dept. at [phone] if you have any questions about these instructions.  Surgery Visitation Policy:  Patients undergoing a surgery or procedure may have one family member  or support person with them as long as that person is not COVID-19 positive or experiencing its symptoms.  That person may remain in the waiting area during the procedure and may rotate out with other people

## 2020-12-31 ENCOUNTER — Ambulatory Visit: Payer: Medicare Other

## 2020-12-31 ENCOUNTER — Ambulatory Visit: Payer: Self-pay | Admitting: Licensed Clinical Social Worker

## 2020-12-31 ENCOUNTER — Telehealth: Payer: Self-pay | Admitting: Licensed Clinical Social Worker

## 2020-12-31 ENCOUNTER — Ambulatory Visit: Payer: Medicare Other | Admitting: Certified Registered Nurse Anesthetist

## 2020-12-31 ENCOUNTER — Encounter: Payer: Self-pay | Admitting: General Surgery

## 2020-12-31 ENCOUNTER — Encounter
Admission: RE | Disposition: A | Discharge status: Home / Self Care | Payer: Self-pay | Source: Home / Self Care | Attending: General Surgery

## 2020-12-31 ENCOUNTER — Encounter: Payer: Self-pay | Admitting: Licensed Clinical Social Worker

## 2020-12-31 ENCOUNTER — Inpatient Hospital Stay: Payer: Medicare Other

## 2020-12-31 ENCOUNTER — Other Ambulatory Visit: Payer: Self-pay

## 2020-12-31 ENCOUNTER — Ambulatory Visit
Admission: RE | Admit: 2020-12-31 | Discharge: 2020-12-31 | Disposition: A | Discharge status: Home / Self Care | Payer: Medicare Other | Attending: General Surgery | Admitting: General Surgery

## 2020-12-31 DIAGNOSIS — E119 Type 2 diabetes mellitus without complications: Secondary | ICD-10-CM | POA: Insufficient documentation

## 2020-12-31 DIAGNOSIS — Z6833 Body mass index (BMI) 33.0-33.9, adult: Secondary | ICD-10-CM | POA: Insufficient documentation

## 2020-12-31 DIAGNOSIS — Z170 Estrogen receptor positive status [ER+]: Secondary | ICD-10-CM

## 2020-12-31 DIAGNOSIS — Z5111 Encounter for antineoplastic chemotherapy: Secondary | ICD-10-CM | POA: Insufficient documentation

## 2020-12-31 DIAGNOSIS — C50912 Malignant neoplasm of unspecified site of left female breast: Secondary | ICD-10-CM | POA: Insufficient documentation

## 2020-12-31 DIAGNOSIS — Z803 Family history of malignant neoplasm of breast: Secondary | ICD-10-CM

## 2020-12-31 DIAGNOSIS — E669 Obesity, unspecified: Secondary | ICD-10-CM | POA: Diagnosis not present

## 2020-12-31 DIAGNOSIS — Z8616 Personal history of COVID-19: Secondary | ICD-10-CM | POA: Diagnosis not present

## 2020-12-31 DIAGNOSIS — Z1379 Encounter for other screening for genetic and chromosomal anomalies: Secondary | ICD-10-CM | POA: Insufficient documentation

## 2020-12-31 DIAGNOSIS — N3281 Overactive bladder: Secondary | ICD-10-CM | POA: Diagnosis not present

## 2020-12-31 DIAGNOSIS — I10 Essential (primary) hypertension: Secondary | ICD-10-CM | POA: Diagnosis not present

## 2020-12-31 DIAGNOSIS — Z95828 Presence of other vascular implants and grafts: Secondary | ICD-10-CM

## 2020-12-31 DIAGNOSIS — E7800 Pure hypercholesterolemia, unspecified: Secondary | ICD-10-CM | POA: Insufficient documentation

## 2020-12-31 HISTORY — PX: PORTACATH PLACEMENT: SHX2246

## 2020-12-31 LAB — BASIC METABOLIC PANEL
Anion gap: 9 (ref 5–15)
BUN: 11 mg/dL (ref 8–23)
CO2: 26 mmol/L (ref 22–32)
Calcium: 8.9 mg/dL (ref 8.9–10.3)
Chloride: 101 mmol/L (ref 98–111)
Creatinine, Ser: 0.52 mg/dL (ref 0.44–1.00)
GFR, Estimated: >60 mL/min (ref >60)
Glucose, Bld: 128 mg/dL — ABNORMAL HIGH (ref 70–99)
Potassium: 3.6 mmol/L (ref 3.5–5.1)
Sodium: 136 mmol/L (ref 135–145)

## 2020-12-31 LAB — POCT I-STAT, CHEM 8
BUN: 12 mg/dL (ref 8–23)
Calcium, Ion: 0.93 mmol/L — ABNORMAL LOW (ref 1.15–1.40)
Chloride: 104 mmol/L (ref 98–111)
Creatinine, Ser: 0.6 mg/dL (ref 0.44–1.00)
Glucose, Bld: 125 mg/dL — ABNORMAL HIGH (ref 70–99)
HCT: 44 % (ref 36.0–46.0)
Hemoglobin: 15 g/dL (ref 12.0–15.0)
Potassium: 6.2 mmol/L — ABNORMAL HIGH (ref 3.5–5.1)
Sodium: 134 mmol/L — ABNORMAL LOW (ref 135–145)
TCO2: 24 mmol/L (ref 22–32)

## 2020-12-31 LAB — GLUCOSE, CAPILLARY: Glucose-Capillary: 104 mg/dL — ABNORMAL HIGH (ref 70–99)

## 2020-12-31 SURGERY — INSERTION, TUNNELED CENTRAL VENOUS DEVICE, WITH PORT
Anesthesia: General | Site: Chest

## 2020-12-31 MED ORDER — ONDANSETRON HCL 4 MG/2ML IJ SOLN
INTRAMUSCULAR | Status: AC
  Filled 2020-12-31: qty 2

## 2020-12-31 MED ORDER — ORAL CARE MOUTH RINSE: 15.0000 mL | Freq: Once | OROMUCOSAL | Status: AC

## 2020-12-31 MED ORDER — PROPOFOL 500 MG/50ML IV EMUL
INTRAVENOUS | Status: DC | PRN
  Administered 2020-12-31: 75 ug/kg/min via INTRAVENOUS

## 2020-12-31 MED ORDER — HEPARIN SODIUM (PORCINE) 5000 UNIT/ML IJ SOLN
INTRAMUSCULAR | Status: AC
  Filled 2020-12-31: qty 1

## 2020-12-31 MED ORDER — CEFAZOLIN SODIUM-DEXTROSE 2-4 GM/100ML-% IV SOLN
INTRAVENOUS | Status: AC
  Filled 2020-12-31: qty 100

## 2020-12-31 MED ORDER — GLYCOPYRROLATE 0.2 MG/ML IJ SOLN
INTRAMUSCULAR | Status: AC
  Filled 2020-12-31: qty 1

## 2020-12-31 MED ORDER — BUPIVACAINE-EPINEPHRINE (PF) 0.25% -1:200000 IJ SOLN
INTRAMUSCULAR | Status: DC | PRN
  Administered 2020-12-31: 10 mL

## 2020-12-31 MED ORDER — KETAMINE HCL 50 MG/ML IJ SOLN: INTRAMUSCULAR | Status: DC | PRN

## 2020-12-31 MED ORDER — PROPOFOL 500 MG/50ML IV EMUL
INTRAVENOUS | Status: AC
  Filled 2020-12-31: qty 50

## 2020-12-31 MED ORDER — FENTANYL CITRATE (PF) 100 MCG/2ML IJ SOLN
INTRAMUSCULAR | Status: AC
  Filled 2020-12-31: qty 2

## 2020-12-31 MED ORDER — SODIUM CHLORIDE 0.9 % IV SOLN: INTRAVENOUS | Status: DC

## 2020-12-31 MED ORDER — FENTANYL CITRATE (PF) 100 MCG/2ML IJ SOLN: 25.0000 ug | INTRAMUSCULAR | Status: DC | PRN

## 2020-12-31 MED ORDER — PHENYLEPHRINE HCL (PRESSORS) 10 MG/ML IV SOLN
INTRAVENOUS | Status: DC | PRN
  Administered 2020-12-31: 80 ug via INTRAVENOUS
  Administered 2020-12-31: 160 ug via INTRAVENOUS
  Administered 2020-12-31: 80 ug via INTRAVENOUS

## 2020-12-31 MED ORDER — FENTANYL CITRATE (PF) 100 MCG/2ML IJ SOLN
INTRAMUSCULAR | Status: DC | PRN
  Administered 2020-12-31: 12.5 ug via INTRAVENOUS
  Administered 2020-12-31 (×2): 25 ug via INTRAVENOUS
  Administered 2020-12-31: 12.5 ug via INTRAVENOUS
  Administered 2020-12-31: 25 ug via INTRAVENOUS

## 2020-12-31 MED ORDER — SODIUM CHLORIDE (PF) 0.9 % IJ SOLN
INTRAMUSCULAR | Status: AC
  Filled 2020-12-31: qty 50

## 2020-12-31 MED ORDER — PROPOFOL 10 MG/ML IV BOLUS
INTRAVENOUS | Status: DC | PRN
  Administered 2020-12-31: 20 mg via INTRAVENOUS
  Administered 2020-12-31: 30 mg via INTRAVENOUS

## 2020-12-31 MED ORDER — CEFAZOLIN SODIUM-DEXTROSE 2-4 GM/100ML-% IV SOLN
2.0000 g | INTRAVENOUS | Status: AC
  Administered 2020-12-31: 2 g via INTRAVENOUS

## 2020-12-31 MED ORDER — SODIUM CHLORIDE 0.9 % IV SOLN
INTRAVENOUS | Status: DC | PRN
  Administered 2020-12-31: 10 mL via INTRAMUSCULAR

## 2020-12-31 MED ORDER — BUPIVACAINE-EPINEPHRINE (PF) 0.25% -1:200000 IJ SOLN
INTRAMUSCULAR | Status: AC
  Filled 2020-12-31: qty 30

## 2020-12-31 MED ORDER — FAMOTIDINE 20 MG PO TABS: 20.0000 mg | ORAL_TABLET | Freq: Once | ORAL | Status: AC

## 2020-12-31 MED ORDER — FAMOTIDINE 20 MG PO TABS
ORAL_TABLET | ORAL | Status: AC
  Administered 2020-12-31: 20 mg via ORAL
  Filled 2020-12-31: qty 1

## 2020-12-31 MED ORDER — CHLORHEXIDINE GLUCONATE 0.12 % MT SOLN: 15.0000 mL | Freq: Once | OROMUCOSAL | Status: AC

## 2020-12-31 MED ORDER — CHLORHEXIDINE GLUCONATE 0.12 % MT SOLN
OROMUCOSAL | Status: AC
  Administered 2020-12-31: 15 mL via OROMUCOSAL
  Filled 2020-12-31: qty 15

## 2020-12-31 MED ORDER — MIDAZOLAM HCL 2 MG/2ML IJ SOLN
INTRAMUSCULAR | Status: AC
  Filled 2020-12-31: qty 2

## 2020-12-31 SURGICAL SUPPLY — 35 items
BAG DECANTER FOR FLEXI CONT (MISCELLANEOUS) ×3 IMPLANT
BLADE SURG 11 STRL SS SAFETY (MISCELLANEOUS) ×3 IMPLANT
BLADE SURG SZ11 CARB STEEL (BLADE) ×3 IMPLANT
BOOT SUTURE AID YELLOW STND (SUTURE) ×3 IMPLANT
CHLORAPREP W/TINT 26 (MISCELLANEOUS) ×3 IMPLANT
COVER LIGHT HANDLE STERIS (MISCELLANEOUS) ×6 IMPLANT
DERMABOND ADVANCED (GAUZE/BANDAGES/DRESSINGS) ×2
DERMABOND ADVANCED .7 DNX12 (GAUZE/BANDAGES/DRESSINGS) ×1 IMPLANT
DRAPE C-ARM XRAY 36X54 (DRAPES) ×3 IMPLANT
ELECT REM PT RETURN 9FT ADLT (ELECTROSURGICAL) ×3
ELECTRODE REM PT RTRN 9FT ADLT (ELECTROSURGICAL) ×1 IMPLANT
GAUZE 4X4 16PLY ~~LOC~~+RFID DBL (SPONGE) ×3 IMPLANT
GLOVE SURG ENC MOIS LTX SZ6.5 (GLOVE) ×3 IMPLANT
GLOVE SURG UNDER POLY LF SZ6.5 (GLOVE) ×3 IMPLANT
GOWN STRL REUS W/ TWL LRG LVL3 (GOWN DISPOSABLE) ×3 IMPLANT
GOWN STRL REUS W/TWL LRG LVL3 (GOWN DISPOSABLE) ×6
IV NS 500ML (IV SOLUTION) ×2
IV NS 500ML BAXH (IV SOLUTION) ×1 IMPLANT
KIT PORT POWER 8FR ISP CVUE (Port) ×3 IMPLANT
KIT TURNOVER KIT A (KITS) ×3 IMPLANT
LABEL OR SOLS (LABEL) ×3 IMPLANT
MANIFOLD NEPTUNE II (INSTRUMENTS) ×3 IMPLANT
NDL FILTER BLUNT 18X1 1/2 (NEEDLE) ×1 IMPLANT
NEEDLE FILTER BLUNT 18X 1/2SAF (NEEDLE) ×2
NEEDLE FILTER BLUNT 18X1 1/2 (NEEDLE) ×1 IMPLANT
PACK PORT-A-CATH (MISCELLANEOUS) ×3 IMPLANT
SUT MNCRL AB 4-0 PS2 18 (SUTURE) ×3 IMPLANT
SUT PROLENE 2 0 FS (SUTURE) ×3 IMPLANT
SUT VIC AB 2-0 SH 27 (SUTURE) ×2
SUT VIC AB 2-0 SH 27XBRD (SUTURE) ×1 IMPLANT
SUT VIC AB 3-0 SH 27 (SUTURE) ×2
SUT VIC AB 3-0 SH 27X BRD (SUTURE) ×1 IMPLANT
SYR 10ML LL (SYRINGE) ×6 IMPLANT
SYR 3ML LL SCALE MARK (SYRINGE) ×3 IMPLANT
WATER STERILE IRR 500ML POUR (IV SOLUTION) ×3 IMPLANT

## 2020-12-31 NOTE — Telephone Encounter (Signed)
Consent for chemo treatment and medications with side effect list, emailed to [employer].   Please contact Tasha to schedule Lab/MD/ docetaxel/ cytoxan w/onpro *new*, per caregiver availability.

## 2020-12-31 NOTE — Progress Notes (Signed)
HPI:  Ms. Ivie was previously seen in the [place] clinic due to a personal and family history of cancer and concerns regarding a hereditary predisposition to cancer. Please refer to our prior cancer genetics clinic note for more information regarding our discussion, assessment and recommendations, at the time. Ms. Eastwood recent genetic test results were disclosed to her, as were recommendations warranted by these results. These results and recommendations are discussed in more detail below.  CANCER HISTORY:  Oncology History  Breast cancer ([place])  10/28/2020 Initial Diagnosis   Breast cancer ([place])   12/23/2020 Cancer Staging   Staging form: Breast, AJCC 8th Edition - Pathologic stage from 12/23/2020: Stage IB (pT1c, pN1a, cM0, G3, ER+, PR+, HER2-) - Signed by Earlie Server, MD on 12/23/2020 Stage prefix: Initial diagnosis Multigene prognostic tests performed: MammaPrint Histologic grading system: 3 grade system    12/30/2020 -  Chemotherapy   Patient is on Treatment Plan : BREAST TC q21d      Genetic Testing   Negative genetic testing. No pathogenic variants identified on the [facility] Common Hereditary Cancers+RNA panel. The report date is 12/28/2020.  The Common Hereditary Cancers Panel + RNA offered by [facility] includes sequencing and/or deletion duplication testing of the following 47 genes: APC, ATM, AXIN2, BARD1, BMPR1A, BRCA1, BRCA2, BRIP1, CDH1, CDKN2A (p14ARF), CDKN2A (p16INK4a), CKD4, CHEK2, CTNNA1, DICER1, EPCAM (Deletion/duplication testing only), GREM1 (promoter region deletion/duplication testing only), KIT, MEN1, MLH1, MSH2, MSH3, MSH6, MUTYH, NBN, NF1, NHTL1, PALB2, PDGFRA, PMS2, POLD1, POLE, PTEN, RAD50, RAD51C, RAD51D, SDHB, SDHC, SDHD, SMAD4, SMARCA4. STK11, TP53, TSC1, TSC2, and VHL.  The following genes were evaluated for sequence changes only: SDHA and HOXB13 c.251G>A variant only.     FAMILY HISTORY:  We obtained a detailed, 4-generation family history.   Significant diagnoses are listed below: Family History  Problem Relation Age of Onset   Diabetes Father    Heart disease Father    Heart disease Brother    Breast cancer Cousin        dx 79s     Ms. Lundeen had 1 full brother, he passed in his 56s of a heart attack. She has a paternal half sister, 60, no history of cancer.   Ms. Litaker biological mother died in her 60s-50s, she did not have siblings. Maternal grandmother died in her 21s of Alzheimer's, and her sister's daughter, [name], had breast cancer recently diagnosed in her 37s. No information about maternal grandfather.    Ms. Bevilacqua father died in his 1s. Patient had 1 paternal aunt, no cancer. No cancer in cousins. Paternal grandparents passed in their 77s with no history of cancer.   Ms. Caraveo is unaware of previous family history of genetic testing for hereditary cancer risks. Patient's maternal ancestors are of unknown descent, and paternal ancestors are of French/Dutch/unknown descent. There is no reported Ashkenazi Jewish ancestry. There is no known consanguinity.      GENETIC TEST RESULTS: Genetic testing reported out on 12/28/2020 through the [facility] Common Hereditary Cancers+RNA cancer panel found no pathogenic mutations.   The Common Hereditary Cancers Panel + RNA offered by [facility] includes sequencing and/or deletion duplication testing of the following 47 genes: APC, ATM, AXIN2, BARD1, BMPR1A, BRCA1, BRCA2, BRIP1, CDH1, CDKN2A (p14ARF), CDKN2A (p16INK4a), CKD4, CHEK2, CTNNA1, DICER1, EPCAM (Deletion/duplication testing only), GREM1 (promoter region deletion/duplication testing only), KIT, MEN1, MLH1, MSH2, MSH3, MSH6, MUTYH, NBN, NF1, NHTL1, PALB2, PDGFRA, PMS2, POLD1, POLE, PTEN, RAD50, RAD51C, RAD51D, SDHB, SDHC, SDHD, SMAD4, SMARCA4. STK11, TP53, TSC1, TSC2, and  VHL.  The following genes were evaluated for sequence changes only: SDHA and HOXB13 c.251G>A variant only.  The test report has been scanned into EPIC and is  located under the Molecular Pathology section of the Results Review tab.  A portion of the result report is included below for reference.     We discussed that because current genetic testing is not perfect, it is possible there may be a gene mutation in one of these genes that current testing cannot detect, but that chance is small.  There could be another gene that has not yet been discovered, or that we have not yet tested, that is responsible for the cancer diagnoses in the family. It is also possible there is a hereditary cause for the cancer in the family that Ms. Lazar did not inherit and therefore was not identified in her testing.  Therefore, it is important to remain in touch with cancer genetics in the future so that we can continue to offer Ms. Lynn the most up to date genetic testing.   ADDITIONAL GENETIC TESTING: We discussed with Ms. Isa that her genetic testing was fairly extensive.  If there are genes identified to increase cancer risk that can be analyzed in the future, we would be happy to discuss and coordinate this testing at that time.    CANCER SCREENING RECOMMENDATIONS: Ms. Steffenhagen test result is considered negative (normal).  This means that we have not identified a hereditary cause for her  personal and family history of cancer at this time. Most cancers happen by chance and this negative test suggests that her cancer may fall into this category.    While reassuring, this does not definitively rule out a hereditary predisposition to cancer. It is still possible that there could be genetic mutations that are undetectable by current technology. There could be genetic mutations in genes that have not been tested or identified to increase cancer risk.  Therefore, it is recommended she continue to follow the cancer management and screening guidelines provided by her oncology and primary healthcare provider.   An individual's cancer risk and medical management are not determined by  genetic test results alone. Overall cancer risk assessment incorporates additional factors, including personal medical history, family history, and any available genetic information that may result in a personalized plan for cancer prevention and surveillance.  RECOMMENDATIONS FOR FAMILY MEMBERS:  Relatives in this family might be at some increased risk of developing cancer, over the general population risk, simply due to the family history of cancer.  We recommended female relatives in this family have a yearly mammogram beginning at age 41, or 14 years younger than the earliest onset of cancer, an annual clinical breast exam, and perform monthly breast self-exams. Female relatives in this family should also have a gynecological exam as recommended by their primary provider.  All family members should be referred for colonoscopy starting at age 70.   FOLLOW-UP: Lastly, we discussed with Ms. Pleitez that cancer genetics is a rapidly advancing field and it is possible that new genetic tests will be appropriate for her and/or her family members in the future. We encouraged her to remain in contact with cancer genetics on an annual basis so we can update her personal and family histories and let her know of advances in cancer genetics that may benefit this family.   Our contact number was provided. Ms. Preisler questions were answered to her satisfaction, and she knows she is welcome to call us at anytime  with additional questions or concerns.   Faith Rogue, MS, [facility] Genetic Counselor [email or web address] Phone: [phone]

## 2020-12-31 NOTE — Telephone Encounter (Signed)
Spoke with patient's guardian Tamara Mejia - Revealed negative genetic testing.  This normal result is reassuring and indicates that it is unlikely Tamara Mejia's cancer is due to a hereditary cause.  It is unlikely that there is an increased risk of another cancer due to a mutation in one of these genes.  However, genetic testing is not perfect, and cannot definitively rule out a hereditary cause.  It will be important for her to keep in contact with genetics to learn if any additional testing may be needed in the future.

## 2020-12-31 NOTE — Anesthesia Preprocedure Evaluation (Signed)
Anesthesia Evaluation  Patient identified by MRN, date of birth, ID band Patient awake    Reviewed: Allergy & Precautions, NPO status , Patient's Chart, lab work & pertinent test results  History of Anesthesia Complications Negative for: history of anesthetic complications  Airway Mallampati: II  TM Distance: <3 FB Neck ROM: Full    Dental  (+) Poor Dentition, Implants, Dental Advidsory Given   Pulmonary neg shortness of breath, neg sleep apnea, neg COPD, neg recent URI,    breath sounds clear to auscultation- rhonchi (-) wheezing      Cardiovascular Exercise Tolerance: Good hypertension, Pt. 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 Home test (+). No date: Hypertension No date: Meningitis     Comment:  in childhood No date: Obesity (BMI 30-39.9) No date: Overactive bladder No date: Seizure [facility])     Comment:  last seizure 2000   Reproductive/Obstetrics                             Anesthesia Physical  Anesthesia Plan  ASA: 2  Anesthesia Plan: General   Post-op Pain  Management:    Induction: Intravenous  PONV Risk Score and Plan: 2 and Propofol infusion and TIVA  Airway Management Planned: Natural Airway, Simple Face Mask and Nasal Cannula  Additional Equipment:   Intra-op Plan:   Post-operative Plan:   Informed Consent: I have reviewed the patients History and Physical, chart, labs and discussed the procedure including the risks, benefits and alternatives for the proposed anesthesia with the patient or authorized representative who has indicated his/her understanding and acceptance.     Dental advisory given  Plan Discussed with: CRNA and Anesthesiologist  Anesthesia Plan Comments:         Anesthesia Quick Evaluation

## 2020-12-31 NOTE — Discharge Instructions (Addendum)
°  Diet: Resume home heart healthy regular diet.   Activity: Increase activity as tolerated. Light activity and walking are encouraged. Do not drive or drink alcohol if taking narcotic pain medications.  Wound care: May shower with soapy water and pat dry (do not rub incisions), but no baths or submerging incision underwater until follow-up. (no swimming)   Medications: Resume all home medications. For mild to moderate pain: acetaminophen (Tylenol) or ibuprofen (if no kidney disease). Combining Tylenol with alcohol can substantially increase your risk of causing liver disease.   Call office ([phone]) at any time if any questions, worsening pain, fevers/chills, bleeding, drainage from incision site, or other concerns. AMBULATORY SURGERY  DISCHARGE INSTRUCTIONS   The drugs that you were given will stay in your system until tomorrow so for the next 24 hours you should not:  Drive an automobile Make any legal decisions Drink any alcoholic beverage   You may resume regular meals tomorrow.  Today it is better to start with liquids and gradually work up to solid foods.  You may eat anything you prefer, but it is better to start with liquids, then soup and crackers, and gradually work up to solid foods.   Please notify your doctor immediately if you have any unusual bleeding, trouble breathing, redness and pain at the surgery site, drainage, fever, or pain not relieved by medication.    Additional Instructions:        Please contact your physician with any problems or Same Day Surgery at [phone], Monday through Friday 6 am to 4 pm, or [place] at [facility] Main number at [phone].  

## 2020-12-31 NOTE — Transfer of Care (Signed)
Immediate Anesthesia Transfer of Care Note  Patient: Tamara Mejia  Procedure(s) Performed: INSERTION PORT-A-CATH (Chest)  Patient Location: PACU  Anesthesia Type:General  Level of Consciousness: drowsy and patient cooperative  Airway & Oxygen Therapy: Patient Spontanous Breathing  Post-op Assessment: Report given to RN and Post -op Vital signs reviewed and stable  Post vital signs: Reviewed and stable  Last Vitals:  Vitals Value Taken Time  BP 115/56 12/31/20 1506  Temp    Pulse 80 12/31/20 1508  Resp 15 12/31/20 1508  SpO2 100 % 12/31/20 1508  Vitals shown include unvalidated device data.  Last Pain:  Vitals:   12/31/20 1219  TempSrc: Oral  PainSc: 0-No pain         Complications: No notable events documented.

## 2020-12-31 NOTE — Op Note (Signed)
SURGICAL PROCEDURE REPORT  DATE OF PROCEDURE: 12/31/2020   SURGEON: Dr. Windell Moment   ANESTHESIA: Local with light IV sedation   PRE-OPERATIVE DIAGNOSIS: Advanced breast cancer requiring durable central venous access for chemotherapy   POST-OPERATIVE DIAGNOSIS: Same  PROCEDURE(S):  1.) Percutaneous access of Right internal jugular vein under ultrasound guidance 2.) Insertion of tunneled Right internal jugular central venous catheter with subcutaneous port  INTRAOPERATIVE FINDINGS: Patent easily compressible Right internal jugular vein with appropriate respiratory variations and well-secured tunneled central venous catheter with subcutaneous port at completion of the procedure  ESTIMATED BLOOD LOSS: Minimal (<20 mL)   SPECIMENS: None   IMPLANTS: 74F tunneled Bard PowerPort central venous catheter with subcutaneous port  DRAINS: None   COMPLICATIONS: None apparent   CONDITION AT COMPLETION: Hemodynamically stable, awake   DISPOSITION: PACU   INDICATION(S) FOR PROCEDURE:  Patient is a 70 y.o. female who presented with advanced  cancer requiring durable central venous access for chemotherapy. All risks, benefits, and alternatives to above elective procedures were discussed with the patient, who elected to proceed, and informed consent was accordingly obtained at that time.  DETAILS OF PROCEDURE:  Patient was brought to the operative suite and appropriately identified. In Trendelenburg position, Right IJ venous access site was prepped and draped in the usual sterile fashion, and following a brief timeout, percutaneous Right IJ venous access was obtained under ultrasound guidance using Seldinger technique, by which local anesthetic was injected over the Right IJ vein, and access needle was inserted under direct ultrasound visualization into the Right IJ vein, through which soft guidewire was advanced, over which access needle was withdrawn. Guidewire was secured, attention was  directed to injection of local anesthetic along the planned tunnel site, 2-3 cm transverse Right chest incision was made and confirmed to accommodate the subcutaneous port, and flushed catheter was tunneled retrograde from the port site over the Right chest to the Right IJ access site with the attached port well-secured to the catheter and within the subcutaneous pocket. Insertion sheath was advanced over the guidewire, which was withdrawn along with the insertion sheath dilator. The catheter was introduced through the sheath and left on the [place] junction under fluoro guidance and catheter cut to desire lenght. Catheter connected to port and fixed to the pocket on two side to avoid twisting. Port was confirmed to withdraw blood and flush easily, after which concentrated heparin was instilled into the port and catheter. Dermis at the subcutaneous pocket was re-approximated using buried interrupted 3-0 Vicryl suture, and 4-0 Monocryl suture was used to re-approximate skin at the insertion and subcutaneous port sites in running subcuticular fashion for the subcutaneous port and buried interrupted fashion for the insertion site. Skin was cleaned, dried, and sterile skin glue was applied. Patient was then safely transferred to PACU for a chest x-ray. Ultrasound images are available on paper chart and Fluoroscopy guidance images are available in Epic.

## 2020-12-31 NOTE — H&P (Signed)
History of Present Illness Tamara Mejia is a 70 y.o. female with left breast cancer.  Comes to the OR today for port placement. Patient previously evaluated in the office. No change on history or physical exam since last evaluation.   Medical oncology recommended insertion or port a cath for chemotherapy for breast cancer.   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 Home test (+).   Hypertension    Meningitis    in childhood   Obesity (BMI 30-39.9)    Overactive bladder    Seizure ([place])    last seizure 2000       Past Surgical History:  Procedure Laterality Date   ABDOMINAL HYSTERECTOMY     as a teenager   BREAST BIOPSY Right    neg   BREAST BIOPSY Left 10/18/2020   u/s breast  biopsy, "venus" clip-INVASIVE MAMMARY CARCINOMA   CHOLECYSTECTOMY     COLONOSCOPY WITH PROPOFOL N/A 08/27/2015   Procedure: COLONOSCOPY WITH PROPOFOL;  Surgeon: Lucilla Lame, MD;  Location: [facility] ENDOSCOPY;  Service: Endoscopy;  Laterality: N/A;   PART MASTECTOMY,RADIO FREQUENCY LOCALIZER,AXILLARY SENTINEL NODE BIOPSY Left 11/22/2020   Procedure: PART MASTECTOMY, RADIO FREQUENCY LOCALIZER,AXILLARY SENTINEL NODE BIOPSY;  Surgeon: Herbert Pun, MD;  Location: [facility] ORS;  Service: General;  Laterality: Left;    No Known Allergies  Current Facility-Administered Medications  Medication Dose Route Frequency Provider Last Rate Last Admin   0.9 %  sodium chloride infusion   Intravenous Continuous Piscitello, Precious Haws, MD 50 mL/hr at 12/31/20 1222 New Bag at 12/31/20 1222   ceFAZolin (ANCEF) 2-4 GM/100ML-% IVPB            ceFAZolin (ANCEF) IVPB 2g/100 mL  premix  2 g Intravenous On Call to OR Herbert Pun, MD        Family History Family History  Problem Relation Age of Onset   Diabetes Father    Heart disease Father    Heart disease Brother    Breast cancer Cousin        dx 49s       Social History Social History   Tobacco Use   Smoking status: Never   Smokeless tobacco: Never  Substance Use Topics   Alcohol use: No   Drug use: No        ROS Full ROS of systems performed and is otherwise negative there than what is stated in the HPI  Physical Exam Blood pressure (!) 157/83, temperature 98.5 F (36.9 C), temperature source Oral, resp. rate 16, height 5' 3.5" (1.613 m), weight 88.2 kg, SpO2 99 %.  CONSTITUTIONAL: alert, oriented x 3 EYES: Pupils equal, round, and reactive to light, Sclera non-icteric. EARS, NOSE, MOUTH AND THROAT: The oropharynx is clear. Oral mucosa is pink and moist. Hearing is intact to voice.  NECK: Trachea is midline, and there is no jugular venous distension. Thyroid is without palpable abnormalities. LYMPH NODES:  Lymph nodes in the neck are not enlarged. RESPIRATORY:  Lungs are clear, and breath sounds are equal bilaterally. Normal respiratory effort without pathologic use of accessory muscles. CARDIOVASCULAR: Heart is regular without murmurs, gallops,  or rubs. GI: The abdomen is soft, nontender, and nondistended. There were no palpable masses. There was no hepatosplenomegaly. There were normal bowel sounds. GU: deferred MUSCULOSKELETAL:  Normal muscle strength and tone in all four extremities.    SKIN: Skin turgor is normal. There are no pathologic skin lesions.  NEUROLOGIC:  Motor and sensation is grossly normal.  Cranial nerves are grossly intact. PSYCH:  Alert and oriented to person, place and time. Affect is normal.  Data Reviewed Pathology reviewed  I have personally reviewed the patient's imaging and medical records.    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

## 2021-01-01 ENCOUNTER — Encounter: Payer: Self-pay | Admitting: General Surgery

## 2021-01-01 ENCOUNTER — Encounter: Payer: Self-pay | Admitting: Oncology

## 2021-01-01 ENCOUNTER — Ambulatory Visit
Admission: RE | Admit: 2021-01-01 | Discharge: 2021-01-01 | Disposition: A | Discharge status: Home / Self Care | Payer: Medicare Other | Source: Ambulatory Visit | Attending: Oncology | Admitting: Oncology

## 2021-01-01 DIAGNOSIS — E119 Type 2 diabetes mellitus without complications: Secondary | ICD-10-CM | POA: Insufficient documentation

## 2021-01-01 DIAGNOSIS — I10 Essential (primary) hypertension: Secondary | ICD-10-CM | POA: Insufficient documentation

## 2021-01-01 DIAGNOSIS — Z0189 Encounter for other specified special examinations: Secondary | ICD-10-CM | POA: Insufficient documentation

## 2021-01-01 DIAGNOSIS — Z79899 Other long term (current) drug therapy: Secondary | ICD-10-CM | POA: Insufficient documentation

## 2021-01-01 DIAGNOSIS — C50912 Malignant neoplasm of unspecified site of left female breast: Secondary | ICD-10-CM | POA: Diagnosis not present

## 2021-01-01 DIAGNOSIS — Z5181 Encounter for therapeutic drug level monitoring: Secondary | ICD-10-CM

## 2021-01-01 LAB — ECHOCARDIOGRAM COMPLETE: S' Lateral: 2.9 cm

## 2021-01-01 NOTE — Anesthesia Postprocedure Evaluation (Signed)
Anesthesia Post Note  Patient: Tamara Mejia  Procedure(s) Performed: INSERTION PORT-A-CATH (Chest)  Patient location during evaluation: PACU Anesthesia Type: General Level of consciousness: awake and alert Pain management: pain level controlled Vital Signs Assessment: post-procedure vital signs reviewed and stable Respiratory status: spontaneous breathing, nonlabored ventilation, respiratory function stable and patient connected to nasal cannula oxygen Cardiovascular status: blood pressure returned to baseline and stable Postop Assessment: no apparent nausea or vomiting Anesthetic complications: no   No notable events documented.   Last Vitals:  Vitals:   12/31/20 1537 12/31/20 1556  BP:  (!) 155/70  Pulse: 78 71  Resp: 12 18  Temp: (!) 36.1 C (!) 36.1 C  SpO2: 99% 96%    Last Pain:  Vitals:   12/31/20 1556  TempSrc: Temporal  PainSc: 0-No pain                 Martha Clan

## 2021-01-01 NOTE — Progress Notes (Signed)
Pharmacist Chemotherapy Monitoring - Initial Assessment    Anticipated start date: 01/08/21   The following has been reviewed per standard work regarding the patient's treatment regimen: The patient's diagnosis, treatment plan and drug doses, and organ/hematologic function Lab orders and baseline tests specific to treatment regimen  The treatment plan start date, drug sequencing, and pre-medications Prior authorization status  Patient's documented medication list, including drug-drug interaction screen and prescriptions for anti-emetics and supportive care specific to the treatment regimen The drug concentrations, fluid compatibility, administration routes, and timing of the medications to be used The patient's access for treatment and lifetime cumulative dose history, if applicable  The patient's medication allergies and previous infusion related reactions, if applicable   Changes made to treatment plan:  treatment plan date  Follow up needed:  signing treatment plan   Adelina Mings, [place], 01/01/2021  1:06 PM

## 2021-01-01 NOTE — Telephone Encounter (Signed)
Received signed chemo consent, sent to scan in chart. Pt scheduled and Winferd Humphrey will notify caregiver of appts.

## 2021-01-01 NOTE — Progress Notes (Signed)
*  PRELIMINARY RESULTS* Echocardiogram 2D Echocardiogram has been performed.  Tamara Mejia 01/01/2021, 11:20 AM

## 2021-01-02 ENCOUNTER — Encounter: Payer: Self-pay | Admitting: Adult Health

## 2021-01-02 ENCOUNTER — Ambulatory Visit (INDEPENDENT_AMBULATORY_CARE_PROVIDER_SITE_OTHER): Payer: Medicare Other | Admitting: Adult Health

## 2021-01-02 VITALS — BP 125/79 | HR 83 | Ht 63.0 in | Wt 195.0 lb

## 2021-01-02 DIAGNOSIS — G40309 Generalized idiopathic epilepsy and epileptic syndromes, not intractable, without status epilepticus: Secondary | ICD-10-CM | POA: Diagnosis not present

## 2021-01-02 DIAGNOSIS — Z5181 Encounter for therapeutic drug level monitoring: Secondary | ICD-10-CM | POA: Diagnosis not present

## 2021-01-02 NOTE — Progress Notes (Signed)
PATIENT: Tamara Mejia DOB: 08-27-1950  REASON FOR VISIT: follow up HISTORY FROM: patient  HISTORY OF PRESENT ILLNESS: Today 01/02/21:  Tamara Mejia is a 70 year old female with a history of mental retardation and seizures.  She returns today for follow-up.  She continues to live at a group home.  She is here today with a caregiver.  Denies any seizure events.  Continues on carbamazepine 200 mg 3 times a day.  Recently diagnosed with breast cancer and will start chemotherapy next week.  12/28/19: Tamara Mejia is a 70 year old female with a history of mental retardation and seizures.  She returns today for follow-up.  Remains on carbamazepine 200 mg 3 times daily.  Continues to reside in a group home.  She is here today with a caregiver.  Denies any seizure events.  No change in her gait or balance.  No change in mood or behavior.  She returns today for an evaluation.  12/12/18: Tamara Mejia is a 70 year old female with a history of mental retardation and seizures.  She returns today for follow-up.  Overall she is doing well.  She denies any seizure events.  She continues on carbamazepine 200 mg 3 times a day.  She continues to live at a group home.  She is able to complete all ADLs independently.  She does not cook there.  She denies any changes with her gait or balance.  She returns today for an evaluation.  HISTORY 12/06/2017:  Tamara Mejia 70 year old female returns for follow-up with history of generalized seizure disorder.  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  Last seizure was in 2000.  She currently resides in a group home.  She goes to a day program.  She is independent with activities of daily living.  She is currently on Tegretol 203 times daily without side effects.  No falls no balance issues.  CBC and CMP reviewed in care everywhere.  She returns for reevaluation  REVIEW OF SYSTEMS: Out of a complete 14 system review of symptoms, the patient  complains only of the following symptoms, and all other reviewed systems are negative.  See HPI  ALLERGIES: No Known Allergies  HOME MEDICATIONS: Outpatient Medications Prior to Visit  Medication Sig Dispense Refill   acetaminophen (TYLENOL) 500 MG tablet Take 1,000 mg by mouth every 8 (eight) hours as needed (pain.).     amLODipine (NORVASC) 5 MG tablet Take 5 mg by mouth in the morning.     aspirin 81 MG EC tablet Take 81 mg by mouth in the morning. Swallow whole.     carbamazepine (TEGRETOL) 200 MG tablet TAKE ONE TABLET BY MOUTH THREE TIMES DAILY. (SEIZURE CONTROL) 90 tablet 11   Cholecalciferol (VITAMIN D) 50 MCG (2000 UT) tablet Take 4,000 Units by mouth in the morning.     dexamethasone (DECADRON) 4 MG tablet Take 2 tablets (8 mg total) by mouth 2 (two) times daily. Start the day before Taxotere. Then again the day after chemo for 3 days. 30 tablet 1   ibuprofen (ADVIL) 200 MG tablet Take 400 mg by mouth every 8 (eight) hours as needed (pain.).     lidocaine-prilocaine (EMLA) cream Apply to affected area once 30 g 3   lisinopril-hydrochlorothiazide (ZESTORETIC) 20-12.5 MG tablet Take 1 tablet by mouth every morning.     ondansetron (ZOFRAN) 8 MG tablet Take 1 tablet (8 mg total) by mouth 2 (two) times daily as needed for refractory nausea / vomiting. Start  on day 3 after chemo. 30 tablet 1   prochlorperazine (COMPAZINE) 10 MG tablet Take 1 tablet (10 mg total) by mouth every 6 (six) hours as needed (Nausea or vomiting). 30 tablet 1   simvastatin (ZOCOR) 20 MG tablet Take 20 mg by mouth in the morning.     No facility-administered medications prior to visit.    PAST MEDICAL HISTORY: Past Medical History:  Diagnosis Date   Breast cancer ([place])    Cognitive dysfunction    mental retardation (mild to moderate)   Diabetes mellitus, type 2 (HCC)    diet controlled   Family history of breast cancer    High cholesterol    History of 2019 novel coronavirus disease (COVID-19)  11/04/2020   a.) resides in group home; others were sick with SARS-Cov-2. Home test (+).   Hypertension    Meningitis    in childhood   Obesity (BMI 30-39.9)    Overactive bladder    Seizure ([place])    last seizure 2000    PAST SURGICAL HISTORY: Past Surgical History:  Procedure Laterality Date   ABDOMINAL HYSTERECTOMY     as a teenager   BREAST BIOPSY Right    neg   BREAST BIOPSY Left 10/18/2020   u/s breast  biopsy, "venus" clip-INVASIVE MAMMARY CARCINOMA   CHOLECYSTECTOMY     COLONOSCOPY WITH PROPOFOL N/A 08/27/2015   Procedure: COLONOSCOPY WITH PROPOFOL;  Surgeon: Lucilla Lame, MD;  Location: [facility] END[facility];  Service: Endoscopy;  Laterality: N/A;   PART MASTECTOMY,RADIO FREQUENCY LOCALIZER,AXILLARY SENTINEL NODE BIOPSY Left 11/22/2020   Procedure: PART MASTECTOMY, RADIO FREQUENCY LOCALIZER,AXILLARY SENTINEL NODE BIOPSY;  Surgeon: Herbert Pun, MD;  Location: [facility] ORS;  Service: General;  Laterality: Left;   PORTACATH PLACEMENT N/A 12/31/2020   Procedure: INSERTION PORT-A-CATH;  Surgeon: Herbert Pun, MD;  Location: [facility] ORS;  Service: General;  Laterality: N/A;    FAMILY HISTORY: Family History  Problem Relation Age of Onset   Diabetes Father    Heart disease Father    Heart disease Brother    Breast cancer Cousin        dx 20s    SOCIAL HISTORY: Social History   Socioeconomic History   Marital status: Single    Spouse name: Not on file   Number of children: Not on file   Years of education: Not on file   Highest education level: Not on file  Occupational History   Not on file  Tobacco Use   Smoking status: Never   Smokeless tobacco: Never  Substance and Sexual Activity   Alcohol use: No   Drug use: No   Sexual activity: Not on file  Other Topics Concern   Not on file  Social History Narrative   Patient works for cutting [employer]   Patient has her adult education certificate   Patient drinks about 2 cups of coffee daily.          Social  Determinants of Health   Financial Resource Strain: Not on file  Food Insecurity: Not on file  Transportation Needs: Not on file  Physical Activity: Not on file  Stress: Not on file  Social Connections: Not on file  Intimate Partner Violence: Not on file      PHYSICAL EXAM  Vitals:   01/02/21 0932  BP: 125/79  Pulse: 83  Weight: 195 lb (88.5 kg)  Height: 5\' 3"  (1.6 m)   Body mass index is 34.54 kg/m.  Generalized: Well developed, in no acute distress   Neurological  examination  Mentation: Alert oriented to time, place, history taking. Follows all commands speech and language fluent Cranial nerve II-XII: Pupils were equal round reactive to light. Extraocular movements were full, visual field were full on confrontational test.  Head turning and shoulder shrug  were normal and symmetric. Motor: The motor testing reveals 5 over 5 strength of all 4 extremities. Good symmetric motor tone is noted throughout.  Sensory: Sensory testing is intact to soft touch on all 4 extremities. No evidence of extinction is noted.  Coordination: Cerebellar testing reveals good finger-nose-finger and heel-to-shin bilaterally.  Gait and station: Gait is normal.    DIAGNOSTIC DATA (LABS, IMAGING, TESTING) - I reviewed patient records, labs, notes, testing and imaging myself where available.  Lab Results  Component Value Date   WBC 4.7 10/28/2020   HGB 15.0 12/31/2020   HCT 44.0 12/31/2020   MCV 91.9 10/28/2020   PLT 235 10/28/2020      Component Value Date/Time   NA 136 12/31/2020 1224   NA 140 12/28/2019 1000   NA 139 12/22/2011 2245   K 3.6 12/31/2020 1224   K 3.8 12/22/2011 2245   CL 101 12/31/2020 1224   CL 104 12/22/2011 2245   CO2 26 12/31/2020 1224   CO2 29 12/22/2011 2245   GLUCOSE 128 (H) 12/31/2020 1224   GLUCOSE 100 (H) 12/22/2011 2245   BUN 11 12/31/2020 1224   BUN 15 12/28/2019 1000   BUN 18 12/22/2011 2245   CREATININE 0.52 12/31/2020 1224   CREATININE 0.81  12/22/2011 2245   CALCIUM 8.9 12/31/2020 1224   CALCIUM 9.1 12/22/2011 2245   PROT 7.8 10/28/2020 1555   PROT 7.0 12/28/2019 1000   PROT 7.6 12/22/2011 2245   ALBUMIN 4.3 10/28/2020 1555   ALBUMIN 4.6 12/28/2019 1000   ALBUMIN 3.8 12/22/2011 2245   AST 23 10/28/2020 1555   AST 34 12/22/2011 2245   ALT 23 10/28/2020 1555   ALT 33 12/22/2011 2245   ALKPHOS 75 10/28/2020 1555   ALKPHOS 83 12/22/2011 2245   BILITOT <0.1 (L) 10/28/2020 1555   BILITOT <0.2 12/28/2019 1000   BILITOT 0.2 12/22/2011 2245   GFRNONAA >60 12/31/2020 1224   GFRNONAA >60 12/22/2011 2245   GFRAA 95 12/28/2019 1000   GFRAA >60 12/22/2011 2245      ASSESSMENT AND PLAN 70 y.o. year old female  has a past medical history of Breast cancer ([place]), Cognitive dysfunction, Diabetes mellitus, type 2 ([place]), Family history of breast cancer, High cholesterol, History of 2019 novel coronavirus disease (COVID-19) (11/04/2020), Hypertension, Meningitis, Obesity (BMI 30-39.9), Overactive bladder, and Seizure ([place]). here with:  1.  Seizures  -Continue carbamazepine 200 mg 3 times a day -Blood work today-CBC, CMP and carbamazepine level -Advised if she has any seizure event she should let us know -Follow-up in 1 year or sooner if needed     Ward Givens, MSN, NP-C 01/02/2021, 10:03 AM [facility] [address] [place], [place] [address] [phone]

## 2021-01-02 NOTE — Patient Instructions (Signed)
Continue Carbamazepine Blood work today If you have any seizure events please let us know.

## 2021-01-03 ENCOUNTER — Encounter: Payer: Self-pay | Admitting: Oncology

## 2021-01-03 ENCOUNTER — Ambulatory Visit: Payer: Medicare Other

## 2021-01-03 ENCOUNTER — Ambulatory Visit: Payer: Medicare Other | Admitting: Oncology

## 2021-01-03 ENCOUNTER — Other Ambulatory Visit: Payer: Medicare Other

## 2021-01-03 LAB — COMPREHENSIVE METABOLIC PANEL
ALT: 15 IU/L (ref 0–32)
AST: 17 IU/L (ref 0–40)
Albumin/Globulin Ratio: 2 (ref 1.2–2.2)
Albumin: 4.5 g/dL (ref 3.8–4.8)
Alkaline Phosphatase: 96 IU/L (ref 44–121)
BUN/Creatinine Ratio: 16 (ref 12–28)
BUN: 10 mg/dL (ref 8–27)
Bilirubin Total: <0.2 mg/dL (ref 0.0–1.2)
CO2: 26 mmol/L (ref 20–29)
Calcium: 9.6 mg/dL (ref 8.7–10.3)
Chloride: 100 mmol/L (ref 96–106)
Creatinine, Ser: 0.63 mg/dL (ref 0.57–1.00)
Globulin, Total: 2.3 g/dL (ref 1.5–4.5)
Glucose: 189 mg/dL — ABNORMAL HIGH (ref 70–99)
Potassium: 4.3 mmol/L (ref 3.5–5.2)
Sodium: 142 mmol/L (ref 134–144)
Total Protein: 6.8 g/dL (ref 6.0–8.5)
eGFR: 97 mL/min/{1.73_m2} (ref >59)

## 2021-01-03 LAB — CBC WITH DIFFERENTIAL/PLATELET
Basophils Absolute: 0.1 10*3/uL (ref 0.0–0.2)
Basos: 1 %
EOS (ABSOLUTE): 0.3 10*3/uL (ref 0.0–0.4)
Eos: 7 %
Hematocrit: 38.6 % (ref 34.0–46.6)
Hemoglobin: 12.8 g/dL (ref 11.1–15.9)
Immature Grans (Abs): 0 10*3/uL (ref 0.0–0.1)
Immature Granulocytes: 0 %
Lymphocytes Absolute: 1.4 10*3/uL (ref 0.7–3.1)
Lymphs: 28 %
MCH: 30.5 pg (ref 26.6–33.0)
MCHC: 33.2 g/dL (ref 31.5–35.7)
MCV: 92 fL (ref 79–97)
Monocytes Absolute: 0.4 10*3/uL (ref 0.1–0.9)
Monocytes: 9 %
Neutrophils Absolute: 2.7 10*3/uL (ref 1.4–7.0)
Neutrophils: 55 %
Platelets: 221 10*3/uL (ref 150–450)
RBC: 4.2 x10E6/uL (ref 3.77–5.28)
RDW: 12.5 % (ref 11.7–15.4)
WBC: 4.8 10*3/uL (ref 3.4–10.8)

## 2021-01-03 LAB — CARBAMAZEPINE LEVEL, TOTAL: Carbamazepine (Tegretol), S: 9.2 ug/mL (ref 4.0–12.0)

## 2021-01-07 ENCOUNTER — Ambulatory Visit: Payer: Medicare Other | Admitting: Oncology

## 2021-01-07 ENCOUNTER — Ambulatory Visit: Payer: Medicare Other

## 2021-01-07 ENCOUNTER — Other Ambulatory Visit: Payer: Medicare Other

## 2021-01-08 ENCOUNTER — Inpatient Hospital Stay: Payer: Medicare Other

## 2021-01-08 ENCOUNTER — Encounter: Payer: Self-pay | Admitting: Oncology

## 2021-01-08 ENCOUNTER — Other Ambulatory Visit: Payer: Self-pay

## 2021-01-08 ENCOUNTER — Inpatient Hospital Stay (HOSPITAL_BASED_OUTPATIENT_CLINIC_OR_DEPARTMENT_OTHER): Payer: Medicare Other | Admitting: Oncology

## 2021-01-08 ENCOUNTER — Inpatient Hospital Stay: Payer: Medicare Other | Admitting: Oncology

## 2021-01-08 VITALS — BP 122/59 | HR 69 | Temp 96.4°F

## 2021-01-08 VITALS — BP 131/80 | HR 68 | Temp 97.5°F | Resp 16 | Wt 195.5 lb

## 2021-01-08 DIAGNOSIS — R739 Hyperglycemia, unspecified: Secondary | ICD-10-CM | POA: Diagnosis not present

## 2021-01-08 DIAGNOSIS — E119 Type 2 diabetes mellitus without complications: Secondary | ICD-10-CM

## 2021-01-08 DIAGNOSIS — Z170 Estrogen receptor positive status [ER+]: Secondary | ICD-10-CM

## 2021-01-08 DIAGNOSIS — C50912 Malignant neoplasm of unspecified site of left female breast: Secondary | ICD-10-CM

## 2021-01-08 DIAGNOSIS — Z7189 Other specified counseling: Secondary | ICD-10-CM

## 2021-01-08 DIAGNOSIS — Z5111 Encounter for antineoplastic chemotherapy: Secondary | ICD-10-CM | POA: Diagnosis not present

## 2021-01-08 DIAGNOSIS — Z803 Family history of malignant neoplasm of breast: Secondary | ICD-10-CM

## 2021-01-08 DIAGNOSIS — Z79899 Other long term (current) drug therapy: Secondary | ICD-10-CM

## 2021-01-08 DIAGNOSIS — I10 Essential (primary) hypertension: Secondary | ICD-10-CM

## 2021-01-08 LAB — CBC WITH DIFFERENTIAL/PLATELET
Abs Immature Granulocytes: 0.05 10*3/uL (ref 0.00–0.07)
Basophils Absolute: 0.1 10*3/uL (ref 0.0–0.1)
Basophils Relative: 1 %
Eosinophils Absolute: 0 10*3/uL (ref 0.0–0.5)
Eosinophils Relative: 0 %
HCT: 36.9 % (ref 36.0–46.0)
Hemoglobin: 12.6 g/dL (ref 12.0–15.0)
Immature Granulocytes: 1 %
Lymphocytes Relative: 21 %
Lymphs Abs: 1.6 10*3/uL (ref 0.7–4.0)
MCH: 31.4 pg (ref 26.0–34.0)
MCHC: 34.1 g/dL (ref 30.0–36.0)
MCV: 92 fL (ref 80.0–100.0)
Monocytes Absolute: 0.4 10*3/uL (ref 0.1–1.0)
Monocytes Relative: 5 %
Neutro Abs: 5.8 10*3/uL (ref 1.7–7.7)
Neutrophils Relative %: 72 %
Platelets: 234 10*3/uL (ref 150–400)
RBC: 4.01 MIL/uL (ref 3.87–5.11)
RDW: 12.3 % (ref 11.5–15.5)
WBC: 8 10*3/uL (ref 4.0–10.5)
nRBC: 0 % (ref 0.0–0.2)

## 2021-01-08 LAB — COMPREHENSIVE METABOLIC PANEL
ALT: 21 U/L (ref 0–44)
AST: 25 U/L (ref 15–41)
Albumin: 4 g/dL (ref 3.5–5.0)
Alkaline Phosphatase: 67 U/L (ref 38–126)
Anion gap: 10 (ref 5–15)
BUN: 17 mg/dL (ref 8–23)
CO2: 26 mmol/L (ref 22–32)
Calcium: 8.9 mg/dL (ref 8.9–10.3)
Chloride: 100 mmol/L (ref 98–111)
Creatinine, Ser: 0.77 mg/dL (ref 0.44–1.00)
GFR, Estimated: >60 mL/min (ref >60)
Glucose, Bld: 289 mg/dL — ABNORMAL HIGH (ref 70–99)
Potassium: 3.6 mmol/L (ref 3.5–5.1)
Sodium: 136 mmol/L (ref 135–145)
Total Bilirubin: 0.3 mg/dL (ref 0.3–1.2)
Total Protein: 7 g/dL (ref 6.5–8.1)

## 2021-01-08 MED ORDER — SODIUM CHLORIDE 0.9 % IV SOLN
10.0000 mg | Freq: Once | INTRAVENOUS | Status: AC
  Administered 2021-01-08: 10:00:00 10 mg via INTRAVENOUS
  Filled 2021-01-08: qty 10

## 2021-01-08 MED ORDER — PALONOSETRON HCL INJECTION 0.25 MG/5ML
0.2500 mg | Freq: Once | INTRAVENOUS | Status: AC
  Administered 2021-01-08: 10:00:00 0.25 mg via INTRAVENOUS
  Filled 2021-01-08: qty 5

## 2021-01-08 MED ORDER — SODIUM CHLORIDE 0.9 % IV SOLN
70.0000 mg/m2 | Freq: Once | INTRAVENOUS | Status: AC
  Administered 2021-01-08: 10:00:00 140 mg via INTRAVENOUS
  Filled 2021-01-08: qty 14

## 2021-01-08 MED ORDER — PEGFILGRASTIM 6 MG/0.6ML ~~LOC~~ PSKT
6.0000 mg | PREFILLED_SYRINGE | Freq: Once | SUBCUTANEOUS | Status: AC
  Administered 2021-01-08: 13:00:00 6 mg via SUBCUTANEOUS
  Filled 2021-01-08: qty 0.6

## 2021-01-08 MED ORDER — SODIUM CHLORIDE 0.9 % IV SOLN
Freq: Once | INTRAVENOUS | Status: AC
  Filled 2021-01-08: qty 250

## 2021-01-08 MED ORDER — SODIUM CHLORIDE 0.9 % IV SOLN
1200.0000 mg | Freq: Once | INTRAVENOUS | Status: AC
  Administered 2021-01-08: 12:00:00 1200 mg via INTRAVENOUS
  Filled 2021-01-08: qty 50

## 2021-01-08 MED ORDER — LORATADINE 10 MG PO TABS: 10.0000 mg | ORAL_TABLET | Freq: Every day | ORAL | 0 refills | Status: AC

## 2021-01-08 NOTE — Progress Notes (Signed)
Pt requesting rx for Claritin. Pt asking if she will need injection after chemo

## 2021-01-08 NOTE — Progress Notes (Signed)
Hematology/Oncology Progress Note [phone] [phone]   Patient Care Team: Tamara Ruths, MD as PCP - General (Internal Medicine)  REFERRING PROVIDER: Kirk Ruths, MD  CHIEF COMPLAINTS/REASON FOR VISIT:  Evaluation of left breast cancer  HISTORY OF PRESENTING ILLNESS:   Tamara Mejia is a  70 y.o.  female with PMH listed below was seen in consultation at the request of  Tamara Ruths, MD  for evaluation of left breast cancer.  She has screening mammogram done which recommends additional left breast diagnostic mammogram.  09/26/2020 unilateral left diagnostic mammogram showed there is a suspicious 17 mm mass in the LEFT upper breast which is concerning for malignancy. Recommend ultrasound-guided biopsy for definitive characterization. No suspicious LEFT axillary adenopathy.  10/08/2020 left breast US guided biopsy showed invasive mammary carcinoma, no special type. Grade 3, no DCIS, no LVI.  ER 51-90%, PR 51-90%, HER2 negative.   Family history of breast cancer: maternal cousin breast cancer. Great aunt breast cancer.  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  lives in a group home. She has a guardian, Tamara Mejia who is her maternal cousin.   11/22/2020 stage IB left breast cancer status postlumpectomy and sentinel lymph node biopsy, ER/PR+, HER2 - pT1c pN1a M0 Mammaprint came back high risk, 29% recurrence risk in 10 years  INTERVAL HISTORY Tamara Mejia is a 70 y.o. female who has above history reviewed by me today presents for follow up visit for evaluation prior to adjuvant chemotherapy TC Patient's guardian has signed chemotherapy consent during interval.  A  copy of the consent was scanned into epic media.  Patient was accompanied by a caregiver Tamara Mejia.  No new complaints. Patient has started dexamethasone yesterday per instruction.   Review of Systems  Constitutional:  Negative for appetite change, chills, fatigue and fever.  HENT:   Negative for hearing loss and voice change.   Eyes:  Negative for eye problems.  Respiratory:  Negative for chest tightness and cough.   Cardiovascular:  Negative for chest pain.  Gastrointestinal:  Negative for abdominal distention, abdominal pain and blood in stool.  Endocrine: Negative for hot flashes.  Genitourinary:  Negative for difficulty urinating and frequency.   Musculoskeletal:  Negative for arthralgias.  Skin:  Negative for itching and rash.  Neurological:  Negative for extremity weakness.  Hematological:  Negative for adenopathy.  Psychiatric/Behavioral:  Negative for confusion.    MEDICAL HISTORY:  Past Medical History:  Diagnosis Date   Breast cancer ([place])    Cognitive dysfunction    mental retardation (mild to moderate)   Diabetes mellitus, type 2 (HCC)    diet controlled   Family history of breast cancer    High cholesterol    History of 2019 novel coronavirus disease (COVID-19) 11/04/2020   a.) resides in group home; others were sick with SARS-Cov-2. Home test (+).   Hypertension    Meningitis    in childhood   Obesity (BMI 30-39.9)    Overactive bladder    Seizure ([place])    last seizure 2000    SURGICAL HISTORY: Past Surgical History:  Procedure Laterality Date   ABDOMINAL HYSTERECTOMY     as a teenager   BREAST BIOPSY Right    neg   BREAST  BIOPSY Left 10/18/2020   u/s breast  biopsy, "venus" clip-INVASIVE MAMMARY CARCINOMA   CHOLECYSTECTOMY     COLONOSCOPY WITH PROPOFOL N/A 08/27/2015   Procedure: COLONOSCOPY WITH PROPOFOL;  Surgeon: Lucilla Lame, MD;  Location: [facility] ENDOSCOPY;  Service: Endoscopy;  Laterality: N/A;   PART MASTECTOMY,RADIO FREQUENCY LOCALIZER,AXILLARY  SENTINEL NODE BIOPSY Left 11/22/2020   Procedure: PART MASTECTOMY, RADIO FREQUENCY LOCALIZER,AXILLARY SENTINEL NODE BIOPSY;  Surgeon: Herbert Pun, MD;  Location: [facility] ORS;  Service: General;  Laterality: Left;   PORTACATH PLACEMENT N/A 12/31/2020   Procedure: INSERTION PORT-A-CATH;  Surgeon: Herbert Pun, MD;  Location: [facility] ORS;  Service: General;  Laterality: N/A;    SOCIAL HISTORY: Social History   Socioeconomic History   Marital status: Single    Spouse name: Not on file   Number of children: Not on file   Years of education: Not on file   Highest education level: Not on file  Occupational History   Not on file  Tobacco Use   Smoking status: Never   Smokeless tobacco: Never  Substance and Sexual Activity   Alcohol use: No   Drug use: No   Sexual activity: Not on file  Other Topics Concern   Not on file  Social History Narrative   Patient works for cutting [employer]   Patient has her adult education certificate   Patient drinks about 2 cups of coffee daily.          Social Determinants of Health   Financial Resource Strain: Not on file  Food Insecurity: Not on file  Transportation Needs: Not on file  Physical Activity: Not on file  Stress: Not on file  Social Connections: Not on file  Intimate Partner Violence: Not on file    FAMILY HISTORY: Family History  Problem Relation Age of Onset   Diabetes Father    Heart disease Father    Heart disease Brother    Breast cancer Cousin        dx 82s    ALLERGIES:  has No Known Allergies.  MEDICATIONS:  Current Outpatient Medications  Medication Sig Dispense Refill   acetaminophen (TYLENOL) 500 MG tablet Take 1,000 mg by mouth every 8 (eight) hours as needed (pain.).     amLODipine (NORVASC) 5 MG tablet Take 5 mg by mouth in the morning.     aspirin 81 MG EC tablet Take 81 mg by mouth in the morning. Swallow whole.     carbamazepine (TEGRETOL) 200 MG tablet TAKE ONE TABLET BY MOUTH THREE TIMES  DAILY. (SEIZURE CONTROL) 90 tablet 11   Cholecalciferol (VITAMIN D) 50 MCG (2000 UT) tablet Take 4,000 Units by mouth in the morning.     dexamethasone (DECADRON) 4 MG tablet Take 2 tablets (8 mg total) by mouth 2 (two) times daily. Start the day before Taxotere. Then again the day after chemo for 3 days. 30 tablet 1   ibuprofen (ADVIL) 200 MG tablet Take 400 mg by mouth every 8 (eight) hours as needed (pain.).     lidocaine-prilocaine (EMLA) cream Apply to affected area once 30 g 3   lisinopril-hydrochlorothiazide (ZESTORETIC) 20-12.5 MG tablet Take 1 tablet by mouth every morning.     loratadine (CLARITIN) 10 MG tablet Take 1 tablet (10 mg total) by mouth daily. Take Claritin daily for 4 days, Day1-4 of each chemo cycle. 16 tablet 0   ondansetron (ZOFRAN) 8 MG tablet Take 1 tablet (8 mg total) by mouth 2 (two) times daily as needed for refractory nausea / vomiting.  Start on day 3 after chemo. 30 tablet 1   prochlorperazine (COMPAZINE) 10 MG tablet Take 1 tablet (10 mg total) by mouth every 6 (six) hours as needed (Nausea or vomiting). 30 tablet 1   simvastatin (ZOCOR) 20 MG tablet Take 20 mg by mouth in the morning.     No current facility-administered medications for this visit.     PHYSICAL EXAMINATION: ECOG PERFORMANCE STATUS: 2 - Symptomatic, <50% confined to bed Vitals:   01/08/21 0920  BP: 131/80  Pulse: 68  Resp: 16  Temp: (!) 97.5 F (36.4 C)   Filed Weights   01/08/21 0920  Weight: 195 lb 8 oz (88.7 kg)    Physical Exam Constitutional:      General: She is not in acute distress. HENT:     Head: Normocephalic and atraumatic.  Eyes:     General: No scleral icterus. Cardiovascular:     Rate and Rhythm: Normal rate and regular rhythm.     Heart sounds: Normal heart sounds.  Pulmonary:     Effort: Pulmonary effort is normal. No respiratory distress.     Breath sounds: No wheezing.  Abdominal:     General: Bowel sounds are normal. There is no distension.      Palpations: Abdomen is soft.  Musculoskeletal:        General: No deformity. Normal range of motion.     Cervical back: Normal range of motion and neck supple.  Skin:    General: Skin is warm and dry.     Findings: No erythema or rash.  Neurological:     Mental Status: She is alert and oriented to person, place, and time. Mental status is at baseline.     Cranial Nerves: No cranial nerve deficit.     Coordination: Coordination normal.  Psychiatric:        Mood and Affect: Mood normal.    LABORATORY DATA:  I have reviewed the data as listed Lab Results  Component Value Date   WBC 8.0 01/08/2021   HGB 12.6 01/08/2021   HCT 36.9 01/08/2021   MCV 92.0 01/08/2021   PLT 234 01/08/2021   Recent Labs    10/28/20 1555 12/31/20 1216 12/31/20 1224 01/02/21 1013 01/08/21 0840  NA 136   < > 136 142 136  K 4.0   < > 3.6 4.3 3.6  CL 99   < > 101 100 100  CO2 31  --  _0 GLUCOSE 164*   < > 128* 189* 289*  BUN 14   < > _1 CREATININE 0.71   < > 0.52 0.63 0.77  CALCIUM 8.9  --  8.9 9.6 8.9  GFRNONAA >60  --  >60  --  >60  PROT 7.8  --   --  6.8 7.0  ALBUMIN 4.3  --   --  4.5 4.0  AST 23  --   --  17 25  ALT 23  --   --  15 21  ALKPHOS 75  --   --  96 67  BILITOT <0.1*  --   --  <0.2 0.3   < > = values in this interval not displayed.    Iron/TIBC/Ferritin/ %Sat No results found for: IRON, TIBC, FERRITIN, IRONPCTSAT    RADIOGRAPHIC STUDIES: I have personally reviewed the radiological images as listed and agreed with the findings in the report. DG Chest Port 1 View  Result Date: 12/31/2020 CLINICAL DATA:  Port placement EXAM:  PORTABLE CHEST 1 VIEW COMPARISON:  12/22/2011 FINDINGS: Right-sided central venous port with tip overlying SVC. No pneumothorax. Mild cardiomegaly. Streaky atelectasis or scar at the left lung base. IMPRESSION: 1. Right-sided central venous port tip over the SVC. No visible pneumothorax 2. Cardiomegaly with streaky atelectasis or scar at the  left base Electronically Signed   By: Donavan Foil M.D.   On: 12/31/2020 15:34   DG C-Arm 1-60 Min-No Report  Result Date: 12/31/2020 Fluoroscopy was utilized by the requesting physician.  No radiographic interpretation.   ECHOCARDIOGRAM COMPLETE  Result Date: 01/01/2021    ECHOCARDIOGRAM REPORT   Patient Name:   JAILYNE CHIEFFO Mangan Date of Exam: 01/01/2021 Medical Rec #:  [id number]      Height:       63.5 in Accession #:    [id number]     Weight:       194.4 lb Date of Birth:  Dec 03, 1950       BSA:          1.922 m Patient Age:    18 years       BP:           155/70 mmHg Patient Gender: F              HR:           71 bpm. Exam Location:  [facility] Procedure: 2D Echo, Color Doppler and Cardiac Doppler Indications:     Z51.81, Z79.899-Encounter for monitoring cardiotoxic drug                  therapy.  History:         Patient has no prior history of Echocardiogram examinations.                  Risk Factors:Diabetes and Hypertension. Pt has history of                  Covid-19.  Sonographer:     Sherrie Sport Referring Phys:  [id number] Payton Moder Diagnosing Phys: Kate Sable MD  Sonographer Comments: Technically challenging study due to limited acoustic windows, suboptimal subcostal window and no apical window. IMPRESSIONS  1. Left ventricular ejection fraction, by estimation, is 60 to 65%. The left ventricle has normal function. The left ventricle has no regional wall motion abnormalities. Left ventricular diastolic function could not be evaluated.  2. Right ventricular systolic function is normal. The right ventricular size is normal.  3. The mitral valve is normal in structure. No evidence of mitral valve regurgitation.  4. The aortic valve is tricuspid. Aortic valve regurgitation is not visualized.  5. The inferior vena cava is normal in size with greater than 50% respiratory variability, suggesting right atrial pressure of 3 mmHg. FINDINGS  Left Ventricle: Left ventricular ejection fraction, by estimation, is 60  to 65%. The left ventricle has normal function. The left ventricle has no regional wall motion abnormalities. The left ventricular internal cavity size was normal in size. There is  no left ventricular hypertrophy. Left ventricular diastolic function could not be evaluated. Right Ventricle: The right ventricular size is normal. No increase in right ventricular wall thickness. Right ventricular systolic function is normal. Left Atrium: Left atrial size was normal in size. Right Atrium: Right atrial size was normal in size. Pericardium: There is no evidence of pericardial effusion. Mitral Valve: The mitral valve is normal in structure. No evidence of mitral valve regurgitation. Tricuspid Valve: The tricuspid valve is normal in structure. Tricuspid valve  regurgitation is not demonstrated. Aortic Valve: The aortic valve is tricuspid. Aortic valve regurgitation is not visualized. Pulmonic Valve: The pulmonic valve was normal in structure. Pulmonic valve regurgitation is not visualized. Aorta: The aortic root is normal in size and structure. Venous: The inferior vena cava is normal in size with greater than 50% respiratory variability, suggesting right atrial pressure of 3 mmHg. IAS/Shunts: No atrial level shunt detected by color flow Doppler.  LEFT VENTRICLE PLAX 2D LVIDd:         4.10 cm LVIDs:         2.90 cm LV PW:         1.30 cm LV IVS:        1.00 cm LVOT diam:     2.00 cm LVOT Area:     3.14 cm  LEFT ATRIUM         Index LA diam:    3.30 cm 1.72 cm/m                        PULMONIC VALVE AORTA                 PV Vmax:        0.84 m/s Ao Root diam: 2.93 cm PV Vmean:       59.150 cm/s                       PV VTI:         0.138 m                       PV Peak grad:   2.9 mmHg                       PV Mean grad:   1.5 mmHg                       RVOT Peak grad: 4 mmHg   SHUNTS Systemic Diam: 2.00 cm Pulmonic VTI:  0.146 m Kate Sable MD Electronically signed by Kate Sable MD Signature Date/Time:  01/01/2021/6:43:38 PM    Final       ASSESSMENT & PLAN:  1. Malignant neoplasm of left breast in female, estrogen receptor positive, unspecified site of breast ([place])   2. Encounter for antineoplastic chemotherapy   3. Hyperglycemia    Cancer Staging  Breast cancer [facility]) Staging form: Breast, AJCC 8th Edition - Pathologic stage from 12/23/2020: Stage IB (pT1c, pN1a, cM0, G3, ER+, PR+, HER2-) - Signed by Earlie Server, MD on 12/23/2020  # Stage IB left breast cancer, ER/PR+, HER2 - pT1c pN1a M0 Mammaprint came back high risk, 29% recurrence risk in 10 years.  Labs reviewed and discussed with patient.   Proceed with cycle 1 docetaxel 41m/m2/Cytoxan today.  Patient will receive on pro Neulasta for G-CSF support. Recommend patient to take Claritin 10 mg day 1 to day 4 of each chemotherapy cycle.  Prescription sent to pharmacy.  #Hypoglycemia, patient has diabetes on diet control. Hypoglycemia is likely due to dexamethasone use.  Recommend patient to tighten glycemic control with strict diabetic diet.  Follow-up with PCP closely.  She may need to be started on diabetic medication if serum glucose is persistently high.  Supportive care measures are necessary for patient well-being and will be provided as necessary. All questions are answered.  No orders of the defined types were placed in this  encounter.   All questions were answered. The patient knows to call the clinic with any problems questions or concerns.  cc Tamara Ruths, MD    Return of visit: 1week, lab NP IVF 3 weeks lab MD cycle 2 TC with Sherrine Maples, MD, PhD 01/08/2021

## 2021-01-08 NOTE — Patient Instructions (Addendum)
[facility] CANCER CTR AT [place]  Discharge Instructions: Thank you for choosing [facility] to provide your oncology and hematology care.  If you have a lab appointment with the [place], please go directly to the [facility] and check in at the registration area.  Wear comfortable clothing and clothing appropriate for easy access to any Portacath or PICC line.   We strive to give you quality time with your provider. You may need to reschedule your appointment if you arrive late (15 or more minutes).  Arriving late affects you and other patients whose appointments are after yours.  Also, if you miss three or more appointments without notifying the office, you may be dismissed from the clinic at the providers discretion.      For prescription refill requests, have your pharmacy contact our office and allow 72 hours for refills to be completed.   Today you received the following chemotherapy and/or immunotherapy agents : Taxotere / Cytoxan   To help prevent nausea and vomiting after your treatment, we encourage you to take your nausea medication as directed.  BELOW ARE SYMPTOMS THAT SHOULD BE REPORTED IMMEDIATELY: *FEVER GREATER THAN 100.4 F (38 C) OR HIGHER *CHILLS OR SWEATING *NAUSEA AND VOMITING THAT IS NOT CONTROLLED WITH YOUR NAUSEA MEDICATION *UNUSUAL SHORTNESS OF BREATH *UNUSUAL BRUISING OR BLEEDING *URINARY PROBLEMS (pain or burning when urinating, or frequent urination) *BOWEL PROBLEMS (unusual diarrhea, constipation, pain near the anus) TENDERNESS IN MOUTH AND THROAT WITH OR WITHOUT PRESENCE OF ULCERS (sore throat, sores in mouth, or a toothache) UNUSUAL RASH, SWELLING OR PAIN  UNUSUAL VAGINAL DISCHARGE OR ITCHING   Items with * indicate a potential emergency and should be followed up as soon as possible or go to the Emergency Department if any problems should occur.  Please show the CHEMOTHERAPY ALERT CARD or IMMUNOTHERAPY ALERT CARD at  check-in to the Emergency Department and triage nurse.  Should you have questions after your visit or need to cancel or reschedule your appointment, please contact [facility] CANCER [place] AT [place]  [phone] and follow the prompts.  Office hours are 8:00 a.m. to 4:30 p.m. Monday - Friday. Please note that voicemails left after 4:00 p.m. may not be returned until the following business day.  We are closed weekends and major holidays. You have access to a nurse at all times for urgent questions. Please call the main number to the clinic [phone] and follow the prompts.  For any non-urgent questions, you may also contact your provider using MyChart. We now offer e-Visits for anyone 12 and older to request care online for non-urgent symptoms. For details visit [email or web address].   Also download the MyChart app! Go to the app store, search "MyChart", open the app, select [facility], and log in with your MyChart username and password.  Due to Covid, a mask is required upon entering the hospital/clinic. If you do not have a mask, one will be given to you upon arrival. For doctor visits, patients may have 1 support person aged 67 or older with them. For treatment visits, patients cannot have anyone with them due to current Covid guidelines and our immunocompromised population.

## 2021-01-15 ENCOUNTER — Inpatient Hospital Stay: Payer: Medicare Other

## 2021-01-15 ENCOUNTER — Inpatient Hospital Stay (HOSPITAL_BASED_OUTPATIENT_CLINIC_OR_DEPARTMENT_OTHER): Payer: Medicare Other | Admitting: Oncology

## 2021-01-15 ENCOUNTER — Encounter: Payer: Self-pay | Admitting: Oncology

## 2021-01-15 ENCOUNTER — Other Ambulatory Visit: Payer: Self-pay

## 2021-01-15 VITALS — BP 115/67 | HR 105 | Temp 98.9°F | Resp 18 | Wt 191.0 lb

## 2021-01-15 VITALS — BP 112/70 | HR 94 | Resp 18

## 2021-01-15 DIAGNOSIS — R197 Diarrhea, unspecified: Secondary | ICD-10-CM | POA: Diagnosis not present

## 2021-01-15 DIAGNOSIS — R739 Hyperglycemia, unspecified: Secondary | ICD-10-CM

## 2021-01-15 DIAGNOSIS — E860 Dehydration: Secondary | ICD-10-CM | POA: Diagnosis not present

## 2021-01-15 DIAGNOSIS — Z5181 Encounter for therapeutic drug level monitoring: Secondary | ICD-10-CM

## 2021-01-15 DIAGNOSIS — C50912 Malignant neoplasm of unspecified site of left female breast: Secondary | ICD-10-CM

## 2021-01-15 DIAGNOSIS — Z7189 Other specified counseling: Secondary | ICD-10-CM

## 2021-01-15 DIAGNOSIS — Z5111 Encounter for antineoplastic chemotherapy: Secondary | ICD-10-CM | POA: Diagnosis not present

## 2021-01-15 LAB — COMPREHENSIVE METABOLIC PANEL
ALT: 22 U/L (ref 0–44)
AST: 29 U/L (ref 15–41)
Albumin: 3.8 g/dL (ref 3.5–5.0)
Alkaline Phosphatase: 81 U/L (ref 38–126)
Anion gap: 12 (ref 5–15)
BUN: 12 mg/dL (ref 8–23)
CO2: 25 mmol/L (ref 22–32)
Calcium: 8.9 mg/dL (ref 8.9–10.3)
Chloride: 95 mmol/L — ABNORMAL LOW (ref 98–111)
Creatinine, Ser: 0.74 mg/dL (ref 0.44–1.00)
GFR, Estimated: >60 mL/min (ref >60)
Glucose, Bld: 203 mg/dL — ABNORMAL HIGH (ref 70–99)
Potassium: 3.6 mmol/L (ref 3.5–5.1)
Sodium: 132 mmol/L — ABNORMAL LOW (ref 135–145)
Total Bilirubin: 0.5 mg/dL (ref 0.3–1.2)
Total Protein: 6.9 g/dL (ref 6.5–8.1)

## 2021-01-15 LAB — CBC WITH DIFFERENTIAL/PLATELET
Abs Immature Granulocytes: 0.56 10*3/uL — ABNORMAL HIGH (ref 0.00–0.07)
Basophils Absolute: 0.1 10*3/uL (ref 0.0–0.1)
Basophils Relative: 1 %
Eosinophils Absolute: 0.2 10*3/uL (ref 0.0–0.5)
Eosinophils Relative: 2 %
HCT: 36.9 % (ref 36.0–46.0)
Hemoglobin: 12.6 g/dL (ref 12.0–15.0)
Immature Granulocytes: 8 %
Lymphocytes Relative: 23 %
Lymphs Abs: 1.7 10*3/uL (ref 0.7–4.0)
MCH: 31 pg (ref 26.0–34.0)
MCHC: 34.1 g/dL (ref 30.0–36.0)
MCV: 90.7 fL (ref 80.0–100.0)
Monocytes Absolute: 1.7 10*3/uL — ABNORMAL HIGH (ref 0.1–1.0)
Monocytes Relative: 24 %
Neutro Abs: 3 10*3/uL (ref 1.7–7.7)
Neutrophils Relative %: 42 %
Platelets: 166 10*3/uL (ref 150–400)
RBC: 4.07 MIL/uL (ref 3.87–5.11)
RDW: 12.2 % (ref 11.5–15.5)
Smear Review: NORMAL
WBC: 7.1 10*3/uL (ref 4.0–10.5)
nRBC: 0.4 % — ABNORMAL HIGH (ref 0.0–0.2)

## 2021-01-15 MED ORDER — SODIUM CHLORIDE 0.9% FLUSH
10.0000 mL | INTRAVENOUS | Status: DC | PRN
  Administered 2021-01-15: 09:00:00 10 mL via INTRAVENOUS
  Filled 2021-01-15: qty 10

## 2021-01-15 MED ORDER — SODIUM CHLORIDE 0.9 % IV SOLN
Freq: Once | INTRAVENOUS | Status: AC
  Filled 2021-01-15: qty 250

## 2021-01-15 MED ORDER — HEPARIN SOD (PORK) LOCK FLUSH 100 UNIT/ML IV SOLN
500.0000 [IU] | Freq: Once | INTRAVENOUS | Status: DC
  Administered 2021-01-15: 10:00:00 500 [IU] via INTRAVENOUS
  Filled 2021-01-15: qty 5

## 2021-01-15 NOTE — Progress Notes (Signed)
Hematology/Oncology Progress Note [phone] [phone]   Patient Care Team: Kirk Ruths, MD as PCP - General (Internal Medicine)  REFERRING PROVIDER: Kirk Ruths, MD  CHIEF COMPLAINTS/REASON FOR VISIT:  Evaluation of left breast cancer  HISTORY OF PRESENTING ILLNESS: Tamara Mejia is a  70 y.o.  female with PMH listed below was seen in consultation at the request of  Kirk Ruths, MD  for evaluation of left breast cancer.  She has screening mammogram done which recommends additional left breast diagnostic mammogram.  09/26/2020 unilateral left diagnostic mammogram showed there is a suspicious 17 mm mass in the LEFT upper breast which is concerning for malignancy. Recommend ultrasound-guided biopsy for definitive characterization. No suspicious LEFT axillary adenopathy.  10/08/2020 left breast US guided biopsy showed invasive mammary carcinoma, no special type. Grade 3, no DCIS, no LVI.  ER 51-90%, PR 51-90%, HER2 negative.   Family history of breast cancer: maternal cousin breast cancer. Great aunt breast cancer.  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  lives in a group home. She has a guardian, Geraldo Pitter who is her maternal cousin.   11/22/2020 stage IB left breast cancer status postlumpectomy and sentinel lymph node biopsy, ER/PR+, HER2 - pT1c pN1a M0 Mammaprint came back high risk, 29% recurrence risk in 10 years   INTERVAL HISTORY- Patient is here for follow-up and to assess tolerance of cycle 1 Taxotere Cytoxan.  She is accompanied by her guardian.    Patient reports overall doing well and tolerating cycle 1.  Caregiver states she has been eating and  drinking well until yesterday.  They went for a long walk and feels she may have overdone it.  She has had some intermittent nausea and appetite has been low.  Reports some loose stools although she cannot recall how many.  Denies any abdominal pain.  Denies any sign of infection.  Was started on metformin this past Friday for blood sugar control.   Review of Systems  Constitutional:  Positive for fatigue. Negative for appetite change, fever and unexpected weight change.  HENT:   Negative for nosebleeds, sore throat and trouble swallowing.   Eyes: Negative.   Respiratory: Negative.  Negative for cough, shortness of breath and wheezing.   Cardiovascular: Negative.  Negative for chest pain and leg swelling.  Gastrointestinal:  Positive for diarrhea and nausea. Negative for abdominal pain, blood in stool, constipation and vomiting.  Endocrine: Negative.   Genitourinary: Negative.  Negative for bladder incontinence, hematuria and nocturia.   Musculoskeletal: Negative.  Negative for back pain and flank pain.  Skin: Negative.   Neurological: Negative.  Negative for dizziness, headaches, light-headedness and numbness.  Hematological: Negative.   Psychiatric/Behavioral: Negative.  Negative for confusion. The patient is not nervous/anxious.    MEDICAL HISTORY:  Past Medical History:  Diagnosis Date   Breast cancer ([place])    Cognitive dysfunction    mental retardation (mild to moderate)   Diabetes mellitus, type 2 (HCC)    diet controlled   Family history of breast cancer    High cholesterol    History of 2019 novel coronavirus disease (COVID-19) 11/04/2020   a.) resides in group home; others were sick with SARS-Cov-2. Home test (+).   Hypertension    Meningitis  in childhood   Obesity (BMI 30-39.9)    Overactive bladder    Seizure ([place])    last seizure 2000    SURGICAL HISTORY: Past Surgical History:  Procedure Laterality Date   ABDOMINAL HYSTERECTOMY     as a teenager   BREAST  BIOPSY Right    neg   BREAST BIOPSY Left 10/18/2020   u/s breast  biopsy, "venus" clip-INVASIVE MAMMARY CARCINOMA   CHOLECYSTECTOMY     COLONOSCOPY WITH PROPOFOL N/A 08/27/2015   Procedure: COLONOSCOPY WITH PROPOFOL;  Surgeon: Lucilla Lame, MD;  Location: [facility] ENDOSCOPY;  Service: Endoscopy;  Laterality: N/A;   PART MASTECTOMY,RADIO FREQUENCY LOCALIZER,AXILLARY SENTINEL NODE BIOPSY Left 11/22/2020   Procedure: PART MASTECTOMY, RADIO FREQUENCY LOCALIZER,AXILLARY SENTINEL NODE BIOPSY;  Surgeon: Herbert Pun, MD;  Location: [facility] ORS;  Service: General;  Laterality: Left;   PORTACATH PLACEMENT N/A 12/31/2020   Procedure: INSERTION PORT-A-CATH;  Surgeon: Herbert Pun, MD;  Location: [facility] ORS;  Service: General;  Laterality: N/A;    SOCIAL HISTORY: Social History   Socioeconomic History   Marital status: Single    Spouse name: Not on file   Number of children: Not on file   Years of education: Not on file   Highest education level: Not on file  Occupational History   Not on file  Tobacco Use   Smoking status: Never   Smokeless tobacco: Never  Substance and Sexual Activity   Alcohol use: No   Drug use: No   Sexual activity: Not on file  Other Topics Concern   Not on file  Social History Narrative   Patient works for cutting [employer]   Patient has her adult education certificate   Patient drinks about 2 cups of coffee daily.          Social Determinants of Health   Financial Resource Strain: Not on file  Food Insecurity: Not on file  Transportation Needs: Not on file  Physical Activity: Not on file  Stress: Not on file  Social Connections: Not on file  Intimate Partner Violence: Not on file    FAMILY HISTORY: Family History  Problem Relation Age of Onset   Diabetes Father    Heart disease Father    Heart disease Brother    Breast cancer Cousin        dx 17s    ALLERGIES:  has No Known Allergies.  MEDICATIONS:  Current Outpatient Medications   Medication Sig Dispense Refill   acetaminophen (TYLENOL) 500 MG tablet Take 1,000 mg by mouth every 8 (eight) hours as needed (pain.).     amLODipine (NORVASC) 5 MG tablet Take 5 mg by mouth in the morning.     aspirin 81 MG EC tablet Take 81 mg by mouth in the morning. Swallow whole.     carbamazepine (TEGRETOL) 200 MG tablet TAKE ONE TABLET BY MOUTH THREE TIMES DAILY. (SEIZURE CONTROL) 90 tablet 11   Cholecalciferol (VITAMIN D) 50 MCG (2000 UT) tablet Take 4,000 Units by mouth in the morning.     dexamethasone (DECADRON) 4 MG tablet Take 2 tablets (8 mg total) by mouth 2 (two) times daily. Start the day before Taxotere. Then again the day after chemo for 3 days. 30 tablet 1   ibuprofen (ADVIL) 200 MG tablet Take 400 mg by mouth every 8 (eight) hours as needed (pain.).     lidocaine-prilocaine (EMLA) cream Apply to affected area once 30 g 3   lisinopril-hydrochlorothiazide (ZESTORETIC) 20-12.5 MG tablet Take 1 tablet by mouth every morning.  loratadine (CLARITIN) 10 MG tablet Take 1 tablet (10 mg total) by mouth daily. Take Claritin daily for 4 days, Day1-4 of each chemo cycle. 16 tablet 0   metFORMIN (GLUCOPHAGE) 500 MG tablet Take by mouth.     ondansetron (ZOFRAN) 8 MG tablet Take 1 tablet (8 mg total) by mouth 2 (two) times daily as needed for refractory nausea / vomiting. Start on day 3 after chemo. 30 tablet 1   prochlorperazine (COMPAZINE) 10 MG tablet Take 1 tablet (10 mg total) by mouth every 6 (six) hours as needed (Nausea or vomiting). 30 tablet 1   simvastatin (ZOCOR) 20 MG tablet Take 20 mg by mouth in the morning.     No current facility-administered medications for this visit.   Facility-Administered Medications Ordered in Other Visits  Medication Dose Route Frequency Provider Last Rate Last Admin   0.9 %  sodium chloride infusion   Intravenous Once Jacquelin Hawking, NP 999 mL/hr at 01/15/21 0922 New Bag at 01/15/21 0922     PHYSICAL EXAMINATION: ECOG PERFORMANCE  STATUS: 2 - Symptomatic, <50% confined to bed Vitals:   01/15/21 0844  BP: 115/67  Pulse: (!) 105  Resp: 18  Temp: 98.9 F (37.2 C)  SpO2: 96%   Filed Weights   01/15/21 0830  Weight: 191 lb (86.6 kg)    Physical Exam Constitutional:      Appearance: Normal appearance. She is obese.  HENT:     Head: Normocephalic and atraumatic.  Eyes:     Pupils: Pupils are equal, round, and reactive to light.  Cardiovascular:     Rate and Rhythm: Normal rate and regular rhythm.     Heart sounds: Normal heart sounds. No murmur heard. Pulmonary:     Effort: Pulmonary effort is normal.     Breath sounds: Normal breath sounds. No wheezing.  Abdominal:     General: Bowel sounds are normal. There is no distension.     Palpations: Abdomen is soft.     Tenderness: There is no abdominal tenderness.  Musculoskeletal:        General: Normal range of motion.     Cervical back: Normal range of motion.  Skin:    General: Skin is warm and dry.     Findings: No rash.  Neurological:     Mental Status: She is alert and oriented to person, place, and time.  Psychiatric:        Judgment: Judgment normal.    LABORATORY DATA:  I have reviewed the data as listed Lab Results  Component Value Date   WBC 7.1 01/15/2021   HGB 12.6 01/15/2021   HCT 36.9 01/15/2021   MCV 90.7 01/15/2021   PLT 166 01/15/2021   Recent Labs    12/31/20 1224 01/02/21 1013 01/08/21 0840 01/15/21 0822  NA 136 142 136 132*  K 3.6 4.3 3.6 3.6  CL 101 100 100 95*  CO2 $Re'26 26 26 25  'bFS$ GLUCOSE 128* 189* 289* 203*  BUN $Re'11 10 17 12  'IAX$ CREATININE 0.52 0.63 0.77 0.74  CALCIUM 8.9 9.6 8.9 8.9  GFRNONAA >60  --  >60 >60  PROT  --  6.8 7.0 6.9  ALBUMIN  --  4.5 4.0 3.8  AST  --  $R'17 25 29  'Fs$ ALT  --  $R'15 21 22  'gB$ ALKPHOS  --  96 67 81  BILITOT  --  <0.2 0.3 0.5   Iron/TIBC/Ferritin/ %Sat No results found for: IRON, TIBC, FERRITIN, IRONPCTSAT    RADIOGRAPHIC  STUDIES: I have personally reviewed the radiological images as  listed and agreed with the findings in the report. DG Chest Port 1 View  Result Date: 12/31/2020 CLINICAL DATA:  Port placement EXAM: PORTABLE CHEST 1 VIEW COMPARISON:  12/22/2011 FINDINGS: Right-sided central venous port with tip overlying SVC. No pneumothorax. Mild cardiomegaly. Streaky atelectasis or scar at the left lung base. IMPRESSION: 1. Right-sided central venous port tip over the SVC. No visible pneumothorax 2. Cardiomegaly with streaky atelectasis or scar at the left base Electronically Signed   By: Donavan Foil M.D.   On: 12/31/2020 15:34   DG C-Arm 1-60 Min-No Report  Result Date: 12/31/2020 Fluoroscopy was utilized by the requesting physician.  No radiographic interpretation.   ECHOCARDIOGRAM COMPLETE  Result Date: 01/01/2021    ECHOCARDIOGRAM REPORT   Patient Name:   BLANKA ROCKHOLT [place] Date of Exam: 01/01/2021 Medical Rec #:  [id number]      Height:       63.5 in Accession #:    [id number]     Weight:       194.4 lb Date of Birth:  01-Mar-1950       BSA:          1.922 m Patient Age:    54 years       BP:           155/70 mmHg Patient Gender: F              HR:           71 bpm. Exam Location:  [facility] Procedure: 2D Echo, Color Doppler and Cardiac Doppler Indications:     Z51.81, Z79.899-Encounter for monitoring cardiotoxic drug                  therapy.  History:         Patient has no prior history of Echocardiogram examinations.                  Risk Factors:Diabetes and Hypertension. Pt has history of                  Covid-19.  Sonographer:     Sherrie Sport Referring Phys:  [id number] ZHOU YU Diagnosing Phys: Kate Sable MD  Sonographer Comments: Technically challenging study due to limited acoustic windows, suboptimal subcostal window and no apical window. IMPRESSIONS  1. Left ventricular ejection fraction, by estimation, is 60 to 65%. The left ventricle has normal function. The left ventricle has no regional wall motion abnormalities. Left ventricular diastolic function could not be  evaluated.  2. Right ventricular systolic function is normal. The right ventricular size is normal.  3. The mitral valve is normal in structure. No evidence of mitral valve regurgitation.  4. The aortic valve is tricuspid. Aortic valve regurgitation is not visualized.  5. The inferior vena cava is normal in size with greater than 50% respiratory variability, suggesting right atrial pressure of 3 mmHg. FINDINGS  Left Ventricle: Left ventricular ejection fraction, by estimation, is 60 to 65%. The left ventricle has normal function. The left ventricle has no regional wall motion abnormalities. The left ventricular internal cavity size was normal in size. There is  no left ventricular hypertrophy. Left ventricular diastolic function could not be evaluated. Right Ventricle: The right ventricular size is normal. No increase in right ventricular wall thickness. Right ventricular systolic function is normal. Left Atrium: Left atrial size was normal in size. Right Atrium: Right atrial size was normal in size. Pericardium:  There is no evidence of pericardial effusion. Mitral Valve: The mitral valve is normal in structure. No evidence of mitral valve regurgitation. Tricuspid Valve: The tricuspid valve is normal in structure. Tricuspid valve regurgitation is not demonstrated. Aortic Valve: The aortic valve is tricuspid. Aortic valve regurgitation is not visualized. Pulmonic Valve: The pulmonic valve was normal in structure. Pulmonic valve regurgitation is not visualized. Aorta: The aortic root is normal in size and structure. Venous: The inferior vena cava is normal in size with greater than 50% respiratory variability, suggesting right atrial pressure of 3 mmHg. IAS/Shunts: No atrial level shunt detected by color flow Doppler.  LEFT VENTRICLE PLAX 2D LVIDd:         4.10 cm LVIDs:         2.90 cm LV PW:         1.30 cm LV IVS:        1.00 cm LVOT diam:     2.00 cm LVOT Area:     3.14 cm  LEFT ATRIUM         Index LA diam:     3.30 cm 1.72 cm/m                        PULMONIC VALVE AORTA                 PV Vmax:        0.84 m/s Ao Root diam: 2.93 cm PV Vmean:       59.150 cm/s                       PV VTI:         0.138 m                       PV Peak grad:   2.9 mmHg                       PV Mean grad:   1.5 mmHg                       RVOT Peak grad: 4 mmHg   SHUNTS Systemic Diam: 2.00 cm Pulmonic VTI:  0.146 m Kate Sable MD Electronically signed by Kate Sable MD Signature Date/Time: 01/01/2021/6:43:38 PM    Final       ASSESSMENT & PLAN:  1. Malignant neoplasm of left female breast, unspecified estrogen receptor status, unspecified site of breast ([place])   2. Dehydration   3. Hyperglycemia   4. Diarrhea, unspecified type    Cancer Staging  Malignant neoplasm of left breast in female, estrogen receptor positive ([place]) Staging form: Breast, AJCC 8th Edition - Pathologic stage from 12/23/2020: Stage IB (pT1c, pN1a, cM0, G3, ER+, PR+, HER2-) - Signed by Earlie Server, MD on 12/23/2020  Plan-patient is here for follow-up after cycle 1 of Taxotere Cytoxan plus on pro Neulasta for stage Ib left breast cancer.  MammaPrint came back high at 29% for risk of recurrence in 10 years.  Patient reports tolerating cycle 1 well.  Discussed labs with patient and caregiver.  Lab work shows mild hyponatremia with a sodium level of 132.  Hemoglobin and white blood cell count are normal.  Platelets are stable.  Blood sugar is 203.  Hyperglycemia-started on metformin this past Friday.  Diarrhea/loose stools-multifactorial.  Likely secondary to starting metformin as well as chemotherapy.  If she begins  having several loose stools daily can use Imodium over-the-counter 2 mg as needed.  Nausea-antiemetics as needed.   Disposition- 500 mL IV fluids today. RTC as scheduled to see Dr. Tasia Catchings prior to cycle 2.  I spent 25 minutes dedicated to the care of this patient (face-to-face and non-face-to-face) on the date of the encounter to  include what is described in the assessment and plan.  Faythe Casa, NP 01/15/2021 9:32 AM

## 2021-01-15 NOTE — Progress Notes (Signed)
Patient here for oncology follow-up appointment,  concerns of N/D

## 2021-01-15 NOTE — Progress Notes (Signed)
Patient tolerated 500 cc IVF infusion well today, no concerns voiced. Patient discharged with personal aid. Apts given. Stable.

## 2021-01-15 NOTE — Patient Instructions (Signed)

## 2021-01-29 ENCOUNTER — Inpatient Hospital Stay: Payer: Medicare Other

## 2021-01-29 ENCOUNTER — Inpatient Hospital Stay: Payer: Medicare Other | Attending: Oncology

## 2021-01-29 ENCOUNTER — Encounter: Payer: Self-pay | Admitting: Oncology

## 2021-01-29 ENCOUNTER — Inpatient Hospital Stay (HOSPITAL_BASED_OUTPATIENT_CLINIC_OR_DEPARTMENT_OTHER): Payer: Medicare Other | Admitting: Oncology

## 2021-01-29 ENCOUNTER — Other Ambulatory Visit: Payer: Self-pay

## 2021-01-29 VITALS — BP 124/64 | HR 86 | Temp 96.3°F | Resp 18 | Wt 187.6 lb

## 2021-01-29 DIAGNOSIS — Z170 Estrogen receptor positive status [ER+]: Secondary | ICD-10-CM

## 2021-01-29 DIAGNOSIS — Z5111 Encounter for antineoplastic chemotherapy: Secondary | ICD-10-CM

## 2021-01-29 DIAGNOSIS — Z803 Family history of malignant neoplasm of breast: Secondary | ICD-10-CM | POA: Diagnosis not present

## 2021-01-29 DIAGNOSIS — E1165 Type 2 diabetes mellitus with hyperglycemia: Secondary | ICD-10-CM | POA: Diagnosis not present

## 2021-01-29 DIAGNOSIS — C50912 Malignant neoplasm of unspecified site of left female breast: Secondary | ICD-10-CM

## 2021-01-29 DIAGNOSIS — Z5181 Encounter for therapeutic drug level monitoring: Secondary | ICD-10-CM

## 2021-01-29 DIAGNOSIS — R739 Hyperglycemia, unspecified: Secondary | ICD-10-CM

## 2021-01-29 DIAGNOSIS — R634 Abnormal weight loss: Secondary | ICD-10-CM | POA: Diagnosis not present

## 2021-01-29 DIAGNOSIS — I10 Essential (primary) hypertension: Secondary | ICD-10-CM | POA: Diagnosis not present

## 2021-01-29 DIAGNOSIS — F79 Unspecified intellectual disabilities: Secondary | ICD-10-CM | POA: Diagnosis not present

## 2021-01-29 DIAGNOSIS — Z90710 Acquired absence of both cervix and uterus: Secondary | ICD-10-CM | POA: Diagnosis not present

## 2021-01-29 DIAGNOSIS — Z7189 Other specified counseling: Secondary | ICD-10-CM

## 2021-01-29 LAB — COMPREHENSIVE METABOLIC PANEL
ALT: 39 U/L (ref 0–44)
AST: 29 U/L (ref 15–41)
Albumin: 3.2 g/dL — ABNORMAL LOW (ref 3.5–5.0)
Alkaline Phosphatase: 74 U/L (ref 38–126)
Anion gap: 9 (ref 5–15)
BUN: 16 mg/dL (ref 8–23)
CO2: 25 mmol/L (ref 22–32)
Calcium: 8.7 mg/dL — ABNORMAL LOW (ref 8.9–10.3)
Chloride: 101 mmol/L (ref 98–111)
Creatinine, Ser: 0.63 mg/dL (ref 0.44–1.00)
GFR, Estimated: >60 mL/min (ref >60)
Glucose, Bld: 211 mg/dL — ABNORMAL HIGH (ref 70–99)
Potassium: 3.7 mmol/L (ref 3.5–5.1)
Sodium: 135 mmol/L (ref 135–145)
Total Bilirubin: 0.4 mg/dL (ref 0.3–1.2)
Total Protein: 7.3 g/dL (ref 6.5–8.1)

## 2021-01-29 LAB — CBC WITH DIFFERENTIAL/PLATELET
Abs Immature Granulocytes: 0.03 10*3/uL (ref 0.00–0.07)
Basophils Absolute: 0.1 10*3/uL (ref 0.0–0.1)
Basophils Relative: 1 %
Eosinophils Absolute: 0.1 10*3/uL (ref 0.0–0.5)
Eosinophils Relative: 1 %
HCT: 32 % — ABNORMAL LOW (ref 36.0–46.0)
Hemoglobin: 10.7 g/dL — ABNORMAL LOW (ref 12.0–15.0)
Immature Granulocytes: 1 %
Lymphocytes Relative: 23 %
Lymphs Abs: 1.3 10*3/uL (ref 0.7–4.0)
MCH: 31.1 pg (ref 26.0–34.0)
MCHC: 33.4 g/dL (ref 30.0–36.0)
MCV: 93 fL (ref 80.0–100.0)
Monocytes Absolute: 0.6 10*3/uL (ref 0.1–1.0)
Monocytes Relative: 10 %
Neutro Abs: 3.7 10*3/uL (ref 1.7–7.7)
Neutrophils Relative %: 64 %
Platelets: 569 10*3/uL — ABNORMAL HIGH (ref 150–400)
RBC: 3.44 MIL/uL — ABNORMAL LOW (ref 3.87–5.11)
RDW: 12.8 % (ref 11.5–15.5)
WBC: 5.8 10*3/uL (ref 4.0–10.5)
nRBC: 0 % (ref 0.0–0.2)

## 2021-01-29 MED ORDER — PALONOSETRON HCL INJECTION 0.25 MG/5ML
0.2500 mg | Freq: Once | INTRAVENOUS | Status: AC
  Administered 2021-01-29: 0.25 mg via INTRAVENOUS
  Filled 2021-01-29: qty 5

## 2021-01-29 MED ORDER — SODIUM CHLORIDE 0.9 % IV SOLN
70.0000 mg/m2 | Freq: Once | INTRAVENOUS | Status: AC
  Administered 2021-01-29: 140 mg via INTRAVENOUS
  Filled 2021-01-29: qty 14

## 2021-01-29 MED ORDER — PEGFILGRASTIM 6 MG/0.6ML ~~LOC~~ PSKT
6.0000 mg | PREFILLED_SYRINGE | Freq: Once | SUBCUTANEOUS | Status: AC
  Administered 2021-01-29: 6 mg via SUBCUTANEOUS
  Filled 2021-01-29: qty 0.6

## 2021-01-29 MED ORDER — HEPARIN SOD (PORK) LOCK FLUSH 100 UNIT/ML IV SOLN
500.0000 [IU] | Freq: Once | INTRAVENOUS | Status: AC | PRN
  Administered 2021-01-29: 500 [IU]
  Filled 2021-01-29: qty 5

## 2021-01-29 MED ORDER — SODIUM CHLORIDE 0.9 % IV SOLN
10.0000 mg | Freq: Once | INTRAVENOUS | Status: AC
  Administered 2021-01-29: 10 mg via INTRAVENOUS
  Filled 2021-01-29: qty 10

## 2021-01-29 MED ORDER — SODIUM CHLORIDE 0.9 % IV SOLN
Freq: Once | INTRAVENOUS | Status: AC
  Filled 2021-01-29: qty 250

## 2021-01-29 MED ORDER — SODIUM CHLORIDE 0.9 % IV SOLN
1200.0000 mg | Freq: Once | INTRAVENOUS | Status: AC
  Administered 2021-01-29: 1200 mg via INTRAVENOUS
  Filled 2021-01-29: qty 50

## 2021-01-29 NOTE — Progress Notes (Signed)
Hematology/Oncology Progress Note [phone] [phone]   Patient Care Team: Tamara Ruths, MD as PCP - General (Internal Medicine)  REFERRING PROVIDER: Kirk Ruths, MD  CHIEF COMPLAINTS/REASON FOR VISIT:  left breast cancer  HISTORY OF PRESENTING ILLNESS:   Tamara Mejia is a  71 y.o.  female with PMH listed below was seen in consultation at the request of  Tamara Ruths, MD  for evaluation of left breast cancer.  She has screening mammogram done which recommends additional left breast diagnostic mammogram.  09/26/2020 unilateral left diagnostic mammogram showed there is a suspicious 17 mm mass in the LEFT upper breast which is concerning for malignancy. Recommend ultrasound-guided biopsy for definitive characterization. No suspicious LEFT axillary adenopathy.  10/08/2020 left breast US guided biopsy showed invasive mammary carcinoma, no special type. Grade 3, no DCIS, no LVI.  ER 51-90%, PR 51-90%, HER2 negative.   Family history of breast cancer: maternal cousin breast cancer. Great aunt breast cancer.  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  lives in a group home. She has a guardian, Tamara Mejia who is her maternal cousin.   11/22/2020 stage IB left breast cancer status postlumpectomy and sentinel lymph node biopsy, ER/PR+, HER2 - pT1c pN1a M0 Mammaprint came back high risk, 29% recurrence risk in 10 years  #01/09/2019 start Tamara Mejia is a 71 y.o. female who has above history reviewed by me today presents for follow up visit for evaluation prior to adjuvant chemotherapy TC Patient was accompanied by her caregiver.  Patient had  mild nausea which improved with antiemetics.  Otherwise tolerating for cycle of chemotherapy very well.    Review of Systems  Constitutional:  Negative for appetite change, chills, fatigue and fever.  HENT:   Negative for hearing loss and voice change.   Eyes:  Negative for eye problems.  Respiratory:  Negative for chest tightness and cough.   Cardiovascular:  Negative for chest pain.  Gastrointestinal:  Positive for nausea. Negative for abdominal distention, abdominal pain and blood in stool.  Endocrine: Negative for hot flashes.  Genitourinary:  Negative for difficulty urinating and frequency.   Musculoskeletal:  Negative for arthralgias.  Skin:  Negative for itching and rash.  Neurological:  Negative for extremity weakness.  Hematological:  Negative for adenopathy.  Psychiatric/Behavioral:  Negative for confusion.    MEDICAL HISTORY:  Past Medical History:  Diagnosis Date   Breast cancer ([place])    Cognitive dysfunction    mental retardation (mild to moderate)   Diabetes mellitus, type 2 (HCC)    diet controlled   Family history of breast cancer    High cholesterol    History of 2019 novel coronavirus disease (COVID-19) 11/04/2020   a.) resides in group home; others were sick with SARS-Cov-2. Home test (+).   Hypertension    Meningitis    in childhood   Obesity (BMI 30-39.9)    Overactive bladder    Seizure ([place])    last seizure 2000    SURGICAL HISTORY: Past Surgical History:  Procedure Laterality Date   ABDOMINAL HYSTERECTOMY     as a teenager   BREAST BIOPSY Right    neg   BREAST BIOPSY Left 10/18/2020   u/s breast  biopsy, "venus" clip-INVASIVE MAMMARY CARCINOMA   CHOLECYSTECTOMY     COLONOSCOPY WITH PROPOFOL N/A 08/27/2015   Procedure: COLONOSCOPY WITH PROPOFOL;  Surgeon: Tamara Lame, MD;  Location: [facility];  Service: Endoscopy;  Laterality: N/A;   PART MASTECTOMY,RADIO FREQUENCY LOCALIZER,AXILLARY SENTINEL NODE BIOPSY Left 11/22/2020   Procedure: PART  MASTECTOMY, RADIO FREQUENCY LOCALIZER,AXILLARY SENTINEL NODE BIOPSY;  Surgeon: Tamara Pun, MD;  Location: [facility] ORS;  Service: General;  Laterality: Left;   PORTACATH PLACEMENT N/A 12/31/2020   Procedure: INSERTION PORT-A-CATH;  Surgeon: Tamara Pun, MD;  Location: [facility] ORS;  Service: General;  Laterality: N/A;    SOCIAL HISTORY: Social History   Socioeconomic History   Marital status: Single    Spouse name: Not on file   Number of children: Not on file   Years of education: Not on file   Highest education level: Not on file  Occupational History   Not on file  Tobacco Use   Smoking status: Never   Smokeless tobacco: Never  Substance and Sexual Activity   Alcohol use: No   Drug use: No   Sexual activity: Not on file  Other Topics Concern   Not on file  Social History Narrative   Patient works for cutting [employer]   Patient has her adult education certificate   Patient drinks about 2 cups of coffee daily.          Social Determinants of Health   Financial Resource Strain: Not on file  Food Insecurity: Not on file  Transportation Needs: Not on file  Physical Activity: Not on file  Stress: Not on file  Social Connections: Not on file  Intimate Partner Violence: Not on file    FAMILY HISTORY: Family History  Problem Relation Age of Onset   Diabetes Father    Heart disease Father    Heart disease Brother    Breast cancer Cousin        dx 45s    ALLERGIES:  has No Known Allergies.  MEDICATIONS:  Current Outpatient Medications  Medication Sig Dispense Refill   acetaminophen (TYLENOL) 500 MG tablet Take 1,000 mg by mouth every 8 (eight) hours as needed (pain.).     amLODipine (NORVASC) 5 MG tablet Take 5 mg by mouth in the morning.     aspirin 81 MG EC tablet Take 81 mg by mouth in the morning. Swallow whole.     carbamazepine (TEGRETOL) 200 MG tablet TAKE ONE TABLET BY MOUTH THREE TIMES DAILY. (SEIZURE CONTROL) 90 tablet 11   Cholecalciferol  (VITAMIN D) 50 MCG (2000 UT) tablet Take 4,000 Units by mouth in the morning.     dexamethasone (DECADRON) 4 MG tablet Take 2 tablets (8 mg total) by mouth 2 (two) times daily. Start the day before Taxotere. Then again the day after chemo for 3 days. 30 tablet 1   ibuprofen (ADVIL) 200 MG tablet Take 400 mg by mouth every 8 (eight) hours as needed (pain.).     lidocaine-prilocaine (EMLA) cream Apply to affected area once 30 g 3   lisinopril-hydrochlorothiazide (ZESTORETIC) 20-12.5 MG tablet Take 1 tablet by mouth every morning.     loratadine (CLARITIN) 10 MG tablet Take 1 tablet (10 mg total) by mouth daily. Take Claritin daily for 4 days, Day1-4 of each chemo cycle. 16 tablet 0   metFORMIN (GLUCOPHAGE) 500 MG tablet Take by mouth 2 (two) times daily with a meal.     ondansetron (ZOFRAN) 8 MG tablet Take 1 tablet (8 mg total) by mouth  2 (two) times daily as needed for refractory nausea / vomiting. Start on day 3 after chemo. 30 tablet 1   prochlorperazine (COMPAZINE) 10 MG tablet Take 1 tablet (10 mg total) by mouth every 6 (six) hours as needed (Nausea or vomiting). 30 tablet 1   simvastatin (ZOCOR) 20 MG tablet Take 20 mg by mouth in the morning.     No current facility-administered medications for this visit.     PHYSICAL EXAMINATION: ECOG PERFORMANCE STATUS: 2 - Symptomatic, <50% confined to bed Vitals:   01/29/21 0948  BP: 124/64  Pulse: 86  Resp: 18  Temp: (!) 96.3 F (35.7 C)   Filed Weights   01/29/21 0948  Weight: 187 lb 9.6 oz (85.1 kg)    Physical Exam Constitutional:      General: She is not in acute distress. HENT:     Head: Normocephalic and atraumatic.  Eyes:     General: No scleral icterus. Cardiovascular:     Rate and Rhythm: Normal rate and regular rhythm.     Heart sounds: Normal heart sounds.  Pulmonary:     Effort: Pulmonary effort is normal. No respiratory distress.     Breath sounds: No wheezing.  Abdominal:     General: Bowel sounds are normal.  There is no distension.     Palpations: Abdomen is soft.  Musculoskeletal:        General: No deformity. Normal range of motion.     Cervical back: Normal range of motion and neck supple.  Skin:    General: Skin is warm and dry.     Findings: No erythema or rash.  Neurological:     Mental Status: She is alert and oriented to person, place, and time. Mental status is at baseline.     Cranial Nerves: No cranial nerve deficit.     Coordination: Coordination normal.  Psychiatric:        Mood and Affect: Mood normal.    LABORATORY DATA:  I have reviewed the data as listed Lab Results  Component Value Date   WBC 5.8 01/29/2021   HGB 10.7 (L) 01/29/2021   HCT 32.0 (L) 01/29/2021   MCV 93.0 01/29/2021   PLT 569 (H) 01/29/2021   Recent Labs    01/08/21 0840 01/15/21 0822 01/29/21 0839  NA 136 132* 135  K 3.6 3.6 3.7  CL 100 95* 101  CO2 _0 GLUCOSE 289* 203* 211*  BUN _1 CREATININE 0.77 0.74 0.63  CALCIUM 8.9 8.9 8.7*  GFRNONAA >60 >60 >60  PROT 7.0 6.9 7.3  ALBUMIN 4.0 3.8 3.2*  AST _2 ALT 21 22 39  ALKPHOS 67 81 74  BILITOT 0.3 0.5 0.4    Iron/TIBC/Ferritin/ %Sat No results found for: IRON, TIBC, FERRITIN, IRONPCTSAT    RADIOGRAPHIC STUDIES: I have personally reviewed the radiological images as listed and agreed with the findings in the report. DG Chest Port 1 View  Result Date: 12/31/2020 CLINICAL DATA:  Port placement EXAM: PORTABLE CHEST 1 VIEW COMPARISON:  12/22/2011 FINDINGS: Right-sided central venous port with tip overlying SVC. No pneumothorax. Mild cardiomegaly. Streaky atelectasis or scar at the left lung base. IMPRESSION: 1. Right-sided central venous port tip over the SVC. No visible pneumothorax 2. Cardiomegaly with streaky atelectasis or scar at the left base Electronically Signed   By: Donavan Foil M.D.   On: 12/31/2020 15:34   DG C-Arm 1-60 Min-No Report  Result Date: 12/31/2020 Fluoroscopy was utilized  by the requesting  physician.  No radiographic interpretation.   ECHOCARDIOGRAM COMPLETE  Result Date: 01/01/2021    ECHOCARDIOGRAM REPORT   Patient Name:   Tamara Mejia Date of Exam: 01/01/2021 Medical Rec #:  [id number]      Height:       63.5 in Accession #:    [id number]     Weight:       194.4 lb Date of Birth:  20-Jun-1950       BSA:          1.922 m Patient Age:    59 years       BP:           155/70 mmHg Patient Gender: F              HR:           71 bpm. Exam Location:  [facility] Procedure: 2D Echo, Color Doppler and Cardiac Doppler Indications:     Z51.81, Z79.899-Encounter for monitoring cardiotoxic drug                  therapy.  History:         Patient has no prior history of Echocardiogram examinations.                  Risk Factors:Diabetes and Hypertension. Pt has history of                  Covid-19.  Sonographer:     Sherrie Sport Referring Phys:  [id number] Abdirizak Richison Diagnosing Phys: Kate Sable MD  Sonographer Comments: Technically challenging study due to limited acoustic windows, suboptimal subcostal window and no apical window. IMPRESSIONS  1. Left ventricular ejection fraction, by estimation, is 60 to 65%. The left ventricle has normal function. The left ventricle has no regional wall motion abnormalities. Left ventricular diastolic function could not be evaluated.  2. Right ventricular systolic function is normal. The right ventricular size is normal.  3. The mitral valve is normal in structure. No evidence of mitral valve regurgitation.  4. The aortic valve is tricuspid. Aortic valve regurgitation is not visualized.  5. The inferior vena cava is normal in size with greater than 50% respiratory variability, suggesting right atrial pressure of 3 mmHg. FINDINGS  Left Ventricle: Left ventricular ejection fraction, by estimation, is 60 to 65%. The left ventricle has normal function. The left ventricle has no regional wall motion abnormalities. The left ventricular internal cavity size was normal in size. There is   no left ventricular hypertrophy. Left ventricular diastolic function could not be evaluated. Right Ventricle: The right ventricular size is normal. No increase in right ventricular wall thickness. Right ventricular systolic function is normal. Left Atrium: Left atrial size was normal in size. Right Atrium: Right atrial size was normal in size. Pericardium: There is no evidence of pericardial effusion. Mitral Valve: The mitral valve is normal in structure. No evidence of mitral valve regurgitation. Tricuspid Valve: The tricuspid valve is normal in structure. Tricuspid valve regurgitation is not demonstrated. Aortic Valve: The aortic valve is tricuspid. Aortic valve regurgitation is not visualized. Pulmonic Valve: The pulmonic valve was normal in structure. Pulmonic valve regurgitation is not visualized. Aorta: The aortic root is normal in size and structure. Venous: The inferior vena cava is normal in size with greater than 50% respiratory variability, suggesting right atrial pressure of 3 mmHg. IAS/Shunts: No atrial level shunt detected by color flow Doppler.  LEFT VENTRICLE PLAX 2D LVIDd:  4.10 cm LVIDs:         2.90 cm LV PW:         1.30 cm LV IVS:        1.00 cm LVOT diam:     2.00 cm LVOT Area:     3.14 cm  LEFT ATRIUM         Index LA diam:    3.30 cm 1.72 cm/m                        PULMONIC VALVE AORTA                 PV Vmax:        0.84 m/s Ao Root diam: 2.93 cm PV Vmean:       59.150 cm/s                       PV VTI:         0.138 m                       PV Peak grad:   2.9 mmHg                       PV Mean grad:   1.5 mmHg                       RVOT Peak grad: 4 mmHg   SHUNTS Systemic Diam: 2.00 cm Pulmonic VTI:  0.146 m Kate Sable MD Electronically signed by Kate Sable MD Signature Date/Time: 01/01/2021/6:43:38 PM    Final       ASSESSMENT & PLAN:  1. Encounter for antineoplastic chemotherapy   2. Malignant neoplasm of left breast in female, estrogen receptor positive,  unspecified site of breast ([place])   3. Loss of weight   4. Hyperglycemia    Cancer Staging  Malignant neoplasm of left breast in female, estrogen receptor positive ([place]) Staging form: Breast, AJCC 8th Edition - Pathologic stage from 12/23/2020: Stage IB (pT1c, pN1a, cM0, G3, ER+, PR+, HER2-) - Signed by Earlie Server, MD on 12/23/2020  # Stage IB left breast cancer, ER/PR+, HER2 - pT1c pN1a M0 Mammaprint came back high risk, 29% recurrence risk in 10 years.  Labs are reviewed and discussed with patient. Proceed with cycle 2 docetaxel/Cytoxan.  Patient will receive on pro Neulasta for G-CSF support.-Claritin daily for 4 days.  #Hyperglycemia, patient has diabetes on diet control. Hypoglycemia is likely due to dexamethasone use.  Recommend tighten glycemic control with strict diabetic diet.  Recommend patient to follow-up with primary care provider.  #Loss of weight, refer to nutritionist. Supportive care measures are necessary for patient well-being and will be provided as necessary. All questions are answered.  Orders Placed This Encounter  Procedures   Ambulatory Referral to [facility]    Referral Priority:   Routine    Referral Type:   Consultation    Referral Reason:   Specialty Services Required    Number of Visits Requested:   1     All questions were answered. The patient knows to call the clinic with any problems questions or concerns.  cc Tamara Ruths, MD    Return of visit: 1 3 weeks lab MD cycle 3 TC with Sherrine Maples, MD, PhD 01/29/2021

## 2021-01-29 NOTE — Patient Instructions (Signed)
[facility]  Discharge Instructions: ?Thank you for choosing [facility] to provide your oncology and hematology care.  ?If you have a lab appointment with the Cancer Center, please go directly to the Cancer Center and check in at the registration area. ? ?Wear comfortable clothing and clothing appropriate for easy access to any Portacath or PICC line.  ? ?We strive to give you quality time with your provider. You may need to reschedule your appointment if you arrive late (15 or more minutes).  Arriving late affects you and other patients whose appointments are after yours.  Also, if you miss three or more appointments without notifying the office, you may be dismissed from the clinic at the provider?s discretion.    ?  ?For prescription refill requests, have your pharmacy contact our office and allow 72 hours for refills to be completed.   ? ?  ?To help prevent nausea and vomiting after your treatment, we encourage you to take your nausea medication as directed. ? ?BELOW ARE SYMPTOMS THAT SHOULD BE REPORTED IMMEDIATELY: ?*FEVER GREATER THAN 100.4 F (38 ?C) OR HIGHER ?*CHILLS OR SWEATING ?*NAUSEA AND VOMITING THAT IS NOT CONTROLLED WITH YOUR NAUSEA MEDICATION ?*UNUSUAL SHORTNESS OF BREATH ?*UNUSUAL BRUISING OR BLEEDING ?*URINARY PROBLEMS (pain or burning when urinating, or frequent urination) ?*BOWEL PROBLEMS (unusual diarrhea, constipation, pain near the anus) ?TENDERNESS IN MOUTH AND THROAT WITH OR WITHOUT PRESENCE OF ULCERS (sore throat, sores in mouth, or a toothache) ?UNUSUAL RASH, SWELLING OR PAIN  ?UNUSUAL VAGINAL DISCHARGE OR ITCHING  ? ?Items with * indicate a potential emergency and should be followed up as soon as possible or go to the Emergency Department if any problems should occur. ? ?Please show the CHEMOTHERAPY ALERT CARD or IMMUNOTHERAPY ALERT CARD at check-in to the Emergency Department and triage nurse. ? ?Should you have questions after your visit  or need to cancel or reschedule your appointment, please contact [facility]  [phone] and follow the prompts.  Office hours are 8:00 a.m. to 4:30 p.m. Monday - Friday. Please note that voicemails left after 4:00 p.m. may not be returned until the following business day.  We are closed weekends and major holidays. You have access to a nurse at all times for urgent questions. Please call the main number to the clinic [phone] and follow the prompts. ? ?For any non-urgent questions, you may also contact your provider using MyChart. We now offer e-Visits for anyone 18 and older to request care online for non-urgent symptoms. For details visit [email or web address]. ?  ?Also download the MyChart app! Go to the app store, search "MyChart", open the app, select [place], and log in with your MyChart username and password. ? ?Due to Covid, a mask is required upon entering the hospital/clinic. If you do not have a mask, one will be given to you upon arrival. For doctor visits, patients may have 1 support person aged 18 or older with them. For treatment visits, patients cannot have anyone with them due to current Covid guidelines and our immunocompromised population.  ?

## 2021-01-29 NOTE — Progress Notes (Signed)
Patient here for follow up. Reports she had mild nausea, but resolved with antiemetics.

## 2021-02-14 ENCOUNTER — Telehealth: Payer: Self-pay | Admitting: *Deleted

## 2021-02-14 ENCOUNTER — Inpatient Hospital Stay (HOSPITAL_BASED_OUTPATIENT_CLINIC_OR_DEPARTMENT_OTHER): Payer: Medicare Other | Admitting: Hospice and Palliative Medicine

## 2021-02-14 ENCOUNTER — Emergency Department: Payer: Medicare Other

## 2021-02-14 ENCOUNTER — Inpatient Hospital Stay
Admission: EM | Admit: 2021-02-14 | Discharge: 2021-02-28 | DRG: 270 | Disposition: A | Discharge status: Skilled Nursing Facility | Payer: Medicare Other | Attending: Internal Medicine | Admitting: Internal Medicine

## 2021-02-14 ENCOUNTER — Encounter: Payer: Self-pay | Admitting: Emergency Medicine

## 2021-02-14 ENCOUNTER — Other Ambulatory Visit: Payer: Self-pay

## 2021-02-14 VITALS — BP 87/67 | HR 107 | Temp 97.3°F | Resp 18 | Wt 181.0 lb

## 2021-02-14 DIAGNOSIS — E1169 Type 2 diabetes mellitus with other specified complication: Secondary | ICD-10-CM | POA: Diagnosis present

## 2021-02-14 DIAGNOSIS — R8290 Unspecified abnormal findings in urine: Secondary | ICD-10-CM | POA: Diagnosis present

## 2021-02-14 DIAGNOSIS — A419 Sepsis, unspecified organism: Secondary | ICD-10-CM | POA: Diagnosis present

## 2021-02-14 DIAGNOSIS — N3281 Overactive bladder: Secondary | ICD-10-CM | POA: Diagnosis present

## 2021-02-14 DIAGNOSIS — R569 Unspecified convulsions: Secondary | ICD-10-CM

## 2021-02-14 DIAGNOSIS — T80211A Bloodstream infection due to central venous catheter, initial encounter: Principal | ICD-10-CM | POA: Diagnosis present

## 2021-02-14 DIAGNOSIS — E871 Hypo-osmolality and hyponatremia: Secondary | ICD-10-CM | POA: Diagnosis present

## 2021-02-14 DIAGNOSIS — F819 Developmental disorder of scholastic skills, unspecified: Secondary | ICD-10-CM | POA: Diagnosis present

## 2021-02-14 DIAGNOSIS — G40909 Epilepsy, unspecified, not intractable, without status epilepticus: Secondary | ICD-10-CM | POA: Diagnosis present

## 2021-02-14 DIAGNOSIS — R7881 Bacteremia: Secondary | ICD-10-CM | POA: Diagnosis present

## 2021-02-14 DIAGNOSIS — F32A Depression, unspecified: Secondary | ICD-10-CM | POA: Diagnosis present

## 2021-02-14 DIAGNOSIS — C50912 Malignant neoplasm of unspecified site of left female breast: Secondary | ICD-10-CM

## 2021-02-14 DIAGNOSIS — Z79899 Other long term (current) drug therapy: Secondary | ICD-10-CM

## 2021-02-14 DIAGNOSIS — D649 Anemia, unspecified: Secondary | ICD-10-CM | POA: Diagnosis not present

## 2021-02-14 DIAGNOSIS — Z6835 Body mass index (BMI) 35.0-35.9, adult: Secondary | ICD-10-CM

## 2021-02-14 DIAGNOSIS — Z170 Estrogen receptor positive status [ER+]: Secondary | ICD-10-CM

## 2021-02-14 DIAGNOSIS — Z90710 Acquired absence of both cervix and uterus: Secondary | ICD-10-CM

## 2021-02-14 DIAGNOSIS — Z8616 Personal history of COVID-19: Secondary | ICD-10-CM

## 2021-02-14 DIAGNOSIS — Z803 Family history of malignant neoplasm of breast: Secondary | ICD-10-CM

## 2021-02-14 DIAGNOSIS — Z9049 Acquired absence of other specified parts of digestive tract: Secondary | ICD-10-CM

## 2021-02-14 DIAGNOSIS — R000 Tachycardia, unspecified: Secondary | ICD-10-CM | POA: Diagnosis present

## 2021-02-14 DIAGNOSIS — Z5111 Encounter for antineoplastic chemotherapy: Secondary | ICD-10-CM | POA: Diagnosis not present

## 2021-02-14 DIAGNOSIS — J189 Pneumonia, unspecified organism: Secondary | ICD-10-CM | POA: Diagnosis present

## 2021-02-14 DIAGNOSIS — A4101 Sepsis due to Methicillin susceptible Staphylococcus aureus: Secondary | ICD-10-CM | POA: Diagnosis present

## 2021-02-14 DIAGNOSIS — E119 Type 2 diabetes mellitus without complications: Secondary | ICD-10-CM

## 2021-02-14 DIAGNOSIS — Z8249 Family history of ischemic heart disease and other diseases of the circulatory system: Secondary | ICD-10-CM

## 2021-02-14 DIAGNOSIS — Z7982 Long term (current) use of aspirin: Secondary | ICD-10-CM

## 2021-02-14 DIAGNOSIS — M25559 Pain in unspecified hip: Secondary | ICD-10-CM | POA: Diagnosis present

## 2021-02-14 DIAGNOSIS — G3184 Mild cognitive impairment, so stated: Secondary | ICD-10-CM | POA: Diagnosis present

## 2021-02-14 DIAGNOSIS — E7800 Pure hypercholesterolemia, unspecified: Secondary | ICD-10-CM | POA: Diagnosis present

## 2021-02-14 DIAGNOSIS — Z7984 Long term (current) use of oral hypoglycemic drugs: Secondary | ICD-10-CM

## 2021-02-14 DIAGNOSIS — E785 Hyperlipidemia, unspecified: Secondary | ICD-10-CM

## 2021-02-14 DIAGNOSIS — E876 Hypokalemia: Secondary | ICD-10-CM | POA: Diagnosis present

## 2021-02-14 DIAGNOSIS — M4620 Osteomyelitis of vertebra, site unspecified: Secondary | ICD-10-CM

## 2021-02-14 DIAGNOSIS — D72829 Elevated white blood cell count, unspecified: Secondary | ICD-10-CM | POA: Diagnosis present

## 2021-02-14 DIAGNOSIS — Y848 Other medical procedures as the cause of abnormal reaction of the patient, or of later complication, without mention of misadventure at the time of the procedure: Secondary | ICD-10-CM | POA: Diagnosis present

## 2021-02-14 DIAGNOSIS — M25551 Pain in right hip: Secondary | ICD-10-CM | POA: Diagnosis not present

## 2021-02-14 DIAGNOSIS — Z9012 Acquired absence of left breast and nipple: Secondary | ICD-10-CM

## 2021-02-14 DIAGNOSIS — Z20822 Contact with and (suspected) exposure to covid-19: Secondary | ICD-10-CM | POA: Diagnosis present

## 2021-02-14 DIAGNOSIS — E8809 Other disorders of plasma-protein metabolism, not elsewhere classified: Secondary | ICD-10-CM | POA: Diagnosis present

## 2021-02-14 DIAGNOSIS — I10 Essential (primary) hypertension: Secondary | ICD-10-CM | POA: Diagnosis present

## 2021-02-14 DIAGNOSIS — Z593 Problems related to living in residential institution: Secondary | ICD-10-CM

## 2021-02-14 DIAGNOSIS — Z833 Family history of diabetes mellitus: Secondary | ICD-10-CM

## 2021-02-14 DIAGNOSIS — F70 Mild intellectual disabilities: Secondary | ICD-10-CM | POA: Diagnosis present

## 2021-02-14 DIAGNOSIS — B9561 Methicillin susceptible Staphylococcus aureus infection as the cause of diseases classified elsewhere: Secondary | ICD-10-CM | POA: Diagnosis present

## 2021-02-14 DIAGNOSIS — G061 Intraspinal abscess and granuloma: Secondary | ICD-10-CM | POA: Diagnosis present

## 2021-02-14 DIAGNOSIS — D638 Anemia in other chronic diseases classified elsewhere: Secondary | ICD-10-CM | POA: Diagnosis present

## 2021-02-14 LAB — CBC
HCT: 27.5 % — ABNORMAL LOW (ref 36.0–46.0)
Hemoglobin: 9.2 g/dL — ABNORMAL LOW (ref 12.0–15.0)
MCH: 30.1 pg (ref 26.0–34.0)
MCHC: 33.5 g/dL (ref 30.0–36.0)
MCV: 89.9 fL (ref 80.0–100.0)
Platelets: 328 10*3/uL (ref 150–400)
RBC: 3.06 MIL/uL — ABNORMAL LOW (ref 3.87–5.11)
RDW: 13.8 % (ref 11.5–15.5)
WBC: 16.5 10*3/uL — ABNORMAL HIGH (ref 4.0–10.5)
nRBC: 0 % (ref 0.0–0.2)

## 2021-02-14 LAB — GLUCOSE, CAPILLARY: Glucose-Capillary: 266 mg/dL — ABNORMAL HIGH (ref 70–99)

## 2021-02-14 LAB — BASIC METABOLIC PANEL
Anion gap: 12 (ref 5–15)
BUN: 15 mg/dL (ref 8–23)
CO2: 25 mmol/L (ref 22–32)
Calcium: 8 mg/dL — ABNORMAL LOW (ref 8.9–10.3)
Chloride: 86 mmol/L — ABNORMAL LOW (ref 98–111)
Creatinine, Ser: 0.81 mg/dL (ref 0.44–1.00)
GFR, Estimated: >60 mL/min (ref >60)
Glucose, Bld: 285 mg/dL — ABNORMAL HIGH (ref 70–99)
Potassium: 3.4 mmol/L — ABNORMAL LOW (ref 3.5–5.1)
Sodium: 123 mmol/L — ABNORMAL LOW (ref 135–145)

## 2021-02-14 LAB — PROCALCITONIN: Procalcitonin: 0.25 ng/mL

## 2021-02-14 LAB — HEPATIC FUNCTION PANEL
ALT: 67 U/L — ABNORMAL HIGH (ref 0–44)
AST: 31 U/L (ref 15–41)
Albumin: 2.4 g/dL — ABNORMAL LOW (ref 3.5–5.0)
Alkaline Phosphatase: 67 U/L (ref 38–126)
Bilirubin, Direct: 0.2 mg/dL (ref 0.0–0.2)
Indirect Bilirubin: 0.4 mg/dL (ref 0.3–0.9)
Total Bilirubin: 0.6 mg/dL (ref 0.3–1.2)
Total Protein: 6.5 g/dL (ref 6.5–8.1)

## 2021-02-14 LAB — MAGNESIUM: Magnesium: 1.5 mg/dL — ABNORMAL LOW (ref 1.7–2.4)

## 2021-02-14 LAB — PHOSPHORUS: Phosphorus: 3.1 mg/dL (ref 2.5–4.6)

## 2021-02-14 LAB — LACTIC ACID, PLASMA: Lactic Acid, Venous: 1.5 mmol/L (ref 0.5–1.9)

## 2021-02-14 LAB — RESP PANEL BY RT-PCR (FLU A&B, COVID) ARPGX2
Influenza A by PCR: NEGATIVE
Influenza B by PCR: NEGATIVE
SARS Coronavirus 2 by RT PCR: NEGATIVE

## 2021-02-14 MED ORDER — KETOROLAC TROMETHAMINE 15 MG/ML IJ SOLN: 15.0000 mg | Freq: Four times a day (QID) | INTRAMUSCULAR | Status: DC | PRN

## 2021-02-14 MED ORDER — CARBAMAZEPINE 200 MG PO TABS
200.0000 mg | ORAL_TABLET | Freq: Three times a day (TID) | ORAL | Status: DC
  Administered 2021-02-14 – 2021-02-28 (×40): 200 mg via ORAL
  Filled 2021-02-14 (×40): qty 1

## 2021-02-14 MED ORDER — MAGNESIUM SULFATE 2 GM/50ML IV SOLN
2.0000 g | Freq: Once | INTRAVENOUS | Status: AC
  Administered 2021-02-15: 2 g via INTRAVENOUS
  Filled 2021-02-14: qty 50

## 2021-02-14 MED ORDER — LISINOPRIL 20 MG PO TABS
20.0000 mg | ORAL_TABLET | Freq: Every day | ORAL | Status: DC
  Administered 2021-02-15 – 2021-02-18 (×4): 20 mg via ORAL
  Filled 2021-02-14 (×4): qty 1

## 2021-02-14 MED ORDER — ONDANSETRON HCL 4 MG PO TABS: 4.0000 mg | ORAL_TABLET | Freq: Four times a day (QID) | ORAL | Status: DC | PRN

## 2021-02-14 MED ORDER — IBUPROFEN 400 MG PO TABS: 400.0000 mg | ORAL_TABLET | Freq: Three times a day (TID) | ORAL | Status: DC | PRN

## 2021-02-14 MED ORDER — ENOXAPARIN SODIUM 40 MG/0.4ML IJ SOSY
40.0000 mg | PREFILLED_SYRINGE | INTRAMUSCULAR | Status: DC
  Administered 2021-02-14 – 2021-02-27 (×14): 40 mg via SUBCUTANEOUS
  Filled 2021-02-14 (×14): qty 0.4

## 2021-02-14 MED ORDER — LISINOPRIL-HYDROCHLOROTHIAZIDE 20-12.5 MG PO TABS: 1.0000 | ORAL_TABLET | ORAL | Status: DC

## 2021-02-14 MED ORDER — SODIUM CHLORIDE 0.9 % IV SOLN: INTRAVENOUS | Status: DC

## 2021-02-14 MED ORDER — INSULIN ASPART 100 UNIT/ML IJ SOLN
0.0000 [IU] | Freq: Three times a day (TID) | INTRAMUSCULAR | Status: DC
  Administered 2021-02-15 (×3): 4 [IU] via SUBCUTANEOUS
  Administered 2021-02-16: 7 [IU] via SUBCUTANEOUS
  Administered 2021-02-16 – 2021-02-17 (×4): 4 [IU] via SUBCUTANEOUS
  Administered 2021-02-17: 12:00:00 11 [IU] via SUBCUTANEOUS
  Administered 2021-02-18 – 2021-02-20 (×6): 4 [IU] via SUBCUTANEOUS
  Administered 2021-02-21: 3 [IU] via SUBCUTANEOUS
  Administered 2021-02-21 – 2021-02-22 (×5): 4 [IU] via SUBCUTANEOUS
  Administered 2021-02-23: 11 [IU] via SUBCUTANEOUS
  Administered 2021-02-23 (×2): 4 [IU] via SUBCUTANEOUS
  Administered 2021-02-24: 7 [IU] via SUBCUTANEOUS
  Administered 2021-02-24: 4 [IU] via SUBCUTANEOUS
  Administered 2021-02-24: 11 [IU] via SUBCUTANEOUS
  Administered 2021-02-25: 7 [IU] via SUBCUTANEOUS
  Administered 2021-02-25: 11 [IU] via SUBCUTANEOUS
  Administered 2021-02-25: 3 [IU] via SUBCUTANEOUS
  Administered 2021-02-26: 4 [IU] via SUBCUTANEOUS
  Administered 2021-02-26: 7 [IU] via SUBCUTANEOUS
  Administered 2021-02-26 – 2021-02-27 (×3): 4 [IU] via SUBCUTANEOUS
  Administered 2021-02-27 – 2021-02-28 (×2): 3 [IU] via SUBCUTANEOUS
  Administered 2021-02-28: 7 [IU] via SUBCUTANEOUS
  Filled 2021-02-14 (×39): qty 1

## 2021-02-14 MED ORDER — ACETAMINOPHEN 650 MG RE SUPP: 650.0000 mg | Freq: Four times a day (QID) | RECTAL | Status: AC | PRN

## 2021-02-14 MED ORDER — METOPROLOL TARTRATE 5 MG/5ML IV SOLN
2.5000 mg | INTRAVENOUS | Status: DC | PRN
  Administered 2021-02-15 – 2021-02-16 (×2): 2.5 mg via INTRAVENOUS
  Filled 2021-02-14 (×3): qty 5

## 2021-02-14 MED ORDER — CALCIUM CARBONATE ANTACID 500 MG PO CHEW
1.0000 | CHEWABLE_TABLET | Freq: Every day | ORAL | Status: AC
  Administered 2021-02-15 – 2021-02-16 (×2): 200 mg via ORAL
  Filled 2021-02-14 (×2): qty 1

## 2021-02-14 MED ORDER — HYDROCHLOROTHIAZIDE 12.5 MG PO TABS
12.5000 mg | ORAL_TABLET | Freq: Every day | ORAL | Status: DC
  Administered 2021-02-15: 12.5 mg via ORAL
  Filled 2021-02-14: qty 1

## 2021-02-14 MED ORDER — SODIUM CHLORIDE 0.9 % IV SOLN
2.0000 g | INTRAVENOUS | Status: DC
  Administered 2021-02-15: 2 g via INTRAVENOUS
  Filled 2021-02-14: qty 2
  Filled 2021-02-14: qty 20

## 2021-02-14 MED ORDER — METFORMIN HCL 500 MG PO TABS
500.0000 mg | ORAL_TABLET | Freq: Two times a day (BID) | ORAL | Status: DC
  Administered 2021-02-15 (×2): 500 mg via ORAL
  Filled 2021-02-14 (×2): qty 1

## 2021-02-14 MED ORDER — INSULIN ASPART 100 UNIT/ML IJ SOLN
0.0000 [IU] | Freq: Every day | INTRAMUSCULAR | Status: DC
  Administered 2021-02-15: 3 [IU] via SUBCUTANEOUS
  Administered 2021-02-16 – 2021-02-23 (×2): 2 [IU] via SUBCUTANEOUS
  Filled 2021-02-14 (×3): qty 1

## 2021-02-14 MED ORDER — POTASSIUM CHLORIDE CRYS ER 20 MEQ PO TBCR
20.0000 meq | EXTENDED_RELEASE_TABLET | Freq: Once | ORAL | Status: AC
  Administered 2021-02-15: 20 meq via ORAL
  Filled 2021-02-14: qty 1

## 2021-02-14 MED ORDER — LORAZEPAM 2 MG/ML IJ SOLN: 2.0000 mg | INTRAMUSCULAR | Status: DC | PRN

## 2021-02-14 MED ORDER — ACETAMINOPHEN 325 MG PO TABS
650.0000 mg | ORAL_TABLET | Freq: Four times a day (QID) | ORAL | Status: AC | PRN
  Administered 2021-02-15 – 2021-02-17 (×5): 650 mg via ORAL
  Filled 2021-02-14 (×6): qty 2

## 2021-02-14 MED ORDER — ASPIRIN EC 81 MG PO TBEC
81.0000 mg | DELAYED_RELEASE_TABLET | Freq: Every day | ORAL | Status: DC
  Administered 2021-02-15 – 2021-02-28 (×13): 81 mg via ORAL
  Filled 2021-02-14 (×13): qty 1

## 2021-02-14 MED ORDER — ONDANSETRON HCL 4 MG/2ML IJ SOLN: 4.0000 mg | Freq: Four times a day (QID) | INTRAMUSCULAR | Status: DC | PRN

## 2021-02-14 MED ORDER — AMLODIPINE BESYLATE 5 MG PO TABS
5.0000 mg | ORAL_TABLET | Freq: Every day | ORAL | Status: DC
  Administered 2021-02-15 – 2021-02-28 (×13): 5 mg via ORAL
  Filled 2021-02-14 (×13): qty 1

## 2021-02-14 MED ORDER — HYDROCODONE-ACETAMINOPHEN 10-325 MG PO TABS
1.0000 | ORAL_TABLET | Freq: Four times a day (QID) | ORAL | Status: AC | PRN
  Administered 2021-02-14 – 2021-02-15 (×4): 1 via ORAL
  Filled 2021-02-14 (×4): qty 1

## 2021-02-14 MED ORDER — SIMVASTATIN 20 MG PO TABS
20.0000 mg | ORAL_TABLET | Freq: Every evening | ORAL | Status: DC
  Administered 2021-02-15 – 2021-02-27 (×13): 20 mg via ORAL
  Filled 2021-02-14 (×7): qty 1
  Filled 2021-02-14: qty 2
  Filled 2021-02-14 (×2): qty 1
  Filled 2021-02-14 (×2): qty 2
  Filled 2021-02-14: qty 1

## 2021-02-14 MED ORDER — SODIUM CHLORIDE 0.9 % IV SOLN
500.0000 mg | INTRAVENOUS | Status: DC
  Administered 2021-02-15: 500 mg via INTRAVENOUS
  Filled 2021-02-14: qty 500
  Filled 2021-02-14: qty 5

## 2021-02-14 NOTE — Progress Notes (Signed)
Symptom Management [place] at [facility] [phone]) [phone] Fax:(336) [phone]  Patient Care Team: Kirk Ruths, MD as PCP - General (Internal Medicine) Theodore Demark, RN as Oncology Nurse Navigator   Name of the patient: Tamara Mejia  [id number]  01-01-1951   Date of visit: 02/14/21  Reason for Consult:  Tamara Mejia is a 71 year old woman with multiple medical problems including stage Ib left breast cancer on docetaxel/Cytoxan.  She saw Dr. Tasia Catchings on 01/29/2021 at which time she was doing reasonably well and felt to be tolerating treatments.  She was referred to nutritionist for weight loss.  She received cycle 2 TC chemotherapy and Neulasta on 01/30/2021.  Patient presents to [facility] today for evaluation of right hip pain radiating down the leg.  Patient is a resident at a group home with underlying baseline cognitive/developmental disorder and is a poor historian.  She is accompanied today by her caregiver.  Patient reports a fall earlier this week but details are unclear.  She thinks that her hip pain predated the fall.  She is able to stand and bear weight but ambulation is painful.  She endorses right lower extremity edema.  No calf pain.  No shortness of breath or chest pain.  Denies any neurologic complaints. Denies recent fevers or illnesses. Denies any easy bleeding or bruising. Reports fair appetite and denies weight loss. Denies chest pain. Denies any nausea, vomiting, constipation, or diarrhea. Denies urinary complaints. Patient offers no further specific complaints today.  PAST MEDICAL HISTORY: Past Medical History:  Diagnosis Date   Breast cancer ([place])    Cognitive dysfunction    mental retardation (mild to moderate)   Diabetes mellitus, type 2 (HCC)    diet controlled   Family history of breast cancer    High cholesterol    History of 2019 novel coronavirus disease (COVID-19) 11/04/2020   a.) resides in group home; others were  sick with SARS-Cov-2. Home test (+).   Hypertension    Meningitis    in childhood   Obesity (BMI 30-39.9)    Overactive bladder    Seizure ([facility])    last seizure 2000    PAST SURGICAL HISTORY:  Past Surgical History:  Procedure Laterality Date   ABDOMINAL HYSTERECTOMY     as a teenager   BREAST BIOPSY Right    neg   BREAST BIOPSY Left 10/18/2020   u/s breast  biopsy, "venus" clip-INVASIVE MAMMARY CARCINOMA   CHOLECYSTECTOMY     COLONOSCOPY WITH PROPOFOL N/A 08/27/2015   Procedure: COLONOSCOPY WITH PROPOFOL;  Surgeon: Lucilla Lame, MD;  Location: [facility] ENDOSCOPY;  Service: Endoscopy;  Laterality: N/A;   PART MASTECTOMY,RADIO FREQUENCY LOCALIZER,AXILLARY SENTINEL NODE BIOPSY Left 11/22/2020   Procedure: PART MASTECTOMY, RADIO FREQUENCY LOCALIZER,AXILLARY SENTINEL NODE BIOPSY;  Surgeon: Herbert Pun, MD;  Location: [facility] ORS;  Service: General;  Laterality: Left;   PORTACATH PLACEMENT N/A 12/31/2020   Procedure: INSERTION PORT-A-CATH;  Surgeon: Herbert Pun, MD;  Location: [facility] ORS;  Service: General;  Laterality: N/A;    HEMATOLOGY/ONCOLOGY HISTORY:  Oncology History  Malignant neoplasm of left breast in female, estrogen receptor positive ([place])  10/28/2020 Initial Diagnosis   Breast cancer ([place])   12/23/2020 Cancer Staging   Staging form: Breast, AJCC 8th Edition - Pathologic stage from 12/23/2020: Stage IB (pT1c, pN1a, cM0, G3, ER+, PR+, HER2-) - Signed by Earlie Server, MD on 12/23/2020 Stage prefix: Initial diagnosis Multigene prognostic tests performed: MammaPrint Histologic grading system: 3 grade system    01/08/2021 -  Chemotherapy   Patient is on Treatment Plan : BREAST TC q21d      Genetic Testing   Negative genetic testing. No pathogenic variants identified on the [facility] Common Hereditary Cancers+RNA panel. The report date is 12/28/2020.  The Common Hereditary Cancers Panel + RNA offered by [facility] includes sequencing and/or deletion duplication testing  of the following 47 genes: APC, ATM, AXIN2, BARD1, BMPR1A, BRCA1, BRCA2, BRIP1, CDH1, CDKN2A (p14ARF), CDKN2A (p16INK4a), CKD4, CHEK2, CTNNA1, DICER1, EPCAM (Deletion/duplication testing only), GREM1 (promoter region deletion/duplication testing only), KIT, MEN1, MLH1, MSH2, MSH3, MSH6, MUTYH, NBN, NF1, NHTL1, PALB2, PDGFRA, PMS2, POLD1, POLE, PTEN, RAD50, RAD51C, RAD51D, SDHB, SDHC, SDHD, SMAD4, SMARCA4. STK11, TP53, TSC1, TSC2, and VHL.  The following genes were evaluated for sequence changes only: SDHA and HOXB13 c.251G>A variant only.     ALLERGIES:  has No Known Allergies.  MEDICATIONS:  Current Outpatient Medications  Medication Sig Dispense Refill   acetaminophen (TYLENOL) 500 MG tablet Take 1,000 mg by mouth every 8 (eight) hours as needed (pain.).     amLODipine (NORVASC) 5 MG tablet Take 5 mg by mouth in the morning.     aspirin 81 MG EC tablet Take 81 mg by mouth in the morning. Swallow whole.     carbamazepine (TEGRETOL) 200 MG tablet TAKE ONE TABLET BY MOUTH THREE TIMES DAILY. (SEIZURE CONTROL) 90 tablet 11   Cholecalciferol (VITAMIN D) 50 MCG (2000 UT) tablet Take 4,000 Units by mouth in the morning.     dexamethasone (DECADRON) 4 MG tablet Take 2 tablets (8 mg total) by mouth 2 (two) times daily. Start the day before Taxotere. Then again the day after chemo for 3 days. 30 tablet 1   ibuprofen (ADVIL) 200 MG tablet Take 400 mg by mouth every 8 (eight) hours as needed (pain.).     lidocaine-prilocaine (EMLA) cream Apply to affected area once 30 g 3   lisinopril-hydrochlorothiazide (ZESTORETIC) 20-12.5 MG tablet Take 1 tablet by mouth every morning.     loratadine (CLARITIN) 10 MG tablet Take 1 tablet (10 mg total) by mouth daily. Take Claritin daily for 4 days, Day1-4 of each chemo cycle. 16 tablet 0   metFORMIN (GLUCOPHAGE) 500 MG tablet Take by mouth 2 (two) times daily with a meal.     ondansetron (ZOFRAN) 8 MG tablet Take 1 tablet (8 mg total) by mouth 2 (two) times daily as  needed for refractory nausea / vomiting. Start on day 3 after chemo. 30 tablet 1   prochlorperazine (COMPAZINE) 10 MG tablet Take 1 tablet (10 mg total) by mouth every 6 (six) hours as needed (Nausea or vomiting). 30 tablet 1   simvastatin (ZOCOR) 20 MG tablet Take 20 mg by mouth in the morning.     No current facility-administered medications for this visit.    VITAL SIGNS: There were no vitals taken for this visit. There were no vitals filed for this visit.  Estimated body mass index is 33.23 kg/m as calculated from the following:   Height as of 01/02/21: '5\' 3"'  (1.6 m).   Weight as of 01/29/21: 187 lb 9.6 oz (85.1 kg).  LABS: CBC:    Component Value Date/Time   WBC 5.8 01/29/2021 0839   HGB 10.7 (L) 01/29/2021 0839   HGB 12.8 01/02/2021 1013   HCT 32.0 (L) 01/29/2021 0839   HCT 38.6 01/02/2021 1013   PLT 569 (H) 01/29/2021 0839   PLT 221 01/02/2021 1013   MCV 93.0 01/29/2021 0839   MCV 92 01/02/2021 1013  MCV 91 12/22/2011 2245   NEUTROABS 3.7 01/29/2021 0839   NEUTROABS 2.7 01/02/2021 1013   LYMPHSABS 1.3 01/29/2021 0839   LYMPHSABS 1.4 01/02/2021 1013   MONOABS 0.6 01/29/2021 0839   EOSABS 0.1 01/29/2021 0839   EOSABS 0.3 01/02/2021 1013   BASOSABS 0.1 01/29/2021 0839   BASOSABS 0.1 01/02/2021 1013   Comprehensive Metabolic Panel:    Component Value Date/Time   NA 135 01/29/2021 0839   NA 142 01/02/2021 1013   NA 139 12/22/2011 2245   K 3.7 01/29/2021 0839   K 3.8 12/22/2011 2245   CL 101 01/29/2021 0839   CL 104 12/22/2011 2245   CO2 25 01/29/2021 0839   CO2 29 12/22/2011 2245   BUN 16 01/29/2021 0839   BUN 10 01/02/2021 1013   BUN 18 12/22/2011 2245   CREATININE 0.63 01/29/2021 0839   CREATININE 0.81 12/22/2011 2245   GLUCOSE 211 (H) 01/29/2021 0839   GLUCOSE 100 (H) 12/22/2011 2245   CALCIUM 8.7 (L) 01/29/2021 0839   CALCIUM 9.1 12/22/2011 2245   AST 29 01/29/2021 0839   AST 34 12/22/2011 2245   ALT 39 01/29/2021 0839   ALT 33 12/22/2011 2245    ALKPHOS 74 01/29/2021 0839   ALKPHOS 83 12/22/2011 2245   BILITOT 0.4 01/29/2021 0839   BILITOT <0.2 01/02/2021 1013   BILITOT 0.2 12/22/2011 2245   PROT 7.3 01/29/2021 0839   PROT 6.8 01/02/2021 1013   PROT 7.6 12/22/2011 2245   ALBUMIN 3.2 (L) 01/29/2021 0839   ALBUMIN 4.5 01/02/2021 1013   ALBUMIN 3.8 12/22/2011 2245    RADIOGRAPHIC STUDIES: No results found.  PERFORMANCE STATUS (ECOG) : 3 - Symptomatic, >50% confined to bed  Review of Systems Unless otherwise noted, a complete review of systems is negative.  Physical Exam General: NAD Cardiovascular: regular rate and rhythm Pulmonary: clear ant fields Abdomen: soft, nontender, + bowel sounds GU: no suprapubic tenderness Extremities: Trace right lower extremity edema, ROM intact, patient able to bear weight but is unsteady on feet.  She has right hip pain with movement/rotation Skin: no rashes Neurological: Weakness but otherwise nonfocal  Assessment and Plan- Patient is a 71 y.o. female with multiple medical problems including stage Ib left breast cancer on docetaxel/Cytoxan.  She saw Dr. Tasia Catchings on 01/29/2021 at which time she was doing reasonably well and felt to be tolerating treatments.  She was referred to nutritionist for weight loss.  She received cycle 2 TC chemotherapy and Neulasta on 01/30/2021.   Right hip pain -patient will need imaging to rule out fracture given recent fall.  Discussed with Dr. Tasia Catchings and will refer patient to ED for further evaluation.  Patient transported to ED and report given to triage   Patient expressed understanding and was in agreement with this plan. She also understands that She can call clinic at any time with any questions, concerns, or complaints.   Thank you for allowing me to participate in the care of this very pleasant patient.   Time Total: 20 minutes  Visit consisted of counseling and education dealing with the complex and emotionally intense issues of symptom management in the  setting of serious illness.Greater than 50%  of this time was spent counseling and coordinating care related to the above assessment and plan.  Signed by: Altha Harm, PhD, NP-C

## 2021-02-14 NOTE — Progress Notes (Signed)
Patient's caregiver Roderic Ovens phoned with patient complaint of right hip pain radiating down leg. Patient scheduled for symptom managment clinic today.

## 2021-02-14 NOTE — Progress Notes (Signed)
Pt arrives with caregiver with c/o right hip pain radiating down right leg. Pt is able to bear weight. No pain with abduction and worse with lifting of the leg. Swelling noted entire right leg. Pt reports that she slid out of her chair Wednesday(1/18). Caregiver states that pt c/o intermittent pain prior to incident but worsening/constant pain noted on Friday(1/20).

## 2021-02-14 NOTE — Assessment & Plan Note (Signed)
-   Resumed metformin 500 mg p.o. twice daily - Insulin SSI with at bedtime coverage ordered

## 2021-02-14 NOTE — Hospital Course (Signed)
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  Vitals in the emergency department showed temperature of 97.3, heart rate of 107, respiration rate of 18, initial blood pressure of 87/67, improved to 133/86, SPO2 of 98% on room air.  Serum sodium 123, potassium 3.4, chloride of 86, bicarb 25, BUN of 15, serum creatinine of 0.81, nonfasting blood glucose 285, GFR greater than 60, WBC was elevated at 16.5, hemoglobin 9.2, platelets of 328.  No medical treatment was initiated in the emergency department.

## 2021-02-14 NOTE — Assessment & Plan Note (Signed)
-   Amlodipine 5 mg daily with breakfast, hydrochlorothiazide 12.5 mg daily, lisinopril 20 mg daily

## 2021-02-14 NOTE — Assessment & Plan Note (Addendum)
-   Etiology work-up in progress - Check blood cultures x2, procalcitonin, UA, Legionella urine antigen - CBC in a.m.

## 2021-02-14 NOTE — Telephone Encounter (Signed)
RN called and spoke with caregiver.  Appointment for [facility] given for 1 pm with Billey Chang NP

## 2021-02-14 NOTE — Progress Notes (Deleted)
Patient has lost weight due to chemo treatment

## 2021-02-14 NOTE — Assessment & Plan Note (Signed)
-   Replace with magnesium 2 grams IV - Check magnesium level in the a.m.

## 2021-02-14 NOTE — Assessment & Plan Note (Signed)
-   Resume simvastatin 20 mg nightly

## 2021-02-14 NOTE — Assessment & Plan Note (Signed)
-   Replace with potassium chloride p.o.

## 2021-02-14 NOTE — Assessment & Plan Note (Signed)
-   Moderate to severe, etiology work-up in progress - Corrected serum sodium with glucose is 127, [place], 1999 - Check urine osmolality and urine sodium - BMP in the a.m.

## 2021-02-14 NOTE — ED Triage Notes (Signed)
Pt via POV from [place]. Pt c/o R hip pain since Saturday. Denies falls. Pt has been able to walk since. Denies pain anywhere else. Staff also reports increased weakness.  Pt is a CA pt and receiving chemo therapy for breast cancer.   Pt is alert and oriented

## 2021-02-14 NOTE — H&P (Addendum)
History and Physical   Tamara Mejia:[id number] DOB: 1950-06-18 DOA: 02/14/2021  PCP: Kirk Ruths, MD  Outpatient Specialists: Dr. Tasia Catchings, medical oncology Patient coming from: Group home   I have personally briefly reviewed patient's old medical records in [place].  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  Vitals in the emergency department showed temperature of 97.3, heart rate of 107, respiration rate of 18, initial blood pressure of 87/67, improved to 133/86, SPO2 of 98% on room air.  Serum sodium 123, potassium 3.4, chloride of 86, bicarb 25, BUN of 15, serum creatinine of 0.81, nonfasting blood glucose 285, GFR greater than 60, WBC was elevated at 16.5, hemoglobin 9.2, platelets of 328.  No medical treatment was initiated in the emergency department.  At bedside, she is able to tell me her name, age, current location of hospital and current calender year of 29.  She is able to tell me that her caregiver is at bedside.  She states her leg started hurting on Friday.   Caregiver denies known sick contacts, cough.  She denies new cough, chest pain, nausea, vomiting, abdominal pain, burning or pain when she pees, diarrhea.  Caregiver did endorse that patient had slid down her chair onto her right side a couple of days ago.  However caregiver states that her leg was hurting prior to that.  Social history: She lives at a group home.  ROS: Unable to fully complete as patient has learning disability/cognitive dysfunction.  ED Course: Discussed with  emergency medicine provider, patient requiring hospitalization for chief concerns of hyponatremia.  Assessment/Plan  Principal Problem:   Hyponatremia Active Problems:   Leukocytosis   DMII (diabetes mellitus, type 2) (HCC)   HTN (hypertension), benign   Hyperlipidemia   OAB (overactive bladder)   Seizure (HCC)   Severe obesity (BMI 35.0-35.9 with comorbidity) (HCC)   Hypokalemia   Hypocalcemia   Hypomagnesemia   Malignant neoplasm of left breast in female, estrogen receptor positive (HCC)   Hip pain   Pneumonia   Systemic inflammatory response syndrome (SIRS) (HCC)    Cardiovascular and Mediastinum HTN (hypertension), benign Assessment & Plan - Amlodipine 5 mg daily with breakfast, hydrochlorothiazide 12.5 mg daily, lisinopril 20 mg daily  Respiratory Pneumonia Assessment & Plan - Community acquired pneumonia - Ordered azithromycin and ceftriaxone IV, for 5 days   Endocrine DMII (diabetes mellitus, type 2) (HCC) Assessment & Plan - Resumed metformin 500 mg p.o. twice daily - Insulin SSI with at bedtime coverage ordered  Other Systemic inflammatory response syndrome (SIRS) (HCC) Assessment & Plan - LR 1 L bolus ordered with instructions continue sodium chloride 75 mL/h   Hip pain Assessment & Plan - Ketorolac 15 mg IV every 6 hours as needed for moderate pain, 1 day ordered  Hypomagnesemia Assessment & Plan - Replace with magnesium 2 grams IV - Check magnesium level in the a.m.  Hypocalcemia Assessment & Plan - Replace with Tums p.o. - Check ionized calcium  Hypokalemia Assessment & Plan - Replace with potassium chloride p.o.  Hyperlipidemia Assessment & Plan - Resume simvastatin 20 mg nightly  Leukocytosis Assessment & Plan -  Etiology work-up in progress - Check blood cultures x2, procalcitonin, UA, Legionella urine antigen - CBC in a.m.  * Hyponatremia Assessment & Plan - Moderate to severe, etiology work-up in progress - Corrected serum  sodium with glucose is 127, [place], 1999 - Check urine osmolality and urine sodium - BMP in the a.m.  Chart reviewed.   DVT prophylaxis: Enoxaparin Code Status: Full code, confirmed with caregiver at bedside Diet: Heart healthy Family Communication: Updated caregiver at bedside Disposition Plan: Pending clinical course Consults called:  Admission status: Telemetry medical, observation  Past Medical History:  Diagnosis Date   Breast cancer ([place])    Cognitive dysfunction    mental retardation (mild to moderate)   Diabetes mellitus, type 2 ([place])    diet controlled   Family history of breast cancer    High cholesterol    History of 2019 novel coronavirus disease (COVID-19) 11/04/2020   a.) resides in group home; others were sick with SARS-Cov-2. Home test (+).   Hypertension    Meningitis    in childhood   Obesity (BMI 30-39.9)    Overactive bladder    Seizure ([place])    last seizure 2000   Past Surgical History:  Procedure Laterality Date   ABDOMINAL HYSTERECTOMY     as a teenager   BREAST BIOPSY Right    neg   BREAST BIOPSY Left 10/18/2020   u/s breast  biopsy, "venus" clip-INVASIVE MAMMARY CARCINOMA   CHOLECYSTECTOMY     COLONOSCOPY WITH PROPOFOL N/A 08/27/2015   Procedure: COLONOSCOPY WITH PROPOFOL;  Surgeon: Lucilla Lame, MD;  Location: [facility] ENDOSCOPY;  Service: Endoscopy;  Laterality: N/A;   PART MASTECTOMY,RADIO FREQUENCY LOCALIZER,AXILLARY SENTINEL NODE BIOPSY Left 11/22/2020   Procedure: PART MASTECTOMY, RADIO FREQUENCY LOCALIZER,AXILLARY SENTINEL NODE BIOPSY;  Surgeon: Herbert Pun, MD;  Location: [facility] ORS;  Service: General;  Laterality: Left;   PORTACATH PLACEMENT N/A 12/31/2020   Procedure: INSERTION PORT-A-CATH;  Surgeon: Herbert Pun, MD;  Location: [facility] ORS;  Service: General;  Laterality: N/A;   Social History:  reports that she has never smoked. She has never used smokeless tobacco. She reports that she does not drink alcohol and does not  use drugs.  No Known Allergies Family History  Problem Relation Age of Onset   Diabetes Father    Heart disease Father    Heart disease Brother    Breast cancer Cousin        dx 10s   Family history: Family history reviewed and not pertinent.  Prior to Admission medications   Medication Sig Start Date End Date Taking? Authorizing Provider  acetaminophen (TYLENOL) 500 MG tablet Take 1,000 mg by mouth every 8 (eight) hours as needed (pain.).   Yes [provider]  amLODipine (NORVASC) 5 MG tablet Take 5 mg by mouth in the morning.   Yes [provider]  aspirin 81 MG EC tablet Take 81 mg by mouth in the morning. Swallow whole.   Yes [provider]  carbamazepine (TEGRETOL) 200 MG tablet TAKE ONE TABLET BY MOUTH THREE TIMES DAILY. (SEIZURE CONTROL) 10/21/17  Yes Dennie Bible, NP  Cholecalciferol (VITAMIN D) 50 MCG (2000 UT) tablet Take 4,000 Units by mouth in the morning.   Yes [provider]  dexamethasone (DECADRON) 4 MG tablet Take 2 tablets (8 mg total) by mouth 2 (two) times daily. Start the day before Taxotere. Then again the day after chemo for 3 days. 12/23/20  Yes Earlie Server, MD  HYDROcodone-acetaminophen [facility]) 10-325 MG tablet Take  1 tablet by mouth every 6 (six) hours as needed.   Yes [provider]  ibuprofen (ADVIL) 200 MG tablet Take 400 mg by mouth every 8 (eight) hours as needed (pain.).   Yes [provider]  lidocaine-prilocaine (EMLA) cream Apply to affected area once 12/23/20  Yes Earlie Server, MD  lisinopril-hydrochlorothiazide (ZESTORETIC) 20-12.5 MG tablet Take 1 tablet by mouth every morning. 07/08/15  Yes [provider]  loratadine (CLARITIN) 10 MG tablet Take 1 tablet (10 mg total) by mouth daily. Take Claritin daily for 4 days, Day1-4 of each chemo cycle. 01/08/21  Yes Earlie Server, MD  metFORMIN (GLUCOPHAGE) 500 MG tablet Take by mouth 2 (two) times daily with a meal. 01/10/21 01/10/22 Yes [provider]  ondansetron (ZOFRAN) 8 MG tablet Take 1 tablet (8 mg total) by mouth 2 (two) times daily as needed for refractory nausea / vomiting. Start on day 3 after chemo. 12/23/20  Yes Earlie Server, MD  prochlorperazine (COMPAZINE) 10 MG tablet Take 1 tablet (10 mg total) by mouth every 6 (six) hours as needed (Nausea or vomiting). 12/23/20  Yes Earlie Server, MD  simvastatin (ZOCOR) 20 MG tablet Take 20 mg by mouth in the morning.   Yes [provider]   Physical Exam: Vitals:   02/14/21 1754 02/14/21 1800 02/14/21 2254 02/14/21 2354  BP: (!) 127/46 133/82 (!) 175/84 (!) 104/53  Pulse: (!) 107 (!) 110 (!) 120 (!) 110  Resp: 18  18 20   Temp:   98.4 F (36.9 C) 98.1 F (36.7 C)  TempSrc:    Oral  SpO2: 100% 100% 97% 94%  Weight:      Height:       Constitutional: appears age-appropriate, NAD, calm, comfortable Eyes: PERRL, lids and conjunctivae normal HENMT: Bald head, mucous membranes are moist. Posterior pharynx clear of any exudate or lesions. Age-appropriate dentition. Hearing appropriate Neck: normal, supple, no masses, no thyromegaly Respiratory: clear to auscultation bilaterally, no wheezing, no crackles. Normal respiratory effort. No accessory muscle use.  Cardiovascular: Regular rate and rhythm, no murmurs / rubs / gallops. No extremity edema. 2+ pedal pulses. No carotid bruits.  Abdomen: Obese abdomen, no tenderness, no masses palpated, no hepatosplenomegaly. Bowel sounds positive.  Musculoskeletal: no clubbing / cyanosis. No joint deformity upper and lower extremities. Good ROM, no contractures, no atrophy. Normal muscle tone.  Skin: no rashes, lesions, ulcers. No induration Neurologic: Sensation intact. Strength 5/5 in bilateral upper extremity.  Strength is 4 out of 5 in the right lower extremity. Psychiatric: Normal judgment and insight. Alert and oriented x 3. Normal mood.   EKG: independently reviewed, showing sinus tachycardia with rate of 106, QTc 459  Chest  x-ray on Admission: I personally reviewed and I agree with radiologist reading as below.  DG Chest 2 View  Result Date: 02/14/2021 CLINICAL DATA:  Abnormal chest x-ray, weak, hyponatremia EXAM: CHEST - 2 VIEW COMPARISON:  02/14/2021, 12/31/2020 FINDINGS: Frontal and lateral views of the chest demonstrates stable right chest wall port. Cardiac silhouette is unremarkable. Patient is slightly rotated to the right, accentuating the vascular markings at the right lung base. No airspace disease identified on lateral view. No effusion or pneumothorax. No acute bony abnormalities. IMPRESSION: 1. No acute airspace disease. 2. Densities in the medial right lung base on prior exam correspond to prominent vascular shadows and superimposed structures on this study. Electronically Signed   By: Randa Ngo M.D.   On: 02/14/2021 18:42   MR HIP RIGHT WO CONTRAST  Result Date: 02/14/2021 CLINICAL DATA:  Hip pain for 1 week, pain with ambulation EXAM: MR OF THE RIGHT HIP WITHOUT CONTRAST TECHNIQUE: Multiplanar, multisequence MR imaging was performed. No intravenous contrast was administered. COMPARISON:  02/14/2021 FINDINGS: Bones: There are no acute or destructive bony lesions. No abnormal signal. Articular cartilage and labrum Articular cartilage:  No gross abnormalities. Labrum:  Limited visualization. Joint or bursal effusion Joint effusion:  Trace symmetrical bilateral hip effusions. Bursae: There is a small amount of fluid medial to the right ischial tuberosity near the insertion of the obturator internus muscle, which may reflect bursitis. Muscles and tendons Muscles and tendons: Minimal edema within the insertion of the right obturator internus muscle near the ischial tuberosity. Fluid signal along the shield tuberosities may also reflect superimposed bursitis. Remaining muscular structures are unremarkable. Other findings Miscellaneous:   Visualized intrapelvic structures are unremarkable. IMPRESSION: 1. Minimal  fluid signal within the insertion of the right obturator internus muscle, as well as along the medial margin of the right ischial tuberosity. Findings consistent with muscular strain and associated obturator internus bursitis. 2. No acute or destructive bony lesions. Electronically Signed   By: Randa Ngo M.D.   On: 02/14/2021 20:25   DG Chest Portable 1 View  Result Date: 02/14/2021 CLINICAL DATA:  Weak and hyponatremic EXAM: PORTABLE CHEST 1 VIEW.  Patient is slightly rotated. COMPARISON:  Chest x-ray 12/31/2020. FINDINGS: Right chest wall Port-A-Cath with tip overlying the expected region of the distal superior vena cava just proximal to the superior cavoatrial junction. Poorly visualized right heart border. Otherwise the heart and mediastinal contours are unchanged. Aortic calcification. Query right middle lobe opacity. No pulmonary edema. No pleural effusion. No pneumothorax. No acute osseous abnormality. IMPRESSION: 1. Query right middle lobe opacity. Recommend repeat PA and lateral view of the chest. 2.  Aortic Atherosclerosis (ICD10-I70.0). Electronically Signed   By: Iven Finn M.D.   On: 02/14/2021 17:59   DG Hip Unilat W or Wo Pelvis 2-3 Views Right  Result Date: 02/14/2021 CLINICAL DATA:  Right hip pain EXAM: DG HIP (WITH OR WITHOUT PELVIS) 2-3V RIGHT COMPARISON:  None. FINDINGS: No recent fracture or dislocation is seen. Degenerative changes are noted with small bony spurs. Degenerative changes are noted in the pubic symphysis and visualized lower lumbar spine. IMPRESSION: No recent fracture or dislocation is seen in the right hip. Degenerative changes with small bony spurs are noted in the right hip. Electronically Signed   By: Elmer Picker M.D.   On: 02/14/2021 15:39    Labs on Admission: I have personally reviewed following labs  CBC: Recent Labs  Lab 02/14/21 1433  WBC 16.5*  HGB 9.2*  HCT 27.5*  MCV 89.9  PLT 106   Basic Metabolic Panel: Recent Labs  Lab  02/14/21 1433 02/14/21 2120  NA 123*  --   K 3.4*  --   CL 86*  --   CO2 25  --   GLUCOSE 285*  --   BUN 15  --   CREATININE 0.81  --   CALCIUM 8.0*  --   MG  --  1.5*  PHOS  --  3.1   GFR: Estimated Creatinine Clearance: 65.3 mL/min (by C-G formula based on SCr of 0.81 mg/dL).  Liver Function Tests: Recent Labs  Lab 02/14/21 1440  AST 31  ALT 67*  ALKPHOS 67  BILITOT 0.6  PROT 6.5  ALBUMIN 2.4*   Urine analysis:    Component Value Date/Time   COLORURINE AMBER (A)  04/15/2019 2251   APPEARANCEUR CLOUDY (A) 04/15/2019 2251   LABSPEC 1.025 04/15/2019 2251   PHURINE 5.0 04/15/2019 2251   GLUCOSEU 50 (A) 04/15/2019 2251   HGBUR NEGATIVE 04/15/2019 2251   BILIRUBINUR SMALL (A) 04/15/2019 2251   KETONESUR NEGATIVE 04/15/2019 2251   PROTEINUR 100 (A) 04/15/2019 2251   NITRITE NEGATIVE 04/15/2019 2251   LEUKOCYTESUR TRACE (A) 04/15/2019 2251   CRITICAL CARE Performed by: Briant Cedar [place]  Total critical care time: 35 minutes  Critical care time was exclusive of separately billable procedures and treating other patients.  Critical care was necessary to treat or prevent imminent or life-threatening deterioration.  Critical care was time spent personally by me on the following activities: development of treatment plan with patient and/or surrogate as well as nursing, discussions with consultants, evaluation of patient's response to treatment, examination of patient, obtaining history from patient or surrogate, ordering and performing treatments and interventions, ordering and review of laboratory studies, ordering and review of radiographic studies, pulse oximetry and re-evaluation of patient's condition.  Dr. Tobie Poet [facility]  If 7PM-7AM, please contact overnight-coverage provider If 7AM-7PM, please contact day coverage provider [email or web address]  02/15/2021, 12:48 AM

## 2021-02-14 NOTE — Assessment & Plan Note (Addendum)
-   Replace with Tums p.o. - Check ionized calcium

## 2021-02-14 NOTE — Assessment & Plan Note (Addendum)
-   Ketorolac 15 mg IV every 6 hours as needed for moderate pain, 1 day ordered

## 2021-02-14 NOTE — ED Provider Notes (Signed)
[facility] Provider Note    Event Date/Time   First MD Initiated Contact with Patient 02/14/21 1748     (approximate)   History   Hip Pain   HPI 1} Tamara Mejia is a 71 y.o. female patient complains of right hip pain since Saturday.  No history of falls.  She is walking on it.  But caregiver with her from group home says barely and having to hold onto things hip hurts to move it.  Staff at the group home reported she is more weak than usual.  Patient is getting chemotherapy for breast cancer but the next dose is due this coming Wednesday.  She has not had it for 3 weeks.  She is not been running a fever does not have any coughing or dysuria or abdominal pain.  Here she is slightly tachycardic had a widened pulse pressure but no fever.  Past medical history:  Seizure disorder, primary generalized ([place]) Encounter for long-term (current) use of other medications DMII (diabetes mellitus, type 2) ([place]) Health care maintenance HTN (hypertension), benign Hyperlipidemia OAB (overactive bladder) Seizure ([place]) Severe obesity (BMI 35.0-35.9 with comorbidity) ([place]) Special screening for malignant neoplasms, colon Rectal polyp Hypokalemia Syncope Allergic reaction Hypocalcemia Hypomagnesemia Acute gastroenteritis Malignant neoplasm of left breast in female, estrogen receptor positive ([place]) Goals of care, counseling/discussion Family history of breast cancer Encounter for monitoring cardiotoxic drug therapy Encounter for antineoplastic chemotherapy         ;   Physical Exam   Triage Vital Signs: ED Triage Vitals  Enc Vitals Group     BP 02/14/21 1433 111/66     Pulse Rate 02/14/21 1433 (!) 103     Resp 02/14/21 1433 20     Temp 02/14/21 1432 98.4 F (36.9 C)     Temp src --      SpO2 02/14/21 1433 97 %     Weight 02/14/21 1430 185 lb (83.9 kg)     Height 02/14/21 1430 5\' 1"  (1.549 m)     Head Circumference --      Peak Flow --       Pain Score 02/14/21 1429 10     Pain Loc --      Pain Edu? --      Excl. in [place]? --     Most recent vital signs: Vitals:   02/14/21 1800 02/14/21 2254  BP: 133/82 (!) 175/84  Pulse: (!) 110 (!) 120  Resp:  18  Temp:  98.4 F (36.9 C)  SpO2: 100% 97%     General: Awake, no distress.  CV:  Good peripheral perfusion.  Regular rate and rhythm slightly fast no audible murmurs Resp:  Normal effort.  Lungs occasional scattered crackles Abd:  No distention.  Soft and nontender Extremities left hip is nontender with movement.  Right hip hurts when I raise the leg passively or movement otherwise.   ED Results / Procedures / Treatments   Labs (all labs ordered are listed, but only abnormal results are displayed) Labs Reviewed  BASIC METABOLIC PANEL - Abnormal; Notable for the following components:      Result Value   Sodium 123 (*)    Potassium 3.4 (*)    Chloride 86 (*)    Glucose, Bld 285 (*)    Calcium 8.0 (*)    All other components within normal limits  CBC - Abnormal; Notable for the following components:   WBC 16.5 (*)    RBC 3.06 (*)  Hemoglobin 9.2 (*)    HCT 27.5 (*)    All other components within normal limits  HEPATIC FUNCTION PANEL - Abnormal; Notable for the following components:   Albumin 2.4 (*)    ALT 67 (*)    All other components within normal limits  MAGNESIUM - Abnormal; Notable for the following components:   Magnesium 1.5 (*)    All other components within normal limits  GLUCOSE, CAPILLARY - Abnormal; Notable for the following components:   Glucose-Capillary 266 (*)    All other components within normal limits  RESP PANEL BY RT-PCR (FLU A&B, COVID) ARPGX2  CULTURE, BLOOD (ROUTINE X 2)  CULTURE, BLOOD (ROUTINE X 2)  PROCALCITONIN  PHOSPHORUS  LACTIC ACID, PLASMA  URINALYSIS, ROUTINE W REFLEX MICROSCOPIC  PROCALCITONIN  HIV ANTIBODY (ROUTINE TESTING W REFLEX)  BASIC METABOLIC PANEL  CBC  LACTIC ACID, PLASMA  OSMOLALITY, URINE  SODIUM,  URINE, RANDOM  LEGIONELLA PNEUMOPHILA SEROGP 1 UR AG  CALCIUM, IONIZED  CBG MONITORING, ED     EKG  EKG read interpreted by me shows normal sinus rhythm rate of 106 normal axis no acute ST-T wave changes are seen   RADIOLOGY  Hip x-ray read by radiology reviewed by me shows no obvious fracture there is some spurring but very slight.  Some DJD is also present. Chest x-ray seems to show some fullness in the right middle lung area.  Radiology reads the film and agrees.  They recommend PA and lateral film which I have ordered. MRI read by radiology shows findings consistent with obturator internus bursitis and muscle strain PROCEDURES:  Critical Care performed:   Procedures   MEDICATIONS ORDERED IN ED: Medications  acetaminophen (TYLENOL) tablet 650 mg (has no administration in time range)    Or  acetaminophen (TYLENOL) suppository 650 mg (has no administration in time range)  ondansetron (ZOFRAN) tablet 4 mg (has no administration in time range)    Or  ondansetron (ZOFRAN) injection 4 mg (has no administration in time range)  enoxaparin (LOVENOX) injection 40 mg (40 mg Subcutaneous Given 02/14/21 2300)  aspirin EC tablet 81 mg (has no administration in time range)  HYDROcodone-acetaminophen (NORCO) 10-325 MG per tablet 1 tablet (1 tablet Oral Given 02/14/21 2259)  amLODipine (NORVASC) tablet 5 mg (has no administration in time range)  simvastatin (ZOCOR) tablet 20 mg (has no administration in time range)  metFORMIN (GLUCOPHAGE) tablet 500 mg (has no administration in time range)  carbamazepine (TEGRETOL) tablet 200 mg (200 mg Oral Given 02/14/21 2329)  LORazepam (ATIVAN) injection 2 mg (has no administration in time range)  lisinopril (ZESTRIL) tablet 20 mg (has no administration in time range)    And  hydrochlorothiazide (HYDRODIURIL) tablet 12.5 mg (has no administration in time range)  magnesium sulfate IVPB 2 g 50 mL (has no administration in time range)  azithromycin  (ZITHROMAX) 500 mg in sodium chloride 0.9 % 250 mL IVPB (has no administration in time range)  cefTRIAXone (ROCEPHIN) 2 g in sodium chloride 0.9 % 100 mL IVPB (has no administration in time range)  0.9 %  sodium chloride infusion (has no administration in time range)  metoprolol tartrate (LOPRESSOR) injection 2.5 mg (has no administration in time range)  potassium chloride SA (KLOR-CON M) CR tablet 20 mEq (has no administration in time range)  calcium carbonate (TUMS - dosed in mg elemental calcium) chewable tablet 200 mg of elemental calcium (has no administration in time range)  ketorolac (TORADOL) 15 MG/ML injection 15 mg (has no administration  in time range)     IMPRESSION / MDM / ASSESSMENT AND PLAN / ED COURSE  I reviewed the triage vital signs and the nursing notes.  Patient having hip pain and difficulty walking but this appears to be due to muscle strain and bursitis by MRI.  Patient's white count is somewhat elevated but I cannot find a source for this.  Initial chest x-ray looks suspicious but repeat PA and lateral chest x-ray showed this was only overlapping shadows.  Urinalysis is still not been obtained at this point.  Patient does not appear to have any symptoms of UTI however.  Patient's sodium is low.  Chest x-ray was obtained to evaluate for any possible intrapulmonary problems but none showed up.  She may just be drinking too much water.  She may be having fluid overload or has somehow lost the sodium from any 1 of a number of problems to include kidney problems.  The sodium is low enough that she could have a seizure especially if she drops lower.  We will have to get her in the hospital and evaluate this and treated fully.  We will also continue evaluating the high white blood count. I discussed the patient in detail with the hospitalist.        FINAL CLINICAL IMPRESSION(S) / ED DIAGNOSES   Final diagnoses:  Hyponatremia  Hip pain  Leukocytosis, unspecified type      Rx / DC Orders   ED Discharge Orders     None        Note:  This document was prepared using Dragon voice recognition software and may include unintentional dictation errors.   Nena Polio, MD 02/14/21 [phone]

## 2021-02-14 NOTE — ED Notes (Signed)
Patient transported to MRI 

## 2021-02-14 NOTE — Assessment & Plan Note (Signed)
-   Community acquired pneumonia - Ordered azithromycin and ceftriaxone IV, for 5 days

## 2021-02-15 DIAGNOSIS — Z20822 Contact with and (suspected) exposure to covid-19: Secondary | ICD-10-CM | POA: Diagnosis present

## 2021-02-15 DIAGNOSIS — M4620 Osteomyelitis of vertebra, site unspecified: Secondary | ICD-10-CM | POA: Diagnosis not present

## 2021-02-15 DIAGNOSIS — A4101 Sepsis due to Methicillin susceptible Staphylococcus aureus: Secondary | ICD-10-CM | POA: Diagnosis present

## 2021-02-15 DIAGNOSIS — A419 Sepsis, unspecified organism: Secondary | ICD-10-CM | POA: Diagnosis not present

## 2021-02-15 DIAGNOSIS — Z6835 Body mass index (BMI) 35.0-35.9, adult: Secondary | ICD-10-CM | POA: Diagnosis not present

## 2021-02-15 DIAGNOSIS — E649 Sequelae of unspecified nutritional deficiency: Secondary | ICD-10-CM | POA: Diagnosis not present

## 2021-02-15 DIAGNOSIS — D638 Anemia in other chronic diseases classified elsewhere: Secondary | ICD-10-CM | POA: Diagnosis present

## 2021-02-15 DIAGNOSIS — M4626 Osteomyelitis of vertebra, lumbar region: Secondary | ICD-10-CM | POA: Diagnosis not present

## 2021-02-15 DIAGNOSIS — R7881 Bacteremia: Secondary | ICD-10-CM | POA: Diagnosis present

## 2021-02-15 DIAGNOSIS — Y848 Other medical procedures as the cause of abnormal reaction of the patient, or of later complication, without mention of misadventure at the time of the procedure: Secondary | ICD-10-CM | POA: Diagnosis present

## 2021-02-15 DIAGNOSIS — T80211A Bloodstream infection due to central venous catheter, initial encounter: Secondary | ICD-10-CM | POA: Diagnosis present

## 2021-02-15 DIAGNOSIS — D72829 Elevated white blood cell count, unspecified: Secondary | ICD-10-CM | POA: Diagnosis not present

## 2021-02-15 DIAGNOSIS — E8809 Other disorders of plasma-protein metabolism, not elsewhere classified: Secondary | ICD-10-CM | POA: Diagnosis present

## 2021-02-15 DIAGNOSIS — F32A Depression, unspecified: Secondary | ICD-10-CM | POA: Diagnosis present

## 2021-02-15 DIAGNOSIS — R000 Tachycardia, unspecified: Secondary | ICD-10-CM | POA: Diagnosis present

## 2021-02-15 DIAGNOSIS — B9561 Methicillin susceptible Staphylococcus aureus infection as the cause of diseases classified elsewhere: Secondary | ICD-10-CM | POA: Diagnosis not present

## 2021-02-15 DIAGNOSIS — E1169 Type 2 diabetes mellitus with other specified complication: Secondary | ICD-10-CM | POA: Diagnosis present

## 2021-02-15 DIAGNOSIS — G40909 Epilepsy, unspecified, not intractable, without status epilepticus: Secondary | ICD-10-CM | POA: Diagnosis present

## 2021-02-15 DIAGNOSIS — R8290 Unspecified abnormal findings in urine: Secondary | ICD-10-CM | POA: Diagnosis present

## 2021-02-15 DIAGNOSIS — M25559 Pain in unspecified hip: Secondary | ICD-10-CM | POA: Diagnosis not present

## 2021-02-15 DIAGNOSIS — G061 Intraspinal abscess and granuloma: Secondary | ICD-10-CM | POA: Diagnosis present

## 2021-02-15 DIAGNOSIS — F70 Mild intellectual disabilities: Secondary | ICD-10-CM | POA: Diagnosis present

## 2021-02-15 DIAGNOSIS — C50912 Malignant neoplasm of unspecified site of left female breast: Secondary | ICD-10-CM | POA: Diagnosis present

## 2021-02-15 DIAGNOSIS — E7800 Pure hypercholesterolemia, unspecified: Secondary | ICD-10-CM | POA: Diagnosis present

## 2021-02-15 DIAGNOSIS — I10 Essential (primary) hypertension: Secondary | ICD-10-CM | POA: Diagnosis present

## 2021-02-15 DIAGNOSIS — E876 Hypokalemia: Secondary | ICD-10-CM | POA: Diagnosis present

## 2021-02-15 DIAGNOSIS — D649 Anemia, unspecified: Secondary | ICD-10-CM | POA: Diagnosis not present

## 2021-02-15 DIAGNOSIS — F819 Developmental disorder of scholastic skills, unspecified: Secondary | ICD-10-CM | POA: Diagnosis present

## 2021-02-15 DIAGNOSIS — G3184 Mild cognitive impairment, so stated: Secondary | ICD-10-CM | POA: Diagnosis present

## 2021-02-15 DIAGNOSIS — Z8616 Personal history of COVID-19: Secondary | ICD-10-CM | POA: Diagnosis not present

## 2021-02-15 DIAGNOSIS — M25551 Pain in right hip: Secondary | ICD-10-CM | POA: Diagnosis present

## 2021-02-15 DIAGNOSIS — C50919 Malignant neoplasm of unspecified site of unspecified female breast: Secondary | ICD-10-CM | POA: Diagnosis not present

## 2021-02-15 DIAGNOSIS — N3281 Overactive bladder: Secondary | ICD-10-CM | POA: Diagnosis present

## 2021-02-15 DIAGNOSIS — E871 Hypo-osmolality and hyponatremia: Secondary | ICD-10-CM | POA: Diagnosis present

## 2021-02-15 LAB — BLOOD CULTURE ID PANEL (REFLEXED) - BCID2

## 2021-02-15 LAB — URINALYSIS, ROUTINE W REFLEX MICROSCOPIC
Bilirubin Urine: NEGATIVE
Glucose, UA: 100 mg/dL — AB
Ketones, ur: NEGATIVE mg/dL
Leukocytes,Ua: NEGATIVE
Nitrite: POSITIVE — AB
Protein, ur: 30 mg/dL — AB
Specific Gravity, Urine: 1.01 (ref 1.005–1.030)
pH: 6 (ref 5.0–8.0)

## 2021-02-15 LAB — BASIC METABOLIC PANEL
Anion gap: 7 (ref 5–15)
Anion gap: 9 (ref 5–15)
BUN: 12 mg/dL (ref 8–23)
BUN: 14 mg/dL (ref 8–23)
CO2: 28 mmol/L (ref 22–32)
CO2: 28 mmol/L (ref 22–32)
Calcium: 7.6 mg/dL — ABNORMAL LOW (ref 8.9–10.3)
Calcium: 8.1 mg/dL — ABNORMAL LOW (ref 8.9–10.3)
Chloride: 86 mmol/L — ABNORMAL LOW (ref 98–111)
Chloride: 92 mmol/L — ABNORMAL LOW (ref 98–111)
Creatinine, Ser: 0.69 mg/dL (ref 0.44–1.00)
Creatinine, Ser: 0.71 mg/dL (ref 0.44–1.00)
GFR, Estimated: >60 mL/min (ref >60)
GFR, Estimated: >60 mL/min (ref >60)
Glucose, Bld: 201 mg/dL — ABNORMAL HIGH (ref 70–99)
Glucose, Bld: 296 mg/dL — ABNORMAL HIGH (ref 70–99)
Potassium: 3.5 mmol/L (ref 3.5–5.1)
Potassium: 3.7 mmol/L (ref 3.5–5.1)
Sodium: 123 mmol/L — ABNORMAL LOW (ref 135–145)
Sodium: 127 mmol/L — ABNORMAL LOW (ref 135–145)

## 2021-02-15 LAB — MAGNESIUM: Magnesium: 1.9 mg/dL (ref 1.7–2.4)

## 2021-02-15 LAB — GLUCOSE, CAPILLARY
Glucose-Capillary: 197 mg/dL — ABNORMAL HIGH (ref 70–99)
Glucose-Capillary: 187 mg/dL — ABNORMAL HIGH (ref 70–99)
Glucose-Capillary: 189 mg/dL — ABNORMAL HIGH (ref 70–99)
Glucose-Capillary: 145 mg/dL — ABNORMAL HIGH (ref 70–99)

## 2021-02-15 LAB — PROCALCITONIN: Procalcitonin: 0.22 ng/mL

## 2021-02-15 LAB — URINALYSIS, MICROSCOPIC (REFLEX)

## 2021-02-15 LAB — CBC
HCT: 24.2 % — ABNORMAL LOW (ref 36.0–46.0)
Hemoglobin: 8.3 g/dL — ABNORMAL LOW (ref 12.0–15.0)
MCH: 30.6 pg (ref 26.0–34.0)
MCHC: 34.3 g/dL (ref 30.0–36.0)
MCV: 89.3 fL (ref 80.0–100.0)
Platelets: 320 10*3/uL (ref 150–400)
RBC: 2.71 MIL/uL — ABNORMAL LOW (ref 3.87–5.11)
RDW: 13.7 % (ref 11.5–15.5)
WBC: 12.3 10*3/uL — ABNORMAL HIGH (ref 4.0–10.5)
nRBC: 0 % (ref 0.0–0.2)

## 2021-02-15 LAB — HIV ANTIBODY (ROUTINE TESTING W REFLEX): HIV Screen 4th Generation wRfx: NONREACTIVE

## 2021-02-15 LAB — SODIUM, URINE, RANDOM: Sodium, Ur: <10 mmol/L

## 2021-02-15 LAB — LACTIC ACID, PLASMA: Lactic Acid, Venous: 1.8 mmol/L (ref 0.5–1.9)

## 2021-02-15 LAB — OSMOLALITY: Osmolality: 273 mOsm/kg — ABNORMAL LOW (ref 275–295)

## 2021-02-15 LAB — OSMOLALITY, URINE: Osmolality, Ur: 377 mOsm/kg (ref 300–900)

## 2021-02-15 MED ORDER — CEFAZOLIN SODIUM-DEXTROSE 2-4 GM/100ML-% IV SOLN
2.0000 g | Freq: Three times a day (TID) | INTRAVENOUS | Status: DC
  Administered 2021-02-15 – 2021-02-21 (×18): 2 g via INTRAVENOUS
  Filled 2021-02-15 (×18): qty 100

## 2021-02-15 MED ORDER — LACTATED RINGERS IV BOLUS
1000.0000 mL | Freq: Once | INTRAVENOUS | Status: AC
  Administered 2021-02-15: 1000 mL via INTRAVENOUS

## 2021-02-15 MED ORDER — CHLORHEXIDINE GLUCONATE CLOTH 2 % EX PADS
6.0000 | MEDICATED_PAD | Freq: Every day | CUTANEOUS | Status: DC
  Administered 2021-02-15 – 2021-02-28 (×12): 6 via TOPICAL

## 2021-02-15 MED ORDER — ALTEPLASE 2 MG IJ SOLR
2.0000 mg | Freq: Once | INTRAMUSCULAR | Status: AC
  Administered 2021-02-15: 17:00:00 2 mg
  Filled 2021-02-15: qty 2

## 2021-02-15 MED ORDER — AZITHROMYCIN 500 MG PO TABS
500.0000 mg | ORAL_TABLET | Freq: Every day | ORAL | Status: DC
  Administered 2021-02-16: 500 mg via ORAL
  Filled 2021-02-15: qty 1

## 2021-02-15 MED ORDER — LACTATED RINGERS IV SOLN: INTRAVENOUS | Status: DC

## 2021-02-15 NOTE — Progress Notes (Signed)
PHARMACIST - PHYSICIAN COMMUNICATION ° °CONCERNING: Antibiotic IV to Oral Route Change Policy ° °RECOMMENDATION: °This patient is receiving azithromycin by the intravenous route.  Based on criteria approved by the Pharmacy and Therapeutics Committee, the antibiotic(s) is/are being converted to the equivalent oral dose form(s). ° ° °DESCRIPTION: °These criteria include: °Patient being treated for a respiratory tract infection, urinary tract infection, cellulitis or clostridium difficile associated diarrhea if on metronidazole °The patient is not neutropenic and does not exhibit a GI malabsorption state °The patient is eating (either orally or via tube) and/or has been taking other orally administered medications for a least 24 hours °The patient is improving clinically and has a Tmax < 100.5 ° °If you have questions about this conversion, please contact the Pharmacy Department  ° °Jannifer Fischler B Harlyn Italiano  °02/15/21  °  °

## 2021-02-15 NOTE — Assessment & Plan Note (Signed)
-   LR 1 L bolus ordered with instructions continue sodium chloride 75 mL/h

## 2021-02-15 NOTE — Progress Notes (Addendum)
Progress Note   Patient: Tamara Mejia YTK:[id number] DOB: [birth year] DOA: 02/14/2021     0 DOS: the patient was seen and examined on 02/15/2021   Brief hospital course: Same day as admission rounding note.  See full H&P by Dr. Tobie Poet.  I have reviewed the assessment and plan in the H&P and agree.  In addition: --Blood cultures positive for MSSA, now on cefazolin --Sodium level improving 123>> 127 --Hypomagnesemia resolved with replacement --PT evaluation given right hip pain --Patient met sepsis criteria on admission with leukocytosis, tachycardia in the setting of bacteremia, problem list updated -- Hold HCTZ given hyponatremia --Resume further IV fluids at maintenance rate -- 2D echo given bacteremia  Assessment and Plan See H&P and above     Subjective: Patient up in recliner when seen today.  She reports right hip pain.  Regarding respiratory symptoms she does report nonproductive cough.  Reports feeling cold recently but no fevers.  Her primary complaint continues to be the right hip and leg pain.  Objective Vitals reviewed and notable for labile blood pressure (172/61, later this morning 99/79).  Heart rate in the low 100s.  No hypoxia or fevers.  General exam: awake, alert, no acute distress HEENT: moist mucus membranes, hearing grossly normal  Respiratory system: CTAB, no wheezes, rales or rhonchi, normal respiratory effort. Cardiovascular system: normal S1/S2, trace lower extremity edema.   Gastrointestinal system: soft, NT, ND, no HSM felt, +bowel sounds. Central nervous system: no gross focal neurologic deficits, normal speech Extremities: TED hose on lower extremity, trace lower extremity edema, normal tone Skin: dry, intact, normal temperature Psychiatry: normal mood, flat affect, judgement and insight appear abnormal due to intellectual does believe he   Data Reviewed:  Labs reviewed and notable for improvement in sodium from 1 23-1 27, chloride 92, glucose  201, calcium 7.6, serum osmolality 273, blood culture positive for MSSA  Family Communication: None at bedside during rounds  Disposition: Status is: Inpatient  Remains inpatient appropriate because: Severity of illness on IV therapies as outlined above and electrolyte abnormalities that require close monitoring and correction         No charge  Author: Ezekiel Slocumb, DO 02/15/2021 3:12 PM  For on call review [email or web address].

## 2021-02-15 NOTE — Progress Notes (Signed)
PHARMACY - PHYSICIAN COMMUNICATION CRITICAL VALUE ALERT - BLOOD CULTURE IDENTIFICATION (BCID)  Tamara Mejia is an 71 y.o. female who presented to [facility] on 02/14/2021 with a chief complaint of MSSA bactermia  Assessment:  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  Name of physician (or Provider) Contacted: Ethelene Hal  Current antibiotics: Cefazolin  Changes to prescribed antibiotics recommended:  Patient is on recommended antibiotics - No changes needed  Results for orders placed or performed during the hospital encounter of 02/14/21  Blood Culture ID Panel (Reflexed) (Collected: 02/14/2021  9:20 PM)  Result Value Ref Range   Enterococcus faecalis NOT DETECTED NOT DETECTED   Enterococcus Faecium NOT DETECTED NOT DETECTED   Listeria monocytogenes NOT DETECTED NOT DETECTED   Staphylococcus species DETECTED (A) NOT DETECTED   Staphylococcus aureus (BCID) DETECTED (A) NOT DETECTED   Staphylococcus epidermidis NOT DETECTED NOT DETECTED   Staphylococcus lugdunensis NOT DETECTED NOT DETECTED   Streptococcus species NOT DETECTED NOT DETECTED   Streptococcus agalactiae NOT DETECTED NOT DETECTED   Streptococcus pneumoniae NOT DETECTED NOT DETECTED   Streptococcus pyogenes NOT DETECTED NOT DETECTED   A.calcoaceticus-baumannii NOT DETECTED NOT DETECTED   Bacteroides fragilis NOT DETECTED NOT DETECTED   Enterobacterales NOT DETECTED NOT DETECTED   Enterobacter cloacae complex NOT DETECTED NOT DETECTED   Escherichia coli NOT DETECTED NOT DETECTED   Klebsiella aerogenes NOT DETECTED NOT DETECTED    Klebsiella oxytoca NOT DETECTED NOT DETECTED   Klebsiella pneumoniae NOT DETECTED NOT DETECTED   Proteus species NOT DETECTED NOT DETECTED   Salmonella species NOT DETECTED NOT DETECTED   Serratia marcescens NOT DETECTED NOT DETECTED   Haemophilus influenzae NOT DETECTED NOT DETECTED   Neisseria meningitidis NOT DETECTED NOT DETECTED   Pseudomonas aeruginosa NOT DETECTED NOT DETECTED   Stenotrophomonas maltophilia NOT DETECTED NOT DETECTED   Candida albicans NOT DETECTED NOT DETECTED   Candida auris NOT DETECTED NOT DETECTED   Candida glabrata NOT DETECTED NOT DETECTED   Candida krusei NOT DETECTED NOT DETECTED   Candida parapsilosis NOT DETECTED NOT DETECTED   Candida tropicalis NOT DETECTED NOT DETECTED   Cryptococcus neoformans/gattii NOT DETECTED NOT DETECTED   Meth resistant mecA/C and MREJ NOT DETECTED NOT DETECTED    Berta Minor 02/15/2021  1:32 PM

## 2021-02-15 NOTE — Plan of Care (Signed)
#  MSSA bacteremia -D/c ceftriaxone -start cefazolin -repeat blood Cx -Echo

## 2021-02-15 NOTE — Plan of Care (Signed)

## 2021-02-15 NOTE — Progress Notes (Signed)
°   02/15/21 1249  Assess: MEWS Score  Temp 99 F (37.2 C)  BP 140/71  Pulse Rate (!) 107  Resp 17  Level of Consciousness Alert  SpO2 99 %  O2 Device Room Air  Assess: MEWS Score  MEWS Temp 0  MEWS Systolic 0  MEWS Pulse 1  MEWS RR 0  MEWS LOC 0  MEWS Score 1  MEWS Score Color Green  Assess: if the MEWS score is Yellow or Red  Were vital signs taken at a resting state? Yes  Focused Assessment Change from prior assessment (see assessment flowsheet)  Does the patient meet 2 or more of the SIRS criteria? No  MEWS guidelines implemented *See Row Information* No, vital signs rechecked  Treat  Pain Scale 0-10  Pain Score 10  Faces Pain Scale 2  Pain Type Acute pain  Pain Location Hip  Pain Orientation Right  Pain Radiating Towards led  Pain Descriptors / Indicators Sharp  Pain Frequency Constant  Pain Onset On-going  Patients Stated Pain Goal 2  Pain Intervention(s) Pain med given for lower pain score than stated, per patient request;Cold applied  Document  Patient Outcome Stabilized after interventions;Other (Comment) (rechecked vitals)  Progress note created (see row info) Yes  Assess: SIRS CRITERIA  SIRS Temperature  0  SIRS Pulse 1  SIRS Respirations  0  SIRS WBC 0  SIRS Score Sum  1

## 2021-02-15 NOTE — Evaluation (Addendum)
Physical Therapy Evaluation Patient Details Name: Tamara Mejia MRN: [id number] DOB: 1950-12-26 Today's Date: 02/15/2021  History of Present Illness  Pt is a 71 y/o F admitted on 02/14/21 with c/c of R hip pain. Pt is admitted for work-up. MRI findings consistent with muscular strain and  associated obturator internus bursitis. PMH: L breast CA currently on chemo, childhood meningitis, mild<>moderate learning disability, cognitive dysfunction, tonic-clonic seziures, HTN, obesity, overactive bladder, DM2  Clinical Impression  Pt seen for PT evaluation with pt agreeable to tx. Pt reports prior to admission she was independent without AD. On this date, pt presents with R hip pain that limits mobility as pt requires extra time to complete all movements, reliance on hospital bed features and min assist for supine>sit, sit>stand & stand pivot with RW. Attempted to assist pt with ambulation but each time pt transfers to standing pt has urinary incontinence & PT/nurse assist pt with getting cleaned up. Anticipate pt will benefit from STR upon d/c to maximize independence with functional mobility & reduce fall risk prior to return home.   HR 101-116 bpm during session   Recommendations for follow up therapy are one component of a multi-disciplinary discharge planning process, led by the attending physician.  Recommendations may be updated based on patient status, additional functional criteria and insurance authorization.  Follow Up Recommendations Skilled nursing-short term rehab (<3 hours/day)    Assistance Recommended at Discharge Frequent or constant Supervision/Assistance  Patient can return home with the following  A lot of help with walking and/or transfers;A lot of help with bathing/dressing/bathroom;Assist for transportation;Assistance with cooking/housework;Direct supervision/assist for financial management;Help with stairs or ramp for entrance;Direct supervision/assist for medications  management    Equipment Recommendations Rolling walker (2 wheels);BSC/3in1  Recommendations for Other Services       Functional Status Assessment Patient has had a recent decline in their functional status and demonstrates the ability to make significant improvements in function in a reasonable and predictable amount of time.     Precautions / Restrictions Precautions Precautions: Fall Restrictions Weight Bearing Restrictions: Yes RLE Weight Bearing: Weight bearing as tolerated      Mobility  Bed Mobility Overal bed mobility: Needs Assistance Bed Mobility: Supine to Sit     Supine to sit: Min assist, HOB elevated     General bed mobility comments: extra time, use of bed rails, PT supporting/assisting RLE to EOB    Transfers Overall transfer level: Needs assistance Equipment used: Rolling walker (2 wheels) Transfers: Sit to/from Stand, Bed to chair/wheelchair/BSC Sit to Stand: Min assist   Step pivot transfers: Min assist            Ambulation/Gait                  Stairs            Wheelchair Mobility    Modified Rankin (Stroke Patients Only)       Balance Overall balance assessment: Needs assistance Sitting-balance support: Feet supported, Bilateral upper extremity supported Sitting balance-Leahy Scale: Good Sitting balance - Comments: supervision static sitting   Standing balance support: Bilateral upper extremity supported, During functional activity Standing balance-Leahy Scale: Poor Standing balance comment: reliant on RW 2/2 pain                             Pertinent Vitals/Pain Pain Assessment Pain Assessment: Faces Faces Pain Scale: Hurts even more Pain Location: R hip with movement Pain Descriptors /  Indicators: Discomfort, Grimacing Pain Intervention(s): Monitored during session, Repositioned    Home Living Family/patient expects to be discharged to:: Group home                        Prior  Function Prior Level of Function : Independent/Modified Independent             Mobility Comments: Independent without AD, 1 fall sliding out of char recently       Hand Dominance        Extremity/Trunk Assessment   Upper Extremity Assessment Upper Extremity Assessment: Overall WFL for tasks assessed    Lower Extremity Assessment Lower Extremity Assessment:  (RLE limited by pain. 2/5 knee extension in sitting)    Cervical / Trunk Assessment Cervical / Trunk Assessment: Normal  Communication   Communication: No difficulties  Cognition Arousal/Alertness: Awake/alert Behavior During Therapy: WFL for tasks assessed/performed Overall Cognitive Status: History of cognitive impairments - at baseline                                 General Comments: Pleasant lady, able to follow simple commands during session, baseline cognitive deficits. AxOx3 (initially reports year is 2013 but states month is January)        General Comments      Exercises     Assessment/Plan    PT Assessment Patient needs continued PT services  PT Problem List Decreased strength;Decreased mobility;Decreased safety awareness;Decreased range of motion;Decreased activity tolerance;Decreased balance;Decreased knowledge of use of DME;Pain;Cardiopulmonary status limiting activity       PT Treatment Interventions DME instruction;Therapeutic exercise;Gait training;Balance training;Stair training;Neuromuscular re-education;Functional mobility training;Therapeutic activities;Patient/family education;Modalities;Manual techniques    PT Goals (Current goals can be found in the Care Plan section)  Acute Rehab PT Goals Patient Stated Goal: decreased pain PT Goal Formulation: With patient Time For Goal Achievement: 03/01/21 Potential to Achieve Goals: Good    Frequency 7X/week     Co-evaluation               AM-PAC PT "6 Clicks" Mobility  Outcome Measure Help needed turning from  your back to your side while in a flat bed without using bedrails?: A Little Help needed moving from lying on your back to sitting on the side of a flat bed without using bedrails?: A Little Help needed moving to and from a bed to a chair (including a wheelchair)?: A Little Help needed standing up from a chair using your arms (e.g., wheelchair or bedside chair)?: A Little Help needed to walk in hospital room?: A Lot Help needed climbing 3-5 steps with a railing? : Total 6 Click Score: 15    End of Session Equipment Utilized During Treatment: Gait belt Activity Tolerance: Patient tolerated treatment well;Patient limited by pain (limited by ongoing urinary incontinence) Patient left: in chair;with nursing/sitter in room Nurse Communication: Mobility status PT Visit Diagnosis: Muscle weakness (generalized) (M62.81);Difficulty in walking, not elsewhere classified (R26.2);Unsteadiness on feet (R26.81);Pain Pain - Right/Left: Right Pain - part of body: Hip    Time: 6314-9702 PT Time Calculation (min) (ACUTE ONLY): 21 min   Charges:   PT Evaluation $PT Eval Moderate Complexity: 1 Mod PT Treatments $Therapeutic Activity: 8-22 mins        Lavone Nian, PT, DPT 02/15/21, 11:23 AM   Waunita Schooner 02/15/2021, 11:04 AM

## 2021-02-15 NOTE — Hospital Course (Addendum)
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  Imaging of the hip including MRI was consistent with muscle strain and likely bursitis.    Patient was started on empiric antibiotics on admission due to leukocytosis, tachycardia and concern for possible UTI and/or pneumonia.  Blood cultures subsequently positive for MSSA.    Port removed on 02/18/2021.  Admitted with sepsis due to MSSA bacteremia.  Patient again with positive blood cultures on 02/20/2021.  Antibiotic switched over to nafcillin.  Small abscess seen on MRI of the lumbar spine on 02/22/2021.  Too small to drain at this point.  Repeat blood cultures drawn on 02/24/2021.  If negative for 2 days can have PICC line placed.

## 2021-02-15 NOTE — Progress Notes (Signed)
Arrived to room to remove TpA from [facility] that had been placed earlier d/t no blood return. IVF running. IVF paused. Found PAC to have GBR and flushed easily. LPN Estill Bamberg had checked PAC  and found GBR  and she started IVF at that time.

## 2021-02-16 DIAGNOSIS — Z593 Problems related to living in residential institution: Secondary | ICD-10-CM

## 2021-02-16 DIAGNOSIS — D72829 Elevated white blood cell count, unspecified: Secondary | ICD-10-CM

## 2021-02-16 DIAGNOSIS — E119 Type 2 diabetes mellitus without complications: Secondary | ICD-10-CM

## 2021-02-16 DIAGNOSIS — E785 Hyperlipidemia, unspecified: Secondary | ICD-10-CM

## 2021-02-16 LAB — CBC
HCT: 25.1 % — ABNORMAL LOW (ref 36.0–46.0)
Hemoglobin: 8.2 g/dL — ABNORMAL LOW (ref 12.0–15.0)
MCH: 30.3 pg (ref 26.0–34.0)
MCHC: 32.7 g/dL (ref 30.0–36.0)
MCV: 92.6 fL (ref 80.0–100.0)
Platelets: 355 10*3/uL (ref 150–400)
RBC: 2.71 MIL/uL — ABNORMAL LOW (ref 3.87–5.11)
RDW: 14.1 % (ref 11.5–15.5)
WBC: 11.3 10*3/uL — ABNORMAL HIGH (ref 4.0–10.5)
nRBC: 0 % (ref 0.0–0.2)

## 2021-02-16 LAB — COMPREHENSIVE METABOLIC PANEL
ALT: 60 U/L — ABNORMAL HIGH (ref 0–44)
AST: 75 U/L — ABNORMAL HIGH (ref 15–41)
Albumin: 1.7 g/dL — ABNORMAL LOW (ref 3.5–5.0)
Alkaline Phosphatase: 61 U/L (ref 38–126)
Anion gap: 8 (ref 5–15)
BUN: 12 mg/dL (ref 8–23)
CO2: 28 mmol/L (ref 22–32)
Calcium: 8.2 mg/dL — ABNORMAL LOW (ref 8.9–10.3)
Chloride: 91 mmol/L — ABNORMAL LOW (ref 98–111)
Creatinine, Ser: 0.59 mg/dL (ref 0.44–1.00)
GFR, Estimated: >60 mL/min (ref >60)
Glucose, Bld: 175 mg/dL — ABNORMAL HIGH (ref 70–99)
Potassium: 4.1 mmol/L (ref 3.5–5.1)
Sodium: 127 mmol/L — ABNORMAL LOW (ref 135–145)
Total Bilirubin: 0.6 mg/dL (ref 0.3–1.2)
Total Protein: 6 g/dL — ABNORMAL LOW (ref 6.5–8.1)

## 2021-02-16 LAB — GLUCOSE, CAPILLARY
Glucose-Capillary: 187 mg/dL — ABNORMAL HIGH (ref 70–99)
Glucose-Capillary: 234 mg/dL — ABNORMAL HIGH (ref 70–99)
Glucose-Capillary: 172 mg/dL — ABNORMAL HIGH (ref 70–99)
Glucose-Capillary: 211 mg/dL — ABNORMAL HIGH (ref 70–99)

## 2021-02-16 LAB — PROCALCITONIN: Procalcitonin: 0.23 ng/mL

## 2021-02-16 LAB — CALCIUM, IONIZED: Calcium, Ionized, Serum: 4.7 mg/dL (ref 4.5–5.6)

## 2021-02-16 LAB — TSH: TSH: 0.528 u[IU]/mL (ref 0.350–4.500)

## 2021-02-16 LAB — MAGNESIUM: Magnesium: 1.8 mg/dL (ref 1.7–2.4)

## 2021-02-16 MED ORDER — IBUPROFEN 400 MG PO TABS: 600.0000 mg | ORAL_TABLET | Freq: Once | ORAL | Status: DC

## 2021-02-16 MED ORDER — OXYCODONE HCL 5 MG PO TABS
5.0000 mg | ORAL_TABLET | Freq: Four times a day (QID) | ORAL | Status: DC | PRN
Start: 2021-02-16 — End: 2021-02-28
  Administered 2021-02-16 – 2021-02-26 (×21): 5 mg via ORAL
  Filled 2021-02-16 (×22): qty 1

## 2021-02-16 NOTE — Assessment & Plan Note (Addendum)
Resolved with replacement.

## 2021-02-16 NOTE — Assessment & Plan Note (Deleted)
RULED OUT.  Initial CXR in ED felt possible PNA, but subsequent 2 view CXR ruled it out.  Initially on Rocephin Zithromax empirically.  Pt denies respiratory symptoms. --On Ancef for MSSA bacteremia.

## 2021-02-16 NOTE — Assessment & Plan Note (Deleted)
Resolved on antibiotics.   --Monitor CBC

## 2021-02-16 NOTE — Assessment & Plan Note (Deleted)
Ruled out.  Corrected calcium is within normal limits.

## 2021-02-16 NOTE — Assessment & Plan Note (Addendum)
Stable.  Continue sliding scale Novolog. Adjust insulin for goal inpatient 140-180.

## 2021-02-16 NOTE — Assessment & Plan Note (Deleted)
On statin.

## 2021-02-16 NOTE — Assessment & Plan Note (Addendum)
Continue Tegretol 

## 2021-02-16 NOTE — Assessment & Plan Note (Addendum)
Continue replacement orally.

## 2021-02-16 NOTE — Assessment & Plan Note (Addendum)
Currently no pain in the right hip this has improved.

## 2021-02-16 NOTE — Assessment & Plan Note (Deleted)
Stable.  Appears not on bladder medications.

## 2021-02-16 NOTE — Assessment & Plan Note (Addendum)
Follows with Dr. Tasia Catchings as outpatient.  Port removed on 02/18/2021.

## 2021-02-16 NOTE — Assessment & Plan Note (Addendum)
Sodium 133 on  02/24/2021

## 2021-02-16 NOTE — Progress Notes (Signed)
°   02/16/21 0038  Assess: MEWS Score  Temp (!) 101.8 F (38.8 C)  BP 134/70  Pulse Rate (!) 108  Resp 20  SpO2 90 %  O2 Device Room Air  Assess: MEWS Score  MEWS Temp 2  MEWS Systolic 0  MEWS Pulse 1  MEWS RR 0  MEWS LOC 0  MEWS Score 3  MEWS Score Color Yellow  Assess: if the MEWS score is Yellow or Red  Were vital signs taken at a resting state? Yes  Focused Assessment No change from prior assessment  Does the patient meet 2 or more of the SIRS criteria? Yes  Does the patient have a confirmed or suspected source of infection? Yes  Provider and Rapid Response Notified? No  MEWS guidelines implemented *See Row Information* Yes  Take Vital Signs  Increase Vital Sign Frequency  Yellow: Q 2hr X 2 then Q 4hr X 2, if remains yellow, continue Q 4hrs  Escalate  MEWS: Escalate Yellow: discuss with charge nurse/RN and consider discussing with provider and RRT  Notify: Charge Nurse/RN  Name of Charge Nurse/RN Notified Lorriane Shire RN  Date Charge Nurse/RN Notified 02/16/21  Time Charge Nurse/RN Notified 0038  Notify: Provider  Provider Name/Title Sharion Settler Np  Date Provider Notified 02/16/21  Time Provider Notified 0107  Notification Type Page  Notification Reason Other (Comment) (Yellow MEWS)  Provider response See new orders  Date of Provider Response 02/16/21  Time of Provider Response [phone]  Document  Patient Outcome Stabilized after interventions  Progress note created (see row info) Yes  Assess: SIRS CRITERIA  SIRS Temperature  1  SIRS Pulse 1  SIRS Respirations  0  SIRS WBC 0  SIRS Score Sum  2

## 2021-02-16 NOTE — Assessment & Plan Note (Addendum)
Currently on continuous nafcillin.  Unfortunately she has another positive blood culture from the repeat cultures on 02/20/2021.  TEE negative.  Port removed on 02/18/2021 and culture tip positive for MSSA.  Repeat blood cultures drawn this morning on 02/24/2021.  Blood cultures will have to remain negative for 2 days prior to placing PICC line.  Will need 6 weeks of IV antibiotics.  MRI of the lumbar spine shows a small paraspinal abscess.  Neurosurgery and interventional radiology recommended medical management at this point..  Transitional care team to check with rehab facility on whether they can do a continuous nafcillin drip which would probably be easier than every 4 dosing.

## 2021-02-16 NOTE — Assessment & Plan Note (Addendum)
Present on admission with MSSA bacteremia, leukocytosis and fever.  Repeating blood cultures again on 02/24/2021.

## 2021-02-16 NOTE — Assessment & Plan Note (Addendum)
Continue lisinopril and Norvasc.  Continue increased dose Toprol-XL to 50 mg at night with blood pressure still being a little high.

## 2021-02-16 NOTE — Assessment & Plan Note (Addendum)
BMI 34.96

## 2021-02-16 NOTE — Evaluation (Signed)
Occupational Therapy Evaluation Patient Details Name: Tamara Mejia MRN: [id number] DOB: 11-Feb-1950 Today's Date: 02/16/2021   History of Present Illness Pt is a 71 y/o F admitted on 02/14/21 with c/c of R hip pain. Pt is admitted for work-up. MRI findings consistent with muscular strain and  associated obturator internus bursitis. Blood cultures positive for MSSA. 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 Prior to hospital admission, pt was living at a group home and reports being able to perform basic ADL and mobility without assist. Pt received in recliner, endorsing large amount of pain with any movement in R hip, medium amount of pain at rest (pt demo'd difficulty with 10-point scale for pain). RN notified. Pt declined ADL transfer attempts 2/2 pain. Currently pt demonstrates impairments as described below (See OT problem list) which functionally limit her ability to perform ADL/self-care tasks. Pt currently requires MOD A for LB ADL 2/2 R hip pain. Pt would benefit from skilled OT services to address noted impairments and functional limitations (see below for any additional details) in order to maximize safety and independence while minimizing falls risk and caregiver burden. Upon hospital discharge, recommend STR to maximize pt safety and return to PLOF.       Recommendations for follow up therapy are one component of a multi-disciplinary discharge planning process, led by the attending physician.  Recommendations may be updated based on patient status, additional functional criteria and insurance authorization.   Follow Up Recommendations  Skilled nursing-short term rehab (<3 hours/day)    Assistance Recommended at Discharge Frequent or constant Supervision/Assistance  Patient can return home  with the following A lot of help with walking and/or transfers;A lot of help with bathing/dressing/bathroom;Direct supervision/assist for medications management;Assist for transportation;Assistance with cooking/housework;Help with stairs or ramp for entrance    Functional Status Assessment  Patient has had a recent decline in their functional status and demonstrates the ability to make significant improvements in function in a reasonable and predictable amount of time.  Equipment Recommendations  Other (comment);BSC/3in1 (defer to next venue)    Recommendations for Other Services       Precautions / Restrictions Precautions Precautions: Fall Restrictions Weight Bearing Restrictions: Yes RLE Weight Bearing: Weight bearing as tolerated      Mobility Bed Mobility               General bed mobility comments: NT, up in recliner at start and end of session    Transfers                   General transfer comment: Pt declined 2/2 pain, required MIN A with PT during previous session. Will continue to assess.      Balance Overall balance assessment: Needs assistance Sitting-balance support: Feet supported, No upper extremity supported Sitting balance-Leahy Scale: Good                                     ADL either performed or assessed with clinical judgement   ADL Overall ADL's : Needs assistance/impaired                                       General  ADL Comments: Pt currently requires MOD A for LB ADL tasks primarily limited by R hip pain, unable to tolerate ADL transfer attempts 2/2 pain.     Vision         Perception     Praxis      Pertinent Vitals/Pain Pain Assessment Pain Assessment: 0-10 Pain Score: 10-Worst pain ever Pain Location: R hip with limited movement Pain Descriptors / Indicators: Discomfort, Grimacing, Guarding Pain Intervention(s): Limited activity within patient's tolerance, Monitored during session,  Repositioned, Patient requesting pain meds-RN notified, RN gave pain meds during session     Hand Dominance     Extremity/Trunk Assessment Upper Extremity Assessment Upper Extremity Assessment: Overall WFL for tasks assessed   Lower Extremity Assessment Lower Extremity Assessment: RLE deficits/detail RLE: Unable to fully assess due to pain   Cervical / Trunk Assessment Cervical / Trunk Assessment: Normal   Communication Communication Communication: No difficulties   Cognition Arousal/Alertness: Awake/alert Behavior During Therapy: WFL for tasks assessed/performed Overall Cognitive Status: History of cognitive impairments - at baseline                                 General Comments: increased time for simple commands     General Comments      Exercises Other Exercises Other Exercises: OT assisted pt in ordering dinner (pt typically has assist for this)   Shoulder Instructions      Home Living Family/patient expects to be discharged to:: Group home                                        Prior Functioning/Environment Prior Level of Function : Independent/Modified Independent;Patient poor historian/Family not available             Mobility Comments: Independent without AD, 1 fall sliding out of char recently ADLs Comments: independent        OT Problem List: Decreased strength;Pain;Decreased safety awareness;Impaired balance (sitting and/or standing);Decreased knowledge of use of DME or AE      OT Treatment/Interventions: Self-care/ADL training;Therapeutic exercise;Therapeutic activities;DME and/or AE instruction;Patient/family education;Balance training    OT Goals(Current goals can be found in the care plan section) Acute Rehab OT Goals Patient Stated Goal: have less hip pain OT Goal Formulation: With patient Time For Goal Achievement: 03/02/21 Potential to Achieve Goals: Good ADL Goals Pt Will Perform Upper Body Dressing:  sitting;with modified independence Pt Will Perform Lower Body Dressing: with set-up;with supervision;with adaptive equipment;sit to/from stand Pt Will Transfer to Toilet: with supervision;ambulating;bedside commode (LRAD PRN) Pt Will Perform Toileting - Clothing Manipulation and hygiene: with modified independence  OT Frequency: Min 2X/week    Co-evaluation              AM-PAC OT "6 Clicks" Daily Activity     Outcome Measure Help from another person eating meals?: None Help from another person taking care of personal grooming?: A Little Help from another person toileting, which includes using toliet, bedpan, or urinal?: A Lot Help from another person bathing (including washing, rinsing, drying)?: A Lot Help from another person to put on and taking off regular upper body clothing?: A Little Help from another person to put on and taking off regular lower body clothing?: A Lot 6 Click Score: 16   End of Session Nurse Communication: Patient requests pain meds  Activity Tolerance: Patient  limited by pain Patient left: in chair;with call bell/phone within reach;with chair alarm set;with nursing/sitter in room  OT Visit Diagnosis: Other abnormalities of gait and mobility (R26.89);Pain Pain - Right/Left: Right Pain - part of body: Hip                Time: 4718-5501 OT Time Calculation (min): 14 min Charges:  OT General Charges $OT Visit: 1 Visit OT Evaluation $OT Eval Moderate Complexity: 1 Mod  Ardeth Perfect., MPH, MS, OTR/L ascom [phone] 02/16/21, 3:17 PM

## 2021-02-16 NOTE — Progress Notes (Signed)
PROGRESS NOTE    Tamara Mejia  PJK:[id number] DOB: 15-Mar-1950 DOA: 02/14/2021 PCP: Kirk Ruths, MD  Brief Narrative:   Ms. 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   Vitals in the emergency department showed temperature of 97.3, heart rate of 107, respiration rate of 18, initial blood pressure of 87/67, improved to 133/86, SPO2 of 98% on room air.  Serum sodium 123, potassium 3.4, chloride of 86, bicarb 25, BUN of 15, serum creatinine of 0.81, nonfasting blood glucose 285, GFR greater than 60, WBC was elevated at 16.5, hemoglobin 9.2, platelets of 328.   No medical treatment was initiated in the emergency department.   At bedside, she is able to tell me her name, age, current location of hospital and current calender year of 11.  She is able to tell me that her caregiver is at bedside.  She states her leg started hurting on Friday.    Caregiver denies known sick contacts, cough.   She denies new cough, chest pain, nausea, vomiting, abdominal pain, burning or pain when she pees, diarrhea.   Caregiver did endorse that patient had slid down her chair onto her right side a couple of days ago.  However caregiver states that her leg was hurting prior to that.   Social history: She lives at a group home.   ROS: Unable to fully complete as patient has learning disability/cognitive dysfunction.   ED Course: Discussed with emergency medicine provider, patient requiring hospitalization for chief concerns of hyponatremia.  Principal Problem:   right Hip pain Active Problems:   MSSA bacteremia    Hyponatremia   Leukocytosis   Sepsis ([place])   DMII (diabetes mellitus, type 2) ([place])   HTN (hypertension), benign   Hyperlipidemia   OAB (overactive bladder)   Seizure (HCC)   Severe obesity (BMI 35.0-35.9 with comorbidity) (HCC)   Hypokalemia   Hypocalcemia   Hypomagnesemia   Malignant neoplasm of left breast in female, estrogen receptor positive (HCC)   Pneumonia   Abnormal urinalysis   Tachycardia   Lives in group home   Assessment & Plan:   Hyponatremia - Moderate to severe, etiology work-up in progress - Corrected serum sodium with glucose is 127, [place], 1999 - Check urine osmolality and urine sodium - BMP in the a.m.  Leukocytosis - Etiology work-up in progress - Check blood cultures x2, procalcitonin, UA, Legionella urine antigen - CBC in a.m.  Hypomagnesemia - Replace with magnesium 2 grams IV - Check magnesium level in the a.m.  right Hip pain - Ketorolac 15 mg IV every 6 hours as needed for moderate pain, 1 day ordered  Hypokalemia - Replace with potassium chloride p.o.  Hypocalcemia - Replace with Tums p.o. - Check ionized calcium  Hyperlipidemia - Resume simvastatin 20 mg nightly  HTN (hypertension), benign - Amlodipine 5 mg daily with breakfast, hydrochlorothiazide 12.5 mg daily, lisinopril 20 mg daily  DMII (diabetes mellitus, type 2) (HCC) - Resumed metformin 500 mg p.o. twice daily - Insulin SSI with at bedtime coverage ordered  Pneumonia - Community acquired pneumonia - Ordered azithromycin and ceftriaxone IV, for 5 days   Sepsis ([place]) - LR 1 L bolus ordered with instructions  continue sodium chloride 75 mL/h   right Hip pain Admitted for concern of right hip pain.  Patient has MSSA bacteremia.  Unclear if the patient has a septic hip.  MRI was negative for any fluid collections within the joint.  Etiology of her MSSA bacteremia still unclear.  Infectious disease involved.  MSSA bacteremia Patient taken off of IV Rocephin.  Switch  to Ancef.  ID following.  Echo pending.  May need transesophageal echo to rule out endocarditis.  Await further ID recommendations.  Hyponatremia Unclear the etiology of her hyponatremia.  Check TSH.  Was on Tegretol and HCTZ prior to admission.  Continue gentle hydration.  Repeat BMP in the morning.  Leukocytosis Improving on antibiotics.  Leukocytosis near resolved.  Sepsis ([place]) Secondary to leukocytosis and fever.  Patient has positive blood cultures for MSSA bacteremia.  Lactic acidosis negative.  DMII (diabetes mellitus, type 2) (HCC) Stable.  Continue sliding scale.  HTN (hypertension), benign Stable.  Hyperlipidemia On statin.  OAB (overactive bladder) Stable.  Seizure ([place]) Continue Tegretol.  Could be the cause of her hyponatremia.  Patient appears asymptomatic from her hyponatremia.  Severe obesity (BMI 35.0-35.9 with comorbidity) (HCC) Chronic.  Hypokalemia Resolved.  Hypocalcemia Stable.  Corrected calcium is 10.0  Hypomagnesemia Resolved.  Malignant neoplasm of left breast in female, estrogen receptor positive ([place]) Stable.   DVT prophylaxis: Lovenox   Code Status: Full Code Family Communication: no family at bedside Disposition Plan: return to group home  Consultants:  ID  Procedures:  none  Antimicrobials:  IV Ancef    Subjective: Febrile to 101.8 this AM(0038). On IV ancef per ID recs. Awaiting echo. Pt still with some pain in her right hip with internal/external rotation. MRI hip negative intra-articular fluid collection.  Objective: Vitals:   02/16/21 0050 02/16/21 0144 02/16/21 0241 02/16/21 0438  BP:   (!) 142/74 (!) 145/74  Pulse:   99 98  Resp:   20 18  Temp:  99.5 F (37.5 C) 99.2 F (37.3 C) 98.7 F (37.1 C)  TempSrc:    Oral  SpO2: 92%  99% 99%  Weight:      Height:        Intake/Output Summary (Last 24 hours) at 02/16/2021 0831 Last data filed at 02/16/2021 0438 Gross per 24 hour  Intake 343.25 ml  Output 600 ml   Net -256.75 ml   Filed Weights   02/14/21 1430  Weight: 83.9 kg    Examination:  Physical Exam Vitals and nursing note reviewed.  Constitutional:      General: She is not in acute distress.    Appearance: She is obese. She is not ill-appearing, toxic-appearing or diaphoretic.  HENT:     Head: Normocephalic and atraumatic.     Nose: Nose normal.  Eyes:     General: No scleral icterus. Cardiovascular:     Rate and Rhythm: Normal rate and regular rhythm.  Pulmonary:     Effort: Pulmonary effort is normal. No respiratory distress.     Breath sounds: No wheezing or rales.  Abdominal:     General: Bowel sounds are normal. There is no distension.     Palpations: Abdomen is soft.     Tenderness: There is no abdominal tenderness. There is no guarding.  Musculoskeletal:     Comments: Pt complains of right hip pain with internal and external rotation of hip  Skin:    General: Skin is warm and dry.     Capillary Refill: Capillary refill takes  less than 2 seconds.  Neurological:     Mental Status: She is oriented to person, place, and time.    Data Reviewed: I have personally reviewed following labs and imaging studies  CBC: Recent Labs  Lab 02/14/21 1433 02/15/21 0021 02/16/21 0528  WBC 16.5* 12.3* 11.3*  HGB 9.2* 8.3* 8.2*  HCT 27.5* 24.2* 25.1*  MCV 89.9 89.3 92.6  PLT 328 320 937   Basic Metabolic Panel: Recent Labs  Lab 02/14/21 1433 02/14/21 2120 02/15/21 0021 02/15/21 0816 02/16/21 0528  NA 123*  --  123* 127* 127*  K 3.4*  --  3.5 3.7 4.1  CL 86*  --  86* 92* 91*  CO2 25  --  28 28 28   GLUCOSE 285*  --  296* 201* 175*  BUN 15  --  14 12 12   CREATININE 0.81  --  0.71 0.69 0.59  CALCIUM 8.0*  --  8.1* 7.6* 8.2*  MG  --  1.5*  --  1.9 1.8  PHOS  --  3.1  --   --   --    GFR: Estimated Creatinine Clearance: 66.1 mL/min (by C-G formula based on SCr of 0.59 mg/dL). Liver Function Tests: Recent Labs  Lab 02/14/21 1440 02/16/21 0528  AST 31 75*  ALT  67* 60*  ALKPHOS 67 61  BILITOT 0.6 0.6  PROT 6.5 6.0*  ALBUMIN 2.4* 1.7*   No results for input(s): LIPASE, AMYLASE in the last 168 hours. No results for input(s): AMMONIA in the last 168 hours. Coagulation Profile: No results for input(s): INR, PROTIME in the last 168 hours. Cardiac Enzymes: No results for input(s): CKTOTAL, CKMB, CKMBINDEX, TROPONINI in the last 168 hours. BNP (last 3 results) No results for input(s): PROBNP in the last 8760 hours. HbA1C: No results for input(s): HGBA1C in the last 72 hours. CBG: Recent Labs  Lab 02/15/21 0728 02/15/21 1143 02/15/21 1635 02/15/21 2009 02/16/21 0803  GLUCAP 197* 187* 189* 145* 187*   Lipid Profile: No results for input(s): CHOL, HDL, LDLCALC, TRIG, CHOLHDL, LDLDIRECT in the last 72 hours. Thyroid Function Tests: No results for input(s): TSH, T4TOTAL, FREET4, T3FREE, THYROIDAB in the last 72 hours. Anemia Panel: No results for input(s): VITAMINB12, FOLATE, FERRITIN, TIBC, IRON, RETICCTPCT in the last 72 hours. Sepsis Labs: Recent Labs  Lab 02/14/21 2120 02/15/21 0021 02/16/21 0528  PROCALCITON 0.25 0.22 0.23  LATICACIDVEN 1.5 1.8  --     Recent Results (from the past 240 hour(s))  Resp Panel by RT-PCR (Flu A&B, Covid) Nasopharyngeal Swab     Status: None   Collection Time: 02/14/21  9:16 PM   Specimen: Nasopharyngeal Swab; Nasopharyngeal(NP) swabs in vial transport medium  Result Value Ref Range Status   SARS Coronavirus 2 by RT PCR NEGATIVE NEGATIVE Final    Comment: (NOTE) SARS-CoV-2 target nucleic acids are NOT DETECTED.  The SARS-CoV-2 RNA is generally detectable in upper respiratory specimens during the acute phase of infection. The lowest concentration of SARS-CoV-2 viral copies this assay can detect is 138 copies/mL. A negative result does not preclude SARS-Cov-2 infection and should not be used as the sole basis for treatment or other patient management decisions. A negative result may occur with   improper specimen collection/handling, submission of specimen other than nasopharyngeal swab, presence of viral mutation(s) within the areas targeted by this assay, and inadequate number of viral copies(<138 copies/mL). A negative result must be combined with clinical observations, patient history, and epidemiological information. The expected result is Negative.  Fact Sheet for Patients:  EntrepreneurPulse.com.au  Fact Sheet for Healthcare Providers:  IncredibleEmployment.be  This test is no t yet approved or cleared by the [place] FDA and  has been authorized for detection and/or diagnosis of SARS-CoV-2 by FDA under an Emergency Use Authorization (EUA). This EUA will remain  in effect (meaning this test can be used) for the duration of the COVID-19 declaration under Section 564(b)(1) of the Act, 21 U.S.C.section 360bbb-3(b)(1), unless the authorization is terminated  or revoked sooner.       Influenza A by PCR NEGATIVE NEGATIVE Final   Influenza B by PCR NEGATIVE NEGATIVE Final    Comment: (NOTE) The Xpert Xpress SARS-CoV-2/FLU/RSV plus assay is intended as an aid in the diagnosis of influenza from Nasopharyngeal swab specimens and should not be used as a sole basis for treatment. Nasal washings and aspirates are unacceptable for Xpert Xpress SARS-CoV-2/FLU/RSV testing.  Fact Sheet for Patients: EntrepreneurPulse.com.au  Fact Sheet for Healthcare Providers: IncredibleEmployment.be  This test is not yet approved or cleared by the [place] FDA and has been authorized for detection and/or diagnosis of SARS-CoV-2 by FDA under an Emergency Use Authorization (EUA). This EUA will remain in effect (meaning this test can be used) for the duration of the COVID-19 declaration under Section 564(b)(1) of the Act, 21 U.S.C. section 360bbb-3(b)(1), unless the authorization is terminated  or revoked.  Performed at [facility], [address] [place], [place] [address]   CULTURE, BLOOD (ROUTINE X 2) w Reflex to ID Panel     Status: None (Preliminary result)   Collection Time: 02/14/21  9:20 PM   Specimen: BLOOD  Result Value Ref Range Status   Specimen Description BLOOD RIGHT ANTECUBITAL  Final   Special Requests   Final    BOTTLES DRAWN AEROBIC AND ANAEROBIC Blood Culture adequate volume   Culture  Setup Time   Final    Organism ID to follow GRAM POSITIVE COCCI IN BOTH AEROBIC AND ANAEROBIC BOTTLES CRITICAL RESULT CALLED TO, READ BACK BY AND VERIFIED WITH: SUSAN WATSON ON 02/15/21 AT 6789 QSD Performed at [facility], [address] [place], [place] [address]    Culture GRAM POSITIVE COCCI  Final   Report Status PENDING  Incomplete  CULTURE, BLOOD (ROUTINE X 2) w Reflex to ID Panel     Status: None (Preliminary result)   Collection Time: 02/14/21  9:20 PM   Specimen: BLOOD  Result Value Ref Range Status   Specimen Description BLOOD BLOOD RIGHT HAND  Final   Special Requests   Final    BOTTLES DRAWN AEROBIC AND ANAEROBIC Blood Culture results may not be optimal due to an inadequate volume of blood received in culture bottles   Culture  Setup Time   Final    GRAM POSITIVE COCCI IN BOTH AEROBIC AND ANAEROBIC BOTTLES CRITICAL RESULT CALLED TO, READ BACK BY AND VERIFIED WITH: [place] ON 02/15/21 AT 7510 Performed at [facility], [address] [place], [place] [address]    Culture GRAM POSITIVE COCCI  Final   Report Status PENDING  Incomplete  Blood Culture ID Panel (Reflexed)     Status: Abnormal   Collection Time: 02/14/21  9:20 PM  Result Value Ref Range Status   Enterococcus faecalis NOT DETECTED NOT DETECTED Final   Enterococcus Faecium NOT DETECTED NOT DETECTED Final   Listeria monocytogenes NOT DETECTED NOT DETECTED Final   Staphylococcus species DETECTED (A) NOT DETECTED Final    Comment: CRITICAL RESULT CALLED  TO,  READ BACK BY AND VERIFIED WITH: SUSAN WATSON ON 02/15/21 AT 0959 QSD    Staphylococcus aureus (BCID) DETECTED (A) NOT DETECTED Final    Comment: CRITICAL RESULT CALLED TO, READ BACK BY AND VERIFIED WITH: SUSAN WATSON ON 02/15/21 AT 0959 QSD    Staphylococcus epidermidis NOT DETECTED NOT DETECTED Final   Staphylococcus lugdunensis NOT DETECTED NOT DETECTED Final   Streptococcus species NOT DETECTED NOT DETECTED Final   Streptococcus agalactiae NOT DETECTED NOT DETECTED Final   Streptococcus pneumoniae NOT DETECTED NOT DETECTED Final   Streptococcus pyogenes NOT DETECTED NOT DETECTED Final   A.calcoaceticus-baumannii NOT DETECTED NOT DETECTED Final   Bacteroides fragilis NOT DETECTED NOT DETECTED Final   Enterobacterales NOT DETECTED NOT DETECTED Final   Enterobacter cloacae complex NOT DETECTED NOT DETECTED Final   Escherichia coli NOT DETECTED NOT DETECTED Final   Klebsiella aerogenes NOT DETECTED NOT DETECTED Final   Klebsiella oxytoca NOT DETECTED NOT DETECTED Final   Klebsiella pneumoniae NOT DETECTED NOT DETECTED Final   Proteus species NOT DETECTED NOT DETECTED Final   Salmonella species NOT DETECTED NOT DETECTED Final   Serratia marcescens NOT DETECTED NOT DETECTED Final   Haemophilus influenzae NOT DETECTED NOT DETECTED Final   Neisseria meningitidis NOT DETECTED NOT DETECTED Final   Pseudomonas aeruginosa NOT DETECTED NOT DETECTED Final   Stenotrophomonas maltophilia NOT DETECTED NOT DETECTED Final   Candida albicans NOT DETECTED NOT DETECTED Final   Candida auris NOT DETECTED NOT DETECTED Final   Candida glabrata NOT DETECTED NOT DETECTED Final   Candida krusei NOT DETECTED NOT DETECTED Final   Candida parapsilosis NOT DETECTED NOT DETECTED Final   Candida tropicalis NOT DETECTED NOT DETECTED Final   Cryptococcus neoformans/gattii NOT DETECTED NOT DETECTED Final   Meth resistant mecA/C and MREJ NOT DETECTED NOT DETECTED Final    Comment: Performed at [facility],  [address] [place], [place] [address]     Radiology Studies: DG Chest 2 View  Result Date: 02/14/2021 CLINICAL DATA:  Abnormal chest x-ray, weak, hyponatremia EXAM: CHEST - 2 VIEW COMPARISON:  02/14/2021, 12/31/2020 FINDINGS: Frontal and lateral views of the chest demonstrates stable right chest wall port. Cardiac silhouette is unremarkable. Patient is slightly rotated to the right, accentuating the vascular markings at the right lung base. No airspace disease identified on lateral view. No effusion or pneumothorax. No acute bony abnormalities. IMPRESSION: 1. No acute airspace disease. 2. Densities in the medial right lung base on prior exam correspond to prominent vascular shadows and superimposed structures on this study. Electronically Signed   By: Randa Ngo M.D.   On: 02/14/2021 18:42   MR HIP RIGHT WO CONTRAST  Result Date: 02/14/2021 CLINICAL DATA:  Hip pain for 1 week, pain with ambulation EXAM: MR OF THE RIGHT HIP WITHOUT CONTRAST TECHNIQUE: Multiplanar, multisequence MR imaging was performed. No intravenous contrast was administered. COMPARISON:  02/14/2021 FINDINGS: Bones: There are no acute or destructive bony lesions. No abnormal signal. Articular cartilage and labrum Articular cartilage:  No gross abnormalities. Labrum:  Limited visualization. Joint or bursal effusion Joint effusion:  Trace symmetrical bilateral hip effusions. Bursae: There is a small amount of fluid medial to the right ischial tuberosity near the insertion of the obturator internus muscle, which may reflect bursitis. Muscles and tendons Muscles and tendons: Minimal edema within the insertion of the right obturator internus muscle near the ischial tuberosity. Fluid signal along the shield tuberosities may also reflect superimposed bursitis. Remaining muscular structures are unremarkable. Other findings Miscellaneous:  Visualized intrapelvic structures are unremarkable. IMPRESSION: 1. Minimal fluid signal within the  insertion of the right obturator internus muscle, as well as along the medial margin of the right ischial tuberosity. Findings consistent with muscular strain and associated obturator internus bursitis. 2. No acute or destructive bony lesions. Electronically Signed   By: Randa Ngo M.D.   On: 02/14/2021 20:25   DG Chest Portable 1 View  Result Date: 02/14/2021 CLINICAL DATA:  Weak and hyponatremic EXAM: PORTABLE CHEST 1 VIEW.  Patient is slightly rotated. COMPARISON:  Chest x-ray 12/31/2020. FINDINGS: Right chest wall Port-A-Cath with tip overlying the expected region of the distal superior vena cava just proximal to the superior cavoatrial junction. Poorly visualized right heart border. Otherwise the heart and mediastinal contours are unchanged. Aortic calcification. Query right middle lobe opacity. No pulmonary edema. No pleural effusion. No pneumothorax. No acute osseous abnormality. IMPRESSION: 1. Query right middle lobe opacity. Recommend repeat PA and lateral view of the chest. 2.  Aortic Atherosclerosis (ICD10-I70.0). Electronically Signed   By: Iven Finn M.D.   On: 02/14/2021 17:59   DG Hip Unilat W or Wo Pelvis 2-3 Views Right  Result Date: 02/14/2021 CLINICAL DATA:  Right hip pain EXAM: DG HIP (WITH OR WITHOUT PELVIS) 2-3V RIGHT COMPARISON:  None. FINDINGS: No recent fracture or dislocation is seen. Degenerative changes are noted with small bony spurs. Degenerative changes are noted in the pubic symphysis and visualized lower lumbar spine. IMPRESSION: No recent fracture or dislocation is seen in the right hip. Degenerative changes with small bony spurs are noted in the right hip. Electronically Signed   By: Elmer Picker M.D.   On: 02/14/2021 15:39     Scheduled Meds:  amLODipine  5 mg Oral Q breakfast   aspirin EC  81 mg Oral Q breakfast   carbamazepine  200 mg Oral TID   Chlorhexidine Gluconate Cloth  6 each Topical Daily   enoxaparin (LOVENOX) injection  40 mg  Subcutaneous Q24H   insulin aspart  0-20 Units Subcutaneous TID WC   insulin aspart  0-5 Units Subcutaneous QHS   lisinopril  20 mg Oral Q breakfast   simvastatin  20 mg Oral QPM   Continuous Infusions:   ceFAZolin (ANCEF) IV 2 g (02/16/21 0520)   lactated ringers 75 mL/hr at 02/16/21 0518     LOS: 1 day    Time spent: 32 mins    Kristopher Oppenheim, DO  [facility]  02/16/2021, 8:31 AM

## 2021-02-16 NOTE — Progress Notes (Signed)
Physical Therapy Treatment Patient Details Name: Tamara Mejia MRN: [id number] DOB: 07-11-1950 Today's Date: 02/16/2021   History of Present Illness Pt is a 71 y/o F admitted on 02/14/21 with c/c of R hip pain. Pt is admitted for work-up. MRI findings consistent with muscular strain and  associated obturator internus bursitis. Blood cultures positive for MSSA. 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 Pt attempts to roll L<>R but is limited 2/2 pain to allow PT to don pad & mesh underwear from bed level. Pt requires mod assist to come to sitting EOB & min assist for sit>stand & to step to recliner. Pt does attempt gait but is only able to take 2 steps forward with RW (PT brings chair along behind her for safety) with significantly impaired gait pattern as noted below. Pt performs RLE strengthening exercises with cuing for technique. Continue to recommend STR upon d/c.     Recommendations for follow up therapy are one component of a multi-disciplinary discharge planning process, led by the attending physician.  Recommendations may be updated based on patient status, additional functional criteria and insurance authorization.  Follow Up Recommendations  Skilled nursing-short term rehab (<3 hours/day)     Assistance Recommended at Discharge Frequent or constant Supervision/Assistance  Patient can return home with the following A lot of help with walking and/or transfers;A lot of help with bathing/dressing/bathroom;Assist for transportation;Assistance with cooking/housework;Direct supervision/assist for financial management;Help with stairs or ramp for entrance;Direct supervision/assist for medications management    Equipment Recommendations  Rolling walker (2 wheels);BSC/3in1    Recommendations for Other Services       Precautions / Restrictions Precautions Precautions: Fall Restrictions Weight Bearing Restrictions: Yes RLE Weight Bearing: Weight bearing as tolerated     Mobility  Bed Mobility Overal bed mobility: Needs Assistance Bed Mobility: Supine to Sit, Rolling Rolling:  (Pt with limited ability to roll L<>R 2/2 R hip pain)   Supine to sit: Mod assist, HOB elevated     General bed mobility comments: assistance to move RLE>LLE to EOB, HOB elevated, bed rails.    Transfers Overall transfer level: Needs assistance Equipment used: Rolling walker (2 wheels) Transfers: Sit to/from Stand, Bed to chair/wheelchair/BSC Sit to Stand: Min assist   Step pivot transfers: Min assist       General transfer comment: Cuing for safe hand placement to push to standing & hand placement when holding RW.    Ambulation/Gait Ambulation/Gait assistance: Min assist Gait Distance (Feet): 2 Feet Assistive device: Rolling walker (2 wheels) Gait Pattern/deviations: Decreased step length - left, Decreased step length - right, Decreased stride length, Decreased weight shift to right, Trunk flexed, Decreased dorsiflexion - right Gait velocity: significanlty decreased     General Gait Details: Cuing but poor demo for upright posture, decreased step length BLE, decreased foot clearance & heel strike RLE.   Stairs             Wheelchair Mobility    Modified Rankin (Stroke Patients Only)       Balance Overall balance assessment: Needs assistance Sitting-balance support: Feet supported, Bilateral upper extremity supported Sitting balance-Leahy Scale: Good Sitting balance - Comments: supervision static sitting   Standing balance support: Bilateral upper extremity supported, During functional activity Standing  balance-Leahy Scale: Poor                               Cognition Arousal/Alertness: Awake/alert Behavior During Therapy: WFL for tasks assessed/performed Overall Cognitive Status: History of cognitive impairments - at baseline                                 General Comments: Able to follow simple commands with extra time during session.        Exercises General Exercises - Lower Extremity Long Arc Quad: AROM, Strengthening, Right, 10 reps, Seated Hip Flexion/Marching: AROM, Strengthening, Right, 10 reps, Seated    General Comments General comments (skin integrity, edema, etc.): Pt incontinent throughout session with PT assisting with donning pad & mesh underwear but pt still with urinary incontinence on floor. HR 112-115 bpm during session.      Pertinent Vitals/Pain Pain Assessment Pain Assessment: Faces Faces Pain Scale: Hurts whole lot Pain Location: R hip with even minimal movement Pain Descriptors / Indicators: Discomfort, Grimacing, Guarding Pain Intervention(s): Repositioned, Monitored during session, Limited activity within patient's tolerance    Home Living                          Prior Function            PT Goals (current goals can now be found in the care plan section) Acute Rehab PT Goals Patient Stated Goal: decreased pain PT Goal Formulation: With patient Time For Goal Achievement: 03/01/21 Potential to Achieve Goals: Fair Progress towards PT goals: Progressing toward goals    Frequency    7X/week      PT Plan Current plan remains appropriate    Co-evaluation              AM-PAC PT "6 Clicks" Mobility   Outcome Measure  Help needed turning from your back to your side while in a flat bed without using bedrails?: A Little Help needed moving from lying on your back to sitting on the side of a flat bed without using bedrails?: A Lot Help needed moving to and from a bed to a chair (including a wheelchair)?: A Little Help needed standing up from a chair using your arms  (e.g., wheelchair or bedside chair)?: A Little Help needed to walk in hospital room?: A Lot Help needed climbing 3-5 steps with a railing? : Total 6 Click Score: 14    End of Session Equipment Utilized During Treatment: Gait belt Activity Tolerance: Patient limited by pain;Patient tolerated treatment well (limited by urinary incontinence) Patient left: in chair;with chair alarm set;with call bell/phone within reach Nurse Communication: Patient requests pain meds PT Visit Diagnosis: Muscle weakness (generalized) (M62.81);Difficulty in walking, not elsewhere classified (R26.2);Unsteadiness on feet (R26.81);Pain Pain - Right/Left: Right Pain - part of body: Hip     Time: [id number] PT Time Calculation (min) (ACUTE ONLY): 23 min  Charges:  $Therapeutic Activity: 23-37 mins                     Lavone Nian, PT, DPT 02/16/21, 3:10 PM    Waunita Schooner 02/16/2021, 3:08 PM

## 2021-02-16 NOTE — Subjective & Objective (Signed)
Ms. 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   Vitals in the emergency department showed temperature of 97.3, heart rate of 107, respiration rate of 18, initial blood pressure of 87/67, improved to 133/86, SPO2 of 98% on room air.  Serum sodium 123, potassium 3.4, chloride of 86, bicarb 25, BUN of 15, serum creatinine of 0.81, nonfasting blood glucose 285, GFR greater than 60, WBC was elevated at 16.5, hemoglobin 9.2, platelets of 328.   No medical treatment was initiated in the emergency department.   At bedside, she is able to tell me her name, age, current location of hospital and current calender year of 57.  She is able to tell me that her caregiver is at bedside.  She states her leg started hurting on Friday.    Caregiver denies known sick contacts, cough.   She denies new cough, chest pain, nausea, vomiting, abdominal pain, burning or pain when she pees, diarrhea.   Caregiver did endorse that patient had slid down her chair onto her right side a couple of days ago.  However caregiver states that her leg was hurting prior to that.   Social history: She lives at a group home.   ROS: Unable to fully complete as patient has learning disability/cognitive dysfunction.   ED Course: Discussed with emergency medicine provider, patient requiring hospitalization for chief concerns of hyponatremia.

## 2021-02-17 ENCOUNTER — Telehealth: Payer: Self-pay

## 2021-02-17 ENCOUNTER — Inpatient Hospital Stay (HOSPITAL_COMMUNITY)
Admit: 2021-02-17 | Discharge: 2021-02-17 | Disposition: A | Discharge status: Home / Self Care | Payer: Medicare Other | Attending: Internal Medicine | Admitting: Internal Medicine

## 2021-02-17 DIAGNOSIS — R7881 Bacteremia: Secondary | ICD-10-CM | POA: Diagnosis not present

## 2021-02-17 DIAGNOSIS — M25551 Pain in right hip: Secondary | ICD-10-CM | POA: Diagnosis not present

## 2021-02-17 DIAGNOSIS — B9561 Methicillin susceptible Staphylococcus aureus infection as the cause of diseases classified elsewhere: Secondary | ICD-10-CM | POA: Diagnosis not present

## 2021-02-17 LAB — CBC WITH DIFFERENTIAL/PLATELET
Abs Immature Granulocytes: 0.11 10*3/uL — ABNORMAL HIGH (ref 0.00–0.07)
Basophils Absolute: 0.1 10*3/uL (ref 0.0–0.1)
Basophils Relative: 1 %
Eosinophils Absolute: 0.1 10*3/uL (ref 0.0–0.5)
Eosinophils Relative: 1 %
HCT: 26.1 % — ABNORMAL LOW (ref 36.0–46.0)
Hemoglobin: 8.5 g/dL — ABNORMAL LOW (ref 12.0–15.0)
Immature Granulocytes: 1 %
Lymphocytes Relative: 10 %
Lymphs Abs: 0.9 10*3/uL (ref 0.7–4.0)
MCH: 30.4 pg (ref 26.0–34.0)
MCHC: 32.6 g/dL (ref 30.0–36.0)
MCV: 93.2 fL (ref 80.0–100.0)
Monocytes Absolute: 0.7 10*3/uL (ref 0.1–1.0)
Monocytes Relative: 8 %
Neutro Abs: 7.6 10*3/uL (ref 1.7–7.7)
Neutrophils Relative %: 79 %
Platelets: 395 10*3/uL (ref 150–400)
RBC: 2.8 MIL/uL — ABNORMAL LOW (ref 3.87–5.11)
RDW: 14.1 % (ref 11.5–15.5)
WBC: 9.4 10*3/uL (ref 4.0–10.5)
nRBC: 0 % (ref 0.0–0.2)

## 2021-02-17 LAB — GLUCOSE, CAPILLARY
Glucose-Capillary: 190 mg/dL — ABNORMAL HIGH (ref 70–99)
Glucose-Capillary: 261 mg/dL — ABNORMAL HIGH (ref 70–99)
Glucose-Capillary: 177 mg/dL — ABNORMAL HIGH (ref 70–99)
Glucose-Capillary: 189 mg/dL — ABNORMAL HIGH (ref 70–99)

## 2021-02-17 LAB — ECHOCARDIOGRAM COMPLETE
AR max vel: 1.93 cm2
AV Area VTI: 1.95 cm2
AV Area mean vel: 1.94 cm2
AV Mean grad: 6 mmHg
AV Peak grad: 11.6 mmHg
Ao pk vel: 1.7 m/s
Area-P 1/2: 6.32 cm2
Height: 61 in
MV VTI: 2.27 cm2
S' Lateral: 2.74 cm
Weight: 2960 oz

## 2021-02-17 LAB — COMPREHENSIVE METABOLIC PANEL
ALT: 58 U/L — ABNORMAL HIGH (ref 0–44)
AST: 58 U/L — ABNORMAL HIGH (ref 15–41)
Albumin: 2 g/dL — ABNORMAL LOW (ref 3.5–5.0)
Alkaline Phosphatase: 76 U/L (ref 38–126)
Anion gap: 7 (ref 5–15)
BUN: 10 mg/dL (ref 8–23)
CO2: 29 mmol/L (ref 22–32)
Calcium: 8.3 mg/dL — ABNORMAL LOW (ref 8.9–10.3)
Chloride: 95 mmol/L — ABNORMAL LOW (ref 98–111)
Creatinine, Ser: 0.64 mg/dL (ref 0.44–1.00)
GFR, Estimated: >60 mL/min (ref >60)
Glucose, Bld: 165 mg/dL — ABNORMAL HIGH (ref 70–99)
Potassium: 4.3 mmol/L (ref 3.5–5.1)
Sodium: 131 mmol/L — ABNORMAL LOW (ref 135–145)
Total Bilirubin: 0.4 mg/dL (ref 0.3–1.2)
Total Protein: 6.2 g/dL — ABNORMAL LOW (ref 6.5–8.1)

## 2021-02-17 LAB — HEMOGLOBIN A1C
Hgb A1c MFr Bld: 7.3 % — ABNORMAL HIGH (ref 4.8–5.6)
Mean Plasma Glucose: 163 mg/dL

## 2021-02-17 LAB — MAGNESIUM: Magnesium: 1.5 mg/dL — ABNORMAL LOW (ref 1.7–2.4)

## 2021-02-17 MED ORDER — HYDRALAZINE HCL 25 MG PO TABS
25.0000 mg | ORAL_TABLET | Freq: Four times a day (QID) | ORAL | Status: DC | PRN
  Administered 2021-02-18 – 2021-02-28 (×8): 25 mg via ORAL
  Filled 2021-02-17 (×8): qty 1

## 2021-02-17 MED ORDER — ENSURE MAX PROTEIN PO LIQD
11.0000 [oz_av] | Freq: Two times a day (BID) | ORAL | Status: DC
  Administered 2021-02-17 – 2021-02-24 (×12): 11 [oz_av] via ORAL
  Filled 2021-02-17: qty 330

## 2021-02-17 MED ORDER — SODIUM CHLORIDE 0.9% FLUSH
10.0000 mL | Freq: Two times a day (BID) | INTRAVENOUS | Status: DC
  Administered 2021-02-17 – 2021-02-20 (×4): 10 mL
  Administered 2021-02-21: 20 mL
  Administered 2021-02-22 – 2021-02-27 (×9): 10 mL

## 2021-02-17 MED ORDER — SODIUM CHLORIDE 0.9% FLUSH
10.0000 mL | INTRAVENOUS | Status: DC | PRN
  Administered 2021-02-18: 10 mL

## 2021-02-17 MED ORDER — ADULT MULTIVITAMIN W/MINERALS CH
1.0000 | ORAL_TABLET | Freq: Every day | ORAL | Status: DC
  Administered 2021-02-17 – 2021-02-28 (×11): 1 via ORAL
  Filled 2021-02-17 (×11): qty 1

## 2021-02-17 NOTE — TOC Progression Note (Signed)
Transition of Care [facility]) - Progression Note    Patient Details  Name: Tamara Mejia MRN: [id number] Date of Birth: 1950-09-24  Transition of Care [facility]) CM/SW [place], RN Phone Number: 02/17/2021, 9:10 AM  Clinical Narrative:   The patient lives at [facility] on [address] [place], [place] legal guardian is Geraldo Pitter and Carlota Raspberry I attempted to contact and left a general voice mail for a call back to [phone] and [phone] I called Merlene Morse at [phone] I spoke with Lattie Haw at the main office, The legal guardian is not available due to medical issues,  Lattie Haw will attempt to contact and give me a call back, she stated that if the recommendation is for STR then the patient will need to go there prior to returning to the group home            Expected Discharge Plan and Services                                                 Social Determinants of Health (SDOH) Interventions    Readmission Risk Interventions No flowsheet data found.

## 2021-02-17 NOTE — Assessment & Plan Note (Addendum)
Due to intellectual disability/delay. Legal Guardian is Geraldo Pitter [phone].

## 2021-02-17 NOTE — Progress Notes (Signed)
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  Imaging of the hip including MRI was consistent with muscle strain and likely bursitis.    Patient was started on empiric antibiotics on admission due to leukocytosis, tachycardia and concern for possible UTI and/or pneumonia.  Blood cultures subsequently positive for MSSA.    Admitted with sepsis due to MSSA bacteremia.  On IV antibiotics with further evaluation underway.  Assessment and Plan * right Hip pain- (present on admission) Admitted for concern of right hip pain.  Patient has MSSA bacteremia.  Unclear if the patient has a septic hip.  MRI was negative for any fluid collections within the joint.  Etiology of her MSSA bacteremia still unclear, possibly line infection with her chemo port.  Infectious disease involved. --Pain control --PT/OT --Ambulate as tolerated  Sepsis ([place])- (present on admission) POA as evidenced by leukocytosis and fever in setting of MSSA bacteremia.  Lactic acidosis negative.  Mgmt as outlined. Sepsis physiology remains with fevers, tachycardia and tachynpnea  Pneumonia- (present on admission) Initial CXR in ED felt possible PNA, but subsequent 2 view CXR ruled it out.  Initially on Rocephin Zithromax empirically.   Pt denies respiratory symptoms. --Stop Zithromax.   --On Ancef for MSSA bacteremia.   Hyponatremia- (present on admission) POA,  improving with IV hydration.   TSH normal.  Was on HCTZ, this is held. Also on Tregretol which is continued. --Continue IV fluids --Monitor BMP  Hypomagnesemia- (present on admission) Resolved with replacement. Recurrent 1/30: Mg again 1.5 --2 g IV Mg sulfate to replace --Monitor and replace as needed.  Hypocalcemia- (present on admission) Ruled out.  Corrected calcium is within normal limits.  Hypokalemia- (present on admission) Resolved with replacement. Monitor K and replace as needed.  Abnormal urinalysis- (present on admission) Pt denies dysuria, frequency or other GU symptoms.  On antibiotics for MSSA bacteremia.  Leukocytosis- (present on admission) Resolved on antibiotics.   --Monitor CBC  OAB (overactive bladder)- (present on admission) Stable.  Appears not on bladder medications.  HTN (hypertension), benign- (present on admission) 1/30: BP's elevated yesterday and this AM. --Continue home lisinopril, amlodipine --HCTZ held due to hyponatremia --Monitor BP --PRN hydralazine  DMII (diabetes mellitus, type 2) (HCC) Stable.  Continue sliding scale Novolog. Adjust insulin for goal inpatient 140-180.  Hyperlipidemia- (present on admission) On statin.  Seizure ([place]) Continue Tegretol.  Hyponatremia improving on IV fluids, but Tegretol can also cause it.  Monitor for stability after Na normalized.    Severe obesity (BMI 35.0-35.9 with comorbidity) ([place]) Complicates overall care and prognosis.  Recommend lifestyle modifications including physical activity and diet for weight loss and overall long-term health.   Malignant neoplasm of left breast in female, estrogen receptor positive ([place]) Stable.  Follows with Dr. Tasia Catchings.  Currently on chemotherapy.  Dr. Tasia Catchings notified of this admission. --May need port removed  Lives in group home Due to intellectual disability/delay. Legal Guardian is Geraldo Pitter [phone]. PT/OT recommend SNF/rehab at d/c. Pt hopes to  return  home. Jeannie contacting group home to see if feasible.  MSSA bacteremia- (present on admission) Source to be determined, possibly chemo port line infection.   --Continue Ancef.   --ID consulted, see their recommendations.   --Echo pending.   --May need transesophageal echo to rule out endocarditis.   --Follow repeat cultures  Tachycardia- (present on admission) Due to sepsis.  Treat underlying infection as outlined.  Telemetry.     Subjective: Pt awake sitting up in bed.  Her cousin called her and was on FaceTime during encounter.  Pt denies any specific complaints today.  Says she was hot overnight and did have a fever.  Otherwise her only concern is going home.  Objective Vitals reviewed, notable for Fever 101.6 overnight, remains mildly tachycardic.  BP's elevated.  General exam: awake, alert, no acute distress, obese HEENT: wearing glasses, moist mucus membranes, hearing grossly normal  Respiratory system: CTAB, no wheezes, rales or rhonchi, normal respiratory effort. Cardiovascular system: normal S1/S2, RRR, no pedal edema.   Gastrointestinal system: soft, NT, ND, no HSM felt, +bowel sounds. Central nervous system: no gross focal neurologic deficits, normal speech Extremities: moves all, no edema, normal tone Skin: diaphoretic forehead, intact, normal temperature, no rashes seen on visualized skin Psychiatry: normal mood, congruent affect, abnormal judgement and insight due to intellectual disability   Data Reviewed:  Labs reviewed and notable for Na 131 improving, Cl 95, glucose 165, Ca 8.3, albumin 2.0, ASP 75>>58, ALT 60>>58, Hbg stable 8.5, WBC 11.3>>9.4,   Transthoracic Echo -  1. Left ventricular ejection fraction, by estimation, is 60 to 65%. The  left ventricle has normal function. The left ventricle has no regional  wall motion abnormalities. There is mild left ventricular hypertrophy.  Left ventricular diastolic parameters  are consistent with Grade I  diastolic dysfunction (impaired relaxation).   2. Right ventricular systolic function is normal. The right ventricular  size is normal. Tricuspid regurgitation signal is inadequate for assessing  PA pressure.   3. The mitral valve is normal in structure. No evidence of mitral valve  regurgitation. No evidence of mitral stenosis.   4. The aortic valve is normal in structure. Aortic valve regurgitation is  not visualized. No aortic stenosis is present.   5. No clear vegetations but overall suboptimal study.   Family Communication: Spoke with Geraldo Pitter, legal guardian today and updated on diagnosis and plan.  Please call her with all updates and for all decisions.    Disposition: Status is: Inpatient  Remains inpatient appropriate because: Severity of illness on IV antibiotics and evaluation of bacteremia still ongoing, repeat cultures pending         Time spent: 35 minutes  Author: Ezekiel Slocumb, DO 02/17/2021 1:23 PM  For on call review [email or web address].

## 2021-02-17 NOTE — Assessment & Plan Note (Deleted)
Due to sepsis.  Treat underlying infection as outlined.  Telemetry.

## 2021-02-17 NOTE — Progress Notes (Signed)
Initial Nutrition Assessment  DOCUMENTATION CODES:  Obesity unspecified  INTERVENTION:  Add Ensure Max po BID, each supplement provides 150 kcal and 30 grams of protein.    Add MVI with minerals daily.  Encourage PO and supplement intake.  Obtain updated weight.  NUTRITION DIAGNOSIS:  Increased nutrient needs related to cancer and cancer related treatments as evidenced by estimated needs.  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  Determining etiology of hypernatremia.  Spoke with pt at bedside. Caregiver not present at time of RD visit.  Pt reports a good appetite and liking the food she has been receiving. She reports also eating well before coming into the hospital.  Per Epic, pt ate 50% of lunch on 1/28. Also ate 100% of breakfast and lunch yesterday, 1/29. Today, she ate 90% of breakfast.  She is unsure of any weight changes. She reports she is weighed at her PCP's office when she visits him.  Per Epic, pt's weight has started to downtrend since 01/08/21. However, weight appears stated this admission. RD to order measured weight to determine validity of change.  Of note, pt with mild BLE edema.  Medications: reviewed; SSI, LR @ 75 ml/hr, oxycodone PO PRN (given twice today)   Labs: reviewed; Na 131 (L), Mag 1.5 (L), CBG 172-261 (H) HbA1c: 7.3% (02/15/2021)  NUTRITION - FOCUSED PHYSICAL EXAM: Flowsheet Row Most Recent Value  Orbital Region No depletion  Upper Arm  Region No depletion  Thoracic and Lumbar Region No depletion  Buccal Region No depletion  Temple Region No depletion  Clavicle Bone Region No depletion  Clavicle and Acromion Bone Region No depletion  Scapular Bone Region No depletion  Dorsal Hand No depletion  Patellar Region No depletion  Anterior Thigh Region No depletion  Posterior Calf Region No depletion  Edema (RD Assessment) Mild  Hair Reviewed  Eyes Reviewed  Mouth Reviewed  Skin Reviewed  Nails Reviewed   Diet Order:   Diet Order             Diet Heart Room service appropriate? Yes; Fluid consistency: Thin  Diet effective now                  EDUCATION NEEDS:  Education needs have been addressed  Skin:  Skin Assessment: Reviewed RN Assessment  Last BM:  02/16/21  Height:  Ht Readings from Last 1 Encounters:  02/14/21 5\' 1"  (1.549 m)   Weight:  Wt Readings from Last 1 Encounters:  02/14/21 83.9 kg   BMI:  Body mass index is 34.96 kg/m.  Estimated Nutritional Needs:  Kcal:  1900-2100 Protein:  95-110 grams Fluid:  >1.9 L  Derrel Nip, RD, LDN (she/her/hers) Clinical Inpatient Dietitian RD Pager/After-Hours/Weekend Pager # in [place]

## 2021-02-17 NOTE — Consult Note (Signed)
NAME: Tamara Mejia  DOB: [birth year]  MRN: [id number]  Date/Time: 02/17/2021 11:14 AM  REQUESTING PROVIDER:Dr.Griffith Subjective:  REASON FOR CONSULT: MSSA abcteremia ? DRUSILLA Mejia is a 71 y.o. female with a history of ca breast on chemotherapy thru port . 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  No acute or destructive bony lesions.  She was started on Periactin and azithromycin for possible pneumonia..  Blood cultures came back positive for staph aureus.  Which is methicillin sensitive and I am seeing the patient for the same. Patient gives minimal history but does say that the right hip hurts She has been having drenching sweats while in the hospital and her fever has been as high as 102.6.   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 Home test (+).   Hypertension    Meningitis    in childhood   Obesity (BMI 30-39.9)    Overactive bladder    Seizure ([place])    last seizure 2000    Past Surgical History:  Procedure Laterality Date   ABDOMINAL HYSTERECTOMY     as a teenager   BREAST BIOPSY Right    neg   BREAST BIOPSY Left 10/18/2020   u/s breast  biopsy, "venus" clip-INVASIVE MAMMARY CARCINOMA   CHOLECYSTECTOMY     COLONOSCOPY WITH PROPOFOL N/A 08/27/2015   Procedure: COLONOSCOPY WITH PROPOFOL;  Surgeon: Lucilla Lame, MD;   Location: [facility] ENDOSCOPY;  Service: Endoscopy;  Laterality: N/A;   PART MASTECTOMY,RADIO FREQUENCY LOCALIZER,AXILLARY SENTINEL NODE BIOPSY Left 11/22/2020   Procedure: PART MASTECTOMY, RADIO FREQUENCY LOCALIZER,AXILLARY SENTINEL NODE BIOPSY;  Surgeon: Herbert Pun, MD;  Location: [facility] ORS;  Service: General;  Laterality: Left;   PORTACATH PLACEMENT N/A 12/31/2020   Procedure: INSERTION PORT-A-CATH;  Surgeon: Herbert Pun, MD;  Location: [facility] ORS;  Service: General;  Laterality: N/A;    Social History   Socioeconomic History   Marital status: Single    Spouse name: Not on file   Number of children: Not on file   Years of education: Not on file   Highest education level: Not on file  Occupational History   Not on file  Tobacco Use   Smoking status: Never   Smokeless tobacco: Never  Substance and Sexual Activity   Alcohol use: No   Drug use: No   Sexual activity: Not on file  Other Topics Concern   Not on file  Social History Narrative   Patient works for cutting [employer]   Patient has her adult education certificate   Patient drinks about 2 cups of coffee daily.          Social Determinants of Health   Financial Resource Strain: Not on file  Food Insecurity: Not on file  Transportation Needs: Not on file  Physical Activity: Not  on file  Stress: Not on file  Social Connections: Not on file  Intimate Partner Violence: Not on file    Family History  Problem Relation Age of Onset   Diabetes Father    Heart disease Father    Heart disease Brother    Breast cancer Cousin        dx 76s   No Known Allergies I? Current Facility-Administered Medications  Medication Dose Route Frequency Provider Last Rate Last Admin   acetaminophen (TYLENOL) tablet 650 mg  650 mg Oral Q6H PRN Cox, Amy N, DO   650 mg at 02/17/21 6213   Or   acetaminophen (TYLENOL) suppository 650 mg  650 mg Rectal Q6H PRN Cox, Amy N, DO       amLODipine (NORVASC) tablet 5 mg  5 mg Oral Q  breakfast Cox, Amy N, DO   5 mg at 02/17/21 0865   aspirin EC tablet 81 mg  81 mg Oral Q breakfast Cox, Amy N, DO   81 mg at 02/17/21 0831   carbamazepine (TEGRETOL) tablet 200 mg  200 mg Oral TID Cox, Amy N, DO   200 mg at 02/17/21 0831   ceFAZolin (ANCEF) IVPB 2g/100 mL premix  2 g Intravenous Q8H Nicole Kindred A, DO 200 mL/hr at 02/17/21 0522 2 g at 02/17/21 0522   Chlorhexidine Gluconate Cloth 2 % PADS 6 each  6 each Topical Daily Nicole Kindred A, DO   6 each at 02/17/21 0833   enoxaparin (LOVENOX) injection 40 mg  40 mg Subcutaneous Q24H Cox, Amy N, DO   40 mg at 02/16/21 2127   insulin aspart (novoLOG) injection 0-20 Units  0-20 Units Subcutaneous TID WC Cox, Amy N, DO   4 Units at 02/17/21 7846   insulin aspart (novoLOG) injection 0-5 Units  0-5 Units Subcutaneous QHS Cox, Amy N, DO   2 Units at 02/16/21 2123   lactated ringers infusion   Intravenous Continuous Ezekiel Slocumb, DO 75 mL/hr at 02/16/21 2107 New Bag at 02/16/21 2107   lisinopril (ZESTRIL) tablet 20 mg  20 mg Oral Q breakfast Cox, Amy N, DO   20 mg at 02/17/21 0831   LORazepam (ATIVAN) injection 2 mg  2 mg Intravenous Q1H PRN Cox, Amy N, DO       metoprolol tartrate (LOPRESSOR) injection 2.5 mg  2.5 mg Intravenous Q1H PRN Cox, Amy N, DO   2.5 mg at 02/16/21 0009   ondansetron (ZOFRAN) tablet 4 mg  4 mg Oral Q6H PRN Cox, Amy N, DO       Or   ondansetron (ZOFRAN) injection 4 mg  4 mg Intravenous Q6H PRN Cox, Amy N, DO       oxyCODONE (Oxy IR/ROXICODONE) immediate release tablet 5 mg  5 mg Oral Q6H PRN Sharion Settler, NP   5 mg at 02/17/21 1109   simvastatin (ZOCOR) tablet 20 mg  20 mg Oral QPM Cox, Amy N, DO   20 mg at 02/16/21 1740     Abtx:  Anti-infectives (From admission, onward)    Start     Dose/Rate Route Frequency Ordered Stop   02/16/21 1000  azithromycin (ZITHROMAX) tablet 500 mg  Status:  Discontinued        500 mg Oral Daily 02/15/21 0917 02/16/21 0830   02/15/21 1430  ceFAZolin (ANCEF) IVPB 2g/100 mL  premix        2 g 200 mL/hr over 30 Minutes Intravenous Every 8 hours 02/15/21 1336  02/15/21 0015  azithromycin (ZITHROMAX) 500 mg in sodium chloride 0.9 % 250 mL IVPB  Status:  Discontinued        500 mg 250 mL/hr over 60 Minutes Intravenous Every 24 hours 02/14/21 2320 02/15/21 0917   02/15/21 0015  cefTRIAXone (ROCEPHIN) 2 g in sodium chloride 0.9 % 100 mL IVPB  Status:  Discontinued        2 g 200 mL/hr over 30 Minutes Intravenous Every 24 hours 02/14/21 2320 02/15/21 1336       REVIEW OF SYSTEMS:  Const:  fever,  chills, sweating Eyes: negative diplopia or visual changes, negative eye pain ENT: negative coryza, negative sore throat Resp: no  cough, hemoptysis, dyspnea Cards: negative for chest pain, palpitations, lower extremity edema GU: negative for frequency, dysuria and hematuria GI: Negative for abdominal pain, diarrhea, bleeding, constipation Skin: negative for rash and pruritus Heme: negative for easy bruising and gum/nose bleeding MS: Pain right hip and inability to walk Neurolo:negative for headaches, dizziness, vertigo, memory problems  Psych: negative for feelings of anxiety, depression  Endocrine:  diabetes Allergy/Immunology- negative for any medication or food allergies ? Objective:  VITALS:  BP (!) 158/78 (BP Location: Right Arm)    Pulse (!) 104    Temp 98.9 F (37.2 C) (Axillary)    Resp 18    Ht 5\' 1"  (1.549 m)    Wt 83.9 kg    SpO2 90%    BMI 34.96 kg/m  PHYSICAL EXAM:  General: Alert, cooperative, no distress, appears stated age.  Oriented in place person time Head: Normocephalic, without obvious abnormality, atraumatic. Eyes: Conjunctivae clear, anicteric sclerae. Pupils are equal ENT Nares normal. No drainage or sinus tenderness. Lips, mucosa, and tongue normal. No Thrush Neck: Supple, symmetrical, no adenopathy, thyroid: non tender no carotid bruit and no JVD. Back: No CVA tenderness. Lungs: Bilateral air entry Heart: Tachycardia Port on  the right side of the chest wall.  No erythema or tenderness. Abdomen: Soft, obese.  No tenderness Extremities: Edema legs right more than left Skin: No rashes or lesions. Or bruising Lymph: Cervical, supraclavicular normal. Neurologic: Grossly non-focal Pertinent Labs Lab Results CBC    Component Value Date/Time   WBC 9.4 02/17/2021 0524   RBC 2.80 (L) 02/17/2021 0524   HGB 8.5 (L) 02/17/2021 0524   HGB 12.8 01/02/2021 1013   HCT 26.1 (L) 02/17/2021 0524   HCT 38.6 01/02/2021 1013   PLT 395 02/17/2021 0524   PLT 221 01/02/2021 1013   MCV 93.2 02/17/2021 0524   MCV 92 01/02/2021 1013   MCV 91 12/22/2011 2245   MCH 30.4 02/17/2021 0524   MCHC 32.6 02/17/2021 0524   RDW 14.1 02/17/2021 0524   RDW 12.5 01/02/2021 1013   RDW 12.8 12/22/2011 2245   LYMPHSABS 0.9 02/17/2021 0524   LYMPHSABS 1.4 01/02/2021 1013   MONOABS 0.7 02/17/2021 0524   EOSABS 0.1 02/17/2021 0524   EOSABS 0.3 01/02/2021 1013   BASOSABS 0.1 02/17/2021 0524   BASOSABS 0.1 01/02/2021 1013    CMP Latest Ref Rng & Units 02/17/2021 02/16/2021 02/15/2021  Glucose 70 - 99 mg/dL 165(H) 175(H) 201(H)  BUN 8 - 23 mg/dL 10 12 12   Creatinine 0.44 - 1.00 mg/dL 0.64 0.59 0.69  Sodium 135 - 145 mmol/L 131(L) 127(L) 127(L)  Potassium 3.5 - 5.1 mmol/L 4.3 4.1 3.7  Chloride 98 - 111 mmol/L 95(L) 91(L) 92(L)  CO2 22 - 32 mmol/L 29 28 28   Calcium 8.9 - 10.3 mg/dL 8.3(L) 8.2(L) 7.6(L)  Total Protein 6.5 - 8.1 g/dL 6.2(L) 6.0(L) -  Total Bilirubin 0.3 - 1.2 mg/dL 0.4 0.6 -  Alkaline Phos 38 - 126 U/L 76 61 -  AST 15 - 41 U/L 58(H) 75(H) -  ALT 0 - 44 U/L 58(H) 60(H) -      Microbiology: Recent Results (from the past 240 hour(s))  Resp Panel by RT-PCR (Flu A&B, Covid) Nasopharyngeal Swab     Status: None   Collection Time: 02/14/21  9:16 PM   Specimen: Nasopharyngeal Swab; Nasopharyngeal(NP) swabs in vial transport medium  Result Value Ref Range Status   SARS Coronavirus 2 by RT PCR NEGATIVE NEGATIVE Final     Comment: (NOTE) SARS-CoV-2 target nucleic acids are NOT DETECTED.  The SARS-CoV-2 RNA is generally detectable in upper respiratory specimens during the acute phase of infection. The lowest concentration of SARS-CoV-2 viral copies this assay can detect is 138 copies/mL. A negative result does not preclude SARS-Cov-2 infection and should not be used as the sole basis for treatment or other patient management decisions. A negative result may occur with  improper specimen collection/handling, submission of specimen other than nasopharyngeal swab, presence of viral mutation(s) within the areas targeted by this assay, and inadequate number of viral copies(<138 copies/mL). A negative result must be combined with clinical observations, patient history, and epidemiological information. The expected result is Negative.  Fact Sheet for Patients:  EntrepreneurPulse.com.au  Fact Sheet for Healthcare Providers:  IncredibleEmployment.be  This test is no t yet approved or cleared by the [place] FDA and  has been authorized for detection and/or diagnosis of SARS-CoV-2 by FDA under an Emergency Use Authorization (EUA). This EUA will remain  in effect (meaning this test can be used) for the duration of the COVID-19 declaration under Section 564(b)(1) of the Act, 21 U.S.C.section 360bbb-3(b)(1), unless the authorization is terminated  or revoked sooner.       Influenza A by PCR NEGATIVE NEGATIVE Final   Influenza B by PCR NEGATIVE NEGATIVE Final    Comment: (NOTE) The Xpert Xpress SARS-CoV-2/FLU/RSV plus assay is intended as an aid in the diagnosis of influenza from Nasopharyngeal swab specimens and should not be used as a sole basis for treatment. Nasal washings and aspirates are unacceptable for Xpert Xpress SARS-CoV-2/FLU/RSV testing.  Fact Sheet for Patients: EntrepreneurPulse.com.au  Fact Sheet for Healthcare  Providers: IncredibleEmployment.be  This test is not yet approved or cleared by the [place] FDA and has been authorized for detection and/or diagnosis of SARS-CoV-2 by FDA under an Emergency Use Authorization (EUA). This EUA will remain in effect (meaning this test can be used) for the duration of the COVID-19 declaration under Section 564(b)(1) of the Act, 21 U.S.C. section 360bbb-3(b)(1), unless the authorization is terminated or revoked.  Performed at [facility], [address] [place], [place] [address]   CULTURE, BLOOD (ROUTINE X 2) w Reflex to ID Panel     Status: Abnormal (Preliminary result)   Collection Time: 02/14/21  9:20 PM   Specimen: BLOOD  Result Value Ref Range Status   Specimen Description   Final    BLOOD RIGHT ANTECUBITAL Performed at [facility], [address], [place], [place] [address]    Special Requests   Final    BOTTLES DRAWN AEROBIC AND ANAEROBIC Blood Culture adequate volume Performed at [facility], [address], [place], [place] [address]    Culture  Setup Time   Final    Organism ID to follow [place]  IN BOTH AEROBIC AND ANAEROBIC BOTTLES CRITICAL RESULT CALLED TO, READ BACK BY AND VERIFIED WITH: [place] ON 02/15/21 AT 5427 QSD Performed at [facility], [address] [place], [place] [address]    Culture (A)  Final    STAPHYLOCOCCUS AUREUS SUSCEPTIBILITIES TO FOLLOW REPEATING Performed at [facility], [place] [address], [place], [place] [address]    Report Status PENDING  Incomplete  CULTURE, BLOOD (ROUTINE X 2) w Reflex to ID Panel     Status: Abnormal (Preliminary result)   Collection Time: 02/14/21  9:20 PM   Specimen: BLOOD  Result Value Ref Range Status   Specimen Description   Final    BLOOD BLOOD RIGHT HAND Performed at [facility], [address] [place], [place] [address]    Special Requests   Final    BOTTLES DRAWN AEROBIC  AND ANAEROBIC Blood Culture results may not be optimal due to an inadequate volume of blood received in culture bottles Performed at [facility], [address] [place], [place] [address]    Culture  Setup Time   Final    GRAM POSITIVE COCCI IN BOTH AEROBIC AND ANAEROBIC BOTTLES CRITICAL RESULT CALLED TO, READ BACK BY AND VERIFIED WITH: [place] ON 02/15/21 AT 0959 Performed at [facility], [address] [place], [place] [address]    Culture STAPHYLOCOCCUS AUREUS (A)  Final   Report Status PENDING  Incomplete  Blood Culture ID Panel (Reflexed)     Status: Abnormal   Collection Time: 02/14/21  9:20 PM  Result Value Ref Range Status   Enterococcus faecalis NOT DETECTED NOT DETECTED Final   Enterococcus Faecium NOT DETECTED NOT DETECTED Final   Listeria monocytogenes NOT DETECTED NOT DETECTED Final   Staphylococcus species DETECTED (A) NOT DETECTED Final    Comment: CRITICAL RESULT CALLED TO, READ BACK BY AND VERIFIED WITH: SUSAN WATSON ON 02/15/21 AT 0959 QSD    Staphylococcus aureus (BCID) DETECTED (A) NOT DETECTED Final    Comment: CRITICAL RESULT CALLED TO, READ BACK BY AND VERIFIED WITH: SUSAN WATSON ON 02/15/21 AT 0959 QSD    Staphylococcus epidermidis NOT DETECTED NOT DETECTED Final   Staphylococcus lugdunensis NOT DETECTED NOT DETECTED Final   Streptococcus species NOT DETECTED NOT DETECTED Final   Streptococcus agalactiae NOT DETECTED NOT DETECTED Final   Streptococcus pneumoniae NOT DETECTED NOT DETECTED Final   Streptococcus pyogenes NOT DETECTED NOT DETECTED Final   A.calcoaceticus-baumannii NOT DETECTED NOT DETECTED Final   Bacteroides fragilis NOT DETECTED NOT DETECTED Final   Enterobacterales NOT DETECTED NOT DETECTED Final   Enterobacter cloacae complex NOT DETECTED NOT DETECTED Final   Escherichia coli NOT DETECTED NOT DETECTED Final   Klebsiella aerogenes NOT DETECTED NOT DETECTED Final   Klebsiella oxytoca NOT DETECTED NOT DETECTED Final    Klebsiella pneumoniae NOT DETECTED NOT DETECTED Final   Proteus species NOT DETECTED NOT DETECTED Final   Salmonella species NOT DETECTED NOT DETECTED Final   Serratia marcescens NOT DETECTED NOT DETECTED Final   Haemophilus influenzae NOT DETECTED NOT DETECTED Final   Neisseria meningitidis NOT DETECTED NOT DETECTED Final   Pseudomonas aeruginosa NOT DETECTED NOT DETECTED Final   Stenotrophomonas maltophilia NOT DETECTED NOT DETECTED Final   Candida albicans NOT DETECTED NOT DETECTED Final   Candida auris NOT DETECTED NOT DETECTED Final   Candida glabrata NOT DETECTED NOT DETECTED Final   Candida krusei NOT DETECTED NOT DETECTED Final   Candida parapsilosis NOT DETECTED NOT DETECTED Final  Candida tropicalis NOT DETECTED NOT DETECTED Final   Cryptococcus neoformans/gattii NOT DETECTED NOT DETECTED Final   Meth resistant mecA/C and MREJ NOT DETECTED NOT DETECTED Final    Comment: Performed at [facility], [address] [place], [place] [address]    IMAGING RESULTS:  I have personally reviewed the films ?  No acute cardiopulmonary findings  Impression/Recommendation 71 year old female presenting with fever, sweats, pain right hip Has CA breast and is on chemotherapy Has a port Staph aureus bacteremia Patient has fevers with drenching sweats.  The port is being accessed.  The port very likely is infected.  We will recommend not using the port and removal of the port Repeat blood cultures 2D echo suboptimal Needs TEE to rule out endocarditis Patient currently on cefazolin.  Continue. Recommend ortho consult for rt hip pain and MRI findings and to r/o any infection at the ischial tuberosity ? ___________________________________________________ Discussed with patient, requesting provider Note:  This document was prepared using Dragon voice recognition software and may include unintentional dictation errors.

## 2021-02-17 NOTE — Progress Notes (Signed)
*  PRELIMINARY RESULTS* Echocardiogram 2D Echocardiogram has been performed.  Tamara Mejia 02/17/2021, 9:40 AM

## 2021-02-17 NOTE — Telephone Encounter (Signed)
Per  Nicole Kindred: "this patient currently on chemotherapy for breast cancer is admitted with sepsis due MSSA bacteremia.  Just wanted to make you aware.  I'm assuming ID will want her port removed if we find no other source of infection."  Per Dr. Tasia Catchings: will cancel her chemo. will not be able to offer her any chemo until infection resolves and finishes antibiotic course.

## 2021-02-17 NOTE — Assessment & Plan Note (Deleted)
Pt denies dysuria, frequency or other GU symptoms.  On antibiotics for MSSA bacteremia.

## 2021-02-17 NOTE — Consult Note (Signed)
ORTHOPAEDIC CONSULTATION  REQUESTING PHYSICIAN: Ezekiel Slocumb, DO  Chief Complaint:   Right hip pain  History of Present Illness: Tamara Mejia is a 71 y.o. female with multiple medical problems including mild cognitive dysfunction, diabetes, history of breast cancer, hypertension, hypercholesterolemia, and obesity who lives in a group home presented to the emergency room 3 days ago complaining of right hip pain with weightbearing.  The patient did not have a history of any falls, but appeared to be weaker than usual.  The patient was noted to have an elevated white count and a low sodium, and so was admitted for medical optimization and further work-up.  The patient localizes the pain to the posterior aspect of the right hip and notes that it is worse with weightbearing.  However, she does feel that it is improving today already as compared to the first several days of hospitalization.  In fact, she notes that she was able to get to the bathroom and back.  She denies any specific injury to the right hip.  She did not fall on it, nor does she recall any slips or other nonfocal injuries.  She also denies any numbness or paresthesias down her leg to her foot.  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 Home test (+).   Hypertension    Meningitis    in childhood   Obesity (BMI 30-39.9)    Overactive bladder    Seizure ([place])    last seizure 2000   Past Surgical History:  Procedure Laterality Date   ABDOMINAL HYSTERECTOMY     as a teenager   BREAST BIOPSY Right    neg   BREAST BIOPSY Left 10/18/2020    u/s breast  biopsy, "venus" clip-INVASIVE MAMMARY CARCINOMA   CHOLECYSTECTOMY     COLONOSCOPY WITH PROPOFOL N/A 08/27/2015   Procedure: COLONOSCOPY WITH PROPOFOL;  Surgeon: Lucilla Lame, MD;  Location: [facility] ENDOSCOPY;  Service: Endoscopy;  Laterality: N/A;   PART MASTECTOMY,RADIO FREQUENCY LOCALIZER,AXILLARY SENTINEL NODE BIOPSY Left 11/22/2020   Procedure: PART MASTECTOMY, RADIO FREQUENCY LOCALIZER,AXILLARY SENTINEL NODE BIOPSY;  Surgeon: Herbert Pun, MD;  Location: [facility] ORS;  Service: General;  Laterality: Left;   PORTACATH PLACEMENT N/A 12/31/2020   Procedure: INSERTION PORT-A-CATH;  Surgeon: Herbert Pun, MD;  Location: [facility] ORS;  Service: General;  Laterality: N/A;   Social History   Socioeconomic History   Marital status: Single    Spouse name: Not on file   Number of children: Not on file   Years of education: Not on file   Highest education level: Not on file  Occupational History   Not on file  Tobacco Use   Smoking status: Never   Smokeless tobacco: Never  Substance and Sexual Activity   Alcohol use: No   Drug use: No   Sexual activity: Not on file  Other Topics Concern   Not on file  Social History Narrative   Patient works for cutting [employer]   Patient has her adult education certificate   Patient drinks about 2 cups of coffee daily.          Social Determinants of Health  Financial Resource Strain: Not on file  Food Insecurity: Not on file  Transportation Needs: Not on file  Physical Activity: Not on file  Stress: Not on file  Social Connections: Not on file   Family History  Problem Relation Age of Onset   Diabetes Father    Heart disease Father    Heart disease Brother    Breast cancer Cousin        dx 35s   No Known Allergies Prior to Admission medications   Medication Sig Start Date End Date Taking? Authorizing Provider  acetaminophen (TYLENOL) 500 MG tablet Take 1,000 mg by mouth every 8 (eight) hours as needed (pain.).   Yes  [provider]  amLODipine (NORVASC) 5 MG tablet Take 5 mg by mouth in the morning.   Yes [provider]  aspirin 81 MG EC tablet Take 81 mg by mouth in the morning. Swallow whole.   Yes [provider]  carbamazepine (TEGRETOL) 200 MG tablet TAKE ONE TABLET BY MOUTH THREE TIMES DAILY. (SEIZURE CONTROL) 10/21/17  Yes Dennie Bible, NP  Cholecalciferol (VITAMIN D) 50 MCG (2000 UT) tablet Take 4,000 Units by mouth in the morning.   Yes [provider]  dexamethasone (DECADRON) 4 MG tablet Take 2 tablets (8 mg total) by mouth 2 (two) times daily. Start the day before Taxotere. Then again the day after chemo for 3 days. 12/23/20  Yes Earlie Server, MD  HYDROcodone-acetaminophen [facility]) 10-325 MG tablet Take 1 tablet by mouth every 6 (six) hours as needed.   Yes [provider]  ibuprofen (ADVIL) 200 MG tablet Take 400 mg by mouth every 8 (eight) hours as needed (pain.).   Yes [provider]  lidocaine-prilocaine (EMLA) cream Apply to affected area once 12/23/20  Yes Earlie Server, MD  lisinopril-hydrochlorothiazide (ZESTORETIC) 20-12.5 MG tablet Take 1 tablet by mouth every morning. 07/08/15  Yes [provider]  loratadine (CLARITIN) 10 MG tablet Take 1 tablet (10 mg total) by mouth daily. Take Claritin daily for 4 days, Day1-4 of each chemo cycle. 01/08/21  Yes Earlie Server, MD  metFORMIN (GLUCOPHAGE) 500 MG tablet Take by mouth 2 (two) times daily with a meal. 01/10/21 01/10/22 Yes [provider]  ondansetron (ZOFRAN) 8 MG tablet Take 1 tablet (8 mg total) by mouth 2 (two) times daily as needed for refractory nausea / vomiting. Start on day 3 after chemo. 12/23/20  Yes Earlie Server, MD  prochlorperazine (COMPAZINE) 10 MG tablet Take 1 tablet (10 mg total) by mouth every 6 (six) hours as needed (Nausea or vomiting). 12/23/20  Yes Earlie Server, MD  simvastatin (ZOCOR) 20 MG tablet Take 20 mg by mouth in the morning.   Yes [provider]    ECHOCARDIOGRAM COMPLETE  Result Date: 02/17/2021    ECHOCARDIOGRAM REPORT   Patient Name:   Tamara Mejia Date of Exam: 02/17/2021 Medical Rec #:  [id number]      Height:       61.0 in Accession #:    [id number]     Weight:       185.0 lb Date of Birth:  10-12-1950       BSA:          1.827 m Patient Age:    32 years       BP:           158/87 mmHg Patient Gender: F              HR:  113 bpm. Exam Location:  [facility] Procedure: 2D Echo, Color Doppler and Cardiac Doppler Indications:     R78.81 Bacteremia  History:         Patient has prior history of Echocardiogram examinations, most                  recent 01/01/2021. Risk Factors:Hypertension, Diabetes and HCL.  Sonographer:     Charmayne Sheer Referring Phys:  [id number] Claiborne Billings A GRIFFITH Diagnosing Phys: Kathlyn Sacramento MD  Sonographer Comments: Suboptimal apical window and no subcostal window. Image acquisition challenging due to patient body habitus. IMPRESSIONS  1. Left ventricular ejection fraction, by estimation, is 60 to 65%. The left ventricle has normal function. The left ventricle has no regional wall motion abnormalities. There is mild left ventricular hypertrophy. Left ventricular diastolic parameters are consistent with Grade I diastolic dysfunction (impaired relaxation).  2. Right ventricular systolic function is normal. The right ventricular size is normal. Tricuspid regurgitation signal is inadequate for assessing PA pressure.  3. The mitral valve is normal in structure. No evidence of mitral valve regurgitation. No evidence of mitral stenosis.  4. The aortic valve is normal in structure. Aortic valve regurgitation is not visualized. No aortic stenosis is present.  5. No clear vegetations but overall suboptimal study. FINDINGS  Left Ventricle: Left ventricular ejection fraction, by estimation, is 60 to 65%. The left ventricle has normal function. The left ventricle has no regional wall motion abnormalities. The left ventricular internal cavity size  was normal in size. There is  mild left ventricular hypertrophy. Left ventricular diastolic parameters are consistent with Grade I diastolic dysfunction (impaired relaxation). Right Ventricle: The right ventricular size is normal. No increase in right ventricular wall thickness. Right ventricular systolic function is normal. Tricuspid regurgitation signal is inadequate for assessing PA pressure. Left Atrium: Left atrial size was normal in size. Right Atrium: Right atrial size was normal in size. Pericardium: There is no evidence of pericardial effusion. Mitral Valve: The mitral valve is normal in structure. No evidence of mitral valve regurgitation. No evidence of mitral valve stenosis. MV peak gradient, 6.7 mmHg. The mean mitral valve gradient is 4.0 mmHg. Tricuspid Valve: The tricuspid valve is normal in structure. Tricuspid valve regurgitation is not demonstrated. No evidence of tricuspid stenosis. Aortic Valve: The aortic valve is normal in structure. Aortic valve regurgitation is not visualized. No aortic stenosis is present. Aortic valve mean gradient measures 6.0 mmHg. Aortic valve peak gradient measures 11.6 mmHg. Aortic valve area, by VTI measures 1.95 cm. Pulmonic Valve: The pulmonic valve was normal in structure. Pulmonic valve regurgitation is not visualized. No evidence of pulmonic stenosis. Aorta: The aortic root is normal in size and structure. Venous: The inferior vena cava was not well visualized. IAS/Shunts: No atrial level shunt detected by color flow Doppler.  LEFT VENTRICLE PLAX 2D LVIDd:         4.30 cm   Diastology LVIDs:         2.74 cm   LV e' medial:    7.29 cm/s LV PW:         1.09 cm   LV E/e' medial:  11.6 LV IVS:        0.78 cm   LV e' lateral:   9.57 cm/s LVOT diam:     1.80 cm   LV E/e' lateral: 8.9 LV SV:         47 LV SV Index:   26 LVOT Area:     2.54 cm  RIGHT VENTRICLE RV Basal diam:  3.77 cm LEFT ATRIUM           Index LA diam:      2.90 cm 1.59 cm/m LA Vol (A4C): 37.1 ml  20.30 ml/m  AORTIC VALVE                     PULMONIC VALVE AV Area (Vmax):    1.93 cm      PV Vmax:       1.18 m/s AV Area (Vmean):   1.94 cm      PV Vmean:      78.200 cm/s AV Area (VTI):     1.95 cm      PV VTI:        0.188 m AV Vmax:           170.00 cm/s   PV Peak grad:  5.6 mmHg AV Vmean:          112.000 cm/s  PV Mean grad:  3.0 mmHg AV VTI:            0.242 m AV Peak Grad:      11.6 mmHg AV Mean Grad:      6.0 mmHg LVOT Vmax:         129.00 cm/s LVOT Vmean:        85.200 cm/s LVOT VTI:          0.185 m LVOT/AV VTI ratio: 0.76  AORTA Ao Root diam: 3.00 cm MITRAL VALVE MV Area (PHT): 6.32 cm     SHUNTS MV Area VTI:   2.27 cm     Systemic VTI:  0.18 m MV Peak grad:  6.7 mmHg     Systemic Diam: 1.80 cm MV Mean grad:  4.0 mmHg MV Vmax:       1.29 m/s MV Vmean:      88.8 cm/s MV Decel Time: 120 msec MV E velocity: 84.80 cm/s MV A velocity: 114.00 cm/s MV E/A ratio:  0.74 Kathlyn Sacramento MD Electronically signed by Kathlyn Sacramento MD Signature Date/Time: 02/17/2021/1:06:03 PM    Final     Positive ROS: All other systems have been reviewed and were otherwise negative with the exception of those mentioned in the HPI and as above.  Physical Exam: General:  Alert, no acute distress.  She is sitting comfortably in her chair with her legs elevated.   Psychiatric:  Patient appears to be competent with normal mood and affect, and answers all of my questions appropriately. Cardiovascular:  No pedal edema Respiratory:  No wheezing, non-labored breathing GI:  Abdomen is soft and non-tender Skin:  No lesions in the area of chief complaint Neurologic:  Sensation intact distally Lymphatic:  No axillary or cervical lymphadenopathy  Orthopedic Exam:  Orthopedic examination is limited to the right hip and lower extremity.  Skin inspection of the right hip and thigh area is unremarkable.  No swelling, erythema, ecchymosis, abrasions, or other skin abnormalities are identified.  She has no tenderness to palpation  of the anterior lateral aspects of the hip, but has mild tenderness to palpation over the ischial tuberosity region more posteriorly.  Actively, she is able to internally and externally rotate her hip and leg without pain.  Passively, she is able to tolerate logrolling of the right lower extremity without any pain as well.  She is neurovascularly intact to the right lower extremity and foot.  X-rays:  A recent MRI scan of the pelvis and right hip is  available for review and has been reviewed by myself.  By report, the study demonstrates evidence of "minimal fluid signal within the insertion of the right obturator internus muscle, as well as along the medial margin of the right ischial tuberosity.  Findings consistent with muscular strain and associated obturator internus bursitis."  In addition, the articular cartilage demonstrates no gross abnormalities and there is "trace symmetrical bilateral hip effusions".  The bone portion of the study shows no evidence of abnormal signal within the bones and no active or destructive bony lesions.  Assessment: Posterior right hip pain secondary to probable muscle strain with probable obturator internus bursitis.  Plan: The clinical findings and treatment options were discussed with the patient.  At this point, I see no indication to suggest that there may be a source of her infection coming from the right hip area.  Certainly, there is no indication to suggest that she has a septic right hip.  Furthermore, the area of "minimal fluid signal" along the right obturator internus muscle at the ischial tuberosity is not of sufficient magnitude to suggest an infectious process in this area.  Given that the patient already is feeling better and is able to mobilize more comfortably, I would recommend that IV antibiotics be continued for treatment of presumed infection of her port.  The patient may continue to be mobilized with physical therapy as symptoms permit.  Thank  you for asking me to participate in the care of this most pleasant yet unfortunate woman.  I will be happy to follow her with you.   Pascal Lux, MD  Beeper #:  [phone]  02/17/2021 4:05 PM

## 2021-02-17 NOTE — Progress Notes (Addendum)
Physical Therapy Treatment Patient Details Name: Tamara Mejia MRN: [id number] DOB: 04-19-1950 Today's Date: 02/17/2021   History of Present Illness Pt is a 71 y/o F admitted on 02/14/21 with c/c of R hip pain. Pt is admitted for work-up. MRI findings consistent with muscular strain and  associated obturator internus bursitis. Blood cultures positive for MSSA. 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 Patient is agreeable to PT session. Reports no pain, had received pain medicine prior to session. She requires min assist for supine to sit and found to be soaking wet in bed. Patient is able to stand with min guard. Cues for posture, hand placement and to keep walker close to her. She performed step pivot to recliner, then to [facility] and back to recliner. Requires assistance cleaning up after using commode. She will continue to benefit from skilled PT while here to improve functional independence and safety with mobility. Patient did not have any reports of pain during session. Patient would be a good candidate for inpatient rehab if qualified. She is pleasant and motivated to improve.         Recommendations for follow up therapy are one component of a multi-disciplinary discharge planning process, led by the attending physician.  Recommendations may be updated based on patient status, additional functional criteria and insurance authorization.  Follow Up Recommendations  Acute inpatient rehab (3hours/day)   Assistance Recommended at Discharge Intermittent Supervision/Assistance  Patient can return home with the following A little help with walking and/or transfers;A little help with bathing/dressing/bathroom;Help with stairs or ramp for entrance;Direct supervision/assist for medications management;Assistance with  cooking/housework;Direct supervision/assist for financial management;Assist for transportation   Equipment Recommendations  Rolling walker (2 wheels);BSC/3in1    Recommendations for Other Services Rehab consult     Precautions / Restrictions Precautions Precautions: Fall Restrictions Weight Bearing Restrictions: Yes RLE Weight Bearing: Weight bearing as tolerated     Mobility  Bed Mobility Overal bed mobility: Needs Assistance Bed Mobility: Supine to Sit     Supine to sit: Min assist, HOB elevated          Transfers Overall transfer level: Needs assistance Equipment used: Rolling walker (2 wheels) Transfers: Sit to/from Stand Sit to Stand: Min guard   Step pivot transfers: Min assist            Ambulation/Gait Ambulation/Gait assistance: Herbalist (Feet): 6 Feet Assistive device: Rolling walker (2 wheels) Gait Pattern/deviations: Step-to pattern, Decreased step length - right, Decreased step length - left, Trunk flexed Gait velocity: significanlty decreased     General Gait Details: patient requires assistance and cues for posture and safe use of AD. Tends to push walker too far in front.   Stairs             Wheelchair Mobility    Modified Rankin (Stroke Patients Only)       Balance Overall balance assessment: Modified Independent Sitting-balance support: Feet supported Sitting balance-Leahy Scale: Good Sitting balance - Comments: supervision static sitting   Standing balance support: Bilateral upper extremity supported, During functional activity Standing balance-Leahy Scale: Fair Standing balance comment: no pain this session. But received pain medicine prior to session.                            Cognition Arousal/Alertness: Awake/alert Behavior  During Therapy: WFL for tasks assessed/performed Overall Cognitive Status: History of cognitive impairments - at baseline                                  General Comments: increased time for simple commands        Exercises      General Comments        Pertinent Vitals/Pain Pain Assessment Pain Assessment: No/denies pain Faces Pain Scale: No hurt Breathing: normal Negative Vocalization: none Facial Expression: smiling or inexpressive Body Language: relaxed Consolability: no need to console PAINAD Score: 0 Pain Intervention(s): Monitored during session    Home Living                          Prior Function            PT Goals (current goals can now be found in the care plan section) Acute Rehab PT Goals Patient Stated Goal: none stated PT Goal Formulation: With patient Time For Goal Achievement: 03/01/21 Progress towards PT goals: Progressing toward goals    Frequency    Min 2X/week      PT Plan Discharge plan needs to be updated;Frequency needs to be updated    Co-evaluation              AM-PAC PT "6 Clicks" Mobility   Outcome Measure  Help needed turning from your back to your side while in a flat bed without using bedrails?: A Little Help needed moving from lying on your back to sitting on the side of a flat bed without using bedrails?: A Little Help needed moving to and from a bed to a chair (including a wheelchair)?: A Little Help needed standing up from a chair using your arms (e.g., wheelchair or bedside chair)?: A Little Help needed to walk in hospital room?: A Little Help needed climbing 3-5 steps with a railing? : A Lot 6 Click Score: 17    End of Session Equipment Utilized During Treatment: Gait belt Activity Tolerance: Patient tolerated treatment well Patient left: in chair;with call bell/phone within reach;with chair alarm set Nurse Communication: Mobility status PT Visit Diagnosis: Muscle weakness (generalized) (M62.81);Difficulty in walking, not elsewhere classified (R26.2);Unsteadiness on feet (R26.81)     Time: 3570-1779 PT Time Calculation (min) (ACUTE  ONLY): 28 min  Charges:  $Gait Training: 8-22 mins $Therapeutic Activity: 8-22 mins                     Kateena Degroote, PT, GCS 02/17/21,3:21 PM

## 2021-02-17 NOTE — TOC Progression Note (Signed)
Transition of Care [facility]) - Progression Note    Patient Details  Name: Tamara Mejia MRN: [id number] Date of Birth: 22-Oct-1950  [facility] of [facility]) CM/SW [place], RN Phone Number: 02/17/2021, 9:28 AM  Clinical Narrative:   The legal guardian Geraldo Pitter called me back and stated that she wants a physician to call her with what is going on Medically with the patient, I reached out to the Hospitalist and requested them to call the legal guardian, I explained that PT has seen and evaluated the patient and is recommending STR SNF, I also explained if the patient is getting Chemo that the STR will not do the Chemo, she stated that the next treatment is supposed to be Wednesday.  She stated she will wait to speak to the Doctor before talking about a DC plan         Expected Discharge Plan and Services                                                 Social Determinants of Health (SDOH) Interventions    Readmission Risk Interventions No flowsheet data found.

## 2021-02-17 NOTE — Progress Notes (Signed)
Inpatient Rehab Admissions Coordinator:   Patient was screened for CIR candidacy by Clemens Catholic, MS, CCC-SLP. Case reviewed with rehab MD. At this time, Pt. Appears to be a a potential candidate for CIR. I will place   order for rehab consult per protocol for full assessment. Please contact me any with questions.  Clemens Catholic, [place], [place] Admissions Coordinator  [phone] ([place]) [phone] (office)

## 2021-02-18 ENCOUNTER — Encounter: Payer: Self-pay | Admitting: Vascular Surgery

## 2021-02-18 ENCOUNTER — Encounter
Admission: EM | Disposition: A | Discharge status: Skilled Nursing Facility | Payer: Self-pay | Source: Home / Self Care | Attending: Internal Medicine

## 2021-02-18 DIAGNOSIS — E8809 Other disorders of plasma-protein metabolism, not elsewhere classified: Secondary | ICD-10-CM

## 2021-02-18 DIAGNOSIS — D649 Anemia, unspecified: Secondary | ICD-10-CM | POA: Diagnosis not present

## 2021-02-18 DIAGNOSIS — E649 Sequelae of unspecified nutritional deficiency: Secondary | ICD-10-CM | POA: Diagnosis not present

## 2021-02-18 DIAGNOSIS — M25551 Pain in right hip: Secondary | ICD-10-CM | POA: Diagnosis not present

## 2021-02-18 DIAGNOSIS — C50912 Malignant neoplasm of unspecified site of left female breast: Secondary | ICD-10-CM

## 2021-02-18 DIAGNOSIS — C50919 Malignant neoplasm of unspecified site of unspecified female breast: Secondary | ICD-10-CM

## 2021-02-18 DIAGNOSIS — E871 Hypo-osmolality and hyponatremia: Secondary | ICD-10-CM

## 2021-02-18 DIAGNOSIS — R7881 Bacteremia: Secondary | ICD-10-CM | POA: Diagnosis not present

## 2021-02-18 DIAGNOSIS — A419 Sepsis, unspecified organism: Secondary | ICD-10-CM

## 2021-02-18 DIAGNOSIS — B9561 Methicillin susceptible Staphylococcus aureus infection as the cause of diseases classified elsewhere: Secondary | ICD-10-CM | POA: Diagnosis not present

## 2021-02-18 DIAGNOSIS — D638 Anemia in other chronic diseases classified elsewhere: Secondary | ICD-10-CM

## 2021-02-18 HISTORY — PX: PORTA CATH REMOVAL: CATH118286

## 2021-02-18 LAB — COMPREHENSIVE METABOLIC PANEL
ALT: 39 U/L (ref 0–44)
AST: 35 U/L (ref 15–41)
Albumin: 1.9 g/dL — ABNORMAL LOW (ref 3.5–5.0)
Alkaline Phosphatase: 67 U/L (ref 38–126)
Anion gap: 8 (ref 5–15)
BUN: 13 mg/dL (ref 8–23)
CO2: 26 mmol/L (ref 22–32)
Calcium: 7.9 mg/dL — ABNORMAL LOW (ref 8.9–10.3)
Chloride: 94 mmol/L — ABNORMAL LOW (ref 98–111)
Creatinine, Ser: 0.63 mg/dL (ref 0.44–1.00)
GFR, Estimated: >60 mL/min (ref >60)
Glucose, Bld: 197 mg/dL — ABNORMAL HIGH (ref 70–99)
Potassium: 4.2 mmol/L (ref 3.5–5.1)
Sodium: 128 mmol/L — ABNORMAL LOW (ref 135–145)
Total Bilirubin: 0.5 mg/dL (ref 0.3–1.2)
Total Protein: 6 g/dL — ABNORMAL LOW (ref 6.5–8.1)

## 2021-02-18 LAB — CBC
HCT: 22.9 % — ABNORMAL LOW (ref 36.0–46.0)
Hemoglobin: 7.6 g/dL — ABNORMAL LOW (ref 12.0–15.0)
MCH: 29.8 pg (ref 26.0–34.0)
MCHC: 33.2 g/dL (ref 30.0–36.0)
MCV: 89.8 fL (ref 80.0–100.0)
Platelets: 375 10*3/uL (ref 150–400)
RBC: 2.55 MIL/uL — ABNORMAL LOW (ref 3.87–5.11)
RDW: 14.3 % (ref 11.5–15.5)
WBC: 9.2 10*3/uL (ref 4.0–10.5)
nRBC: 0 % (ref 0.0–0.2)

## 2021-02-18 LAB — PATHOLOGIST SMEAR REVIEW

## 2021-02-18 LAB — GLUCOSE, CAPILLARY
Glucose-Capillary: 194 mg/dL — ABNORMAL HIGH (ref 70–99)
Glucose-Capillary: 187 mg/dL — ABNORMAL HIGH (ref 70–99)
Glucose-Capillary: 188 mg/dL — ABNORMAL HIGH (ref 70–99)
Glucose-Capillary: 157 mg/dL — ABNORMAL HIGH (ref 70–99)
Glucose-Capillary: 179 mg/dL — ABNORMAL HIGH (ref 70–99)

## 2021-02-18 LAB — RETICULOCYTES
Immature Retic Fract: 26.9 % — ABNORMAL HIGH (ref 2.3–15.9)
RBC.: 2.52 MIL/uL — ABNORMAL LOW (ref 3.87–5.11)
Retic Count, Absolute: 65.3 10*3/uL (ref 19.0–186.0)
Retic Ct Pct: 2.6 % (ref 0.4–3.1)

## 2021-02-18 LAB — IRON AND TIBC
Iron: 14 ug/dL — ABNORMAL LOW (ref 28–170)
Saturation Ratios: 14 % (ref 10.4–31.8)
TIBC: 99 ug/dL — ABNORMAL LOW (ref 250–450)
UIBC: 85 ug/dL

## 2021-02-18 LAB — CULTURE, BLOOD (ROUTINE X 2): Special Requests: ADEQUATE

## 2021-02-18 LAB — FERRITIN: Ferritin: 618 ng/mL — ABNORMAL HIGH (ref 11–307)

## 2021-02-18 LAB — MAGNESIUM: Magnesium: 1.6 mg/dL — ABNORMAL LOW (ref 1.7–2.4)

## 2021-02-18 LAB — FOLATE: Folate: 8.1 ng/mL (ref >5.9)

## 2021-02-18 SURGERY — PORTA CATH REMOVAL
Anesthesia: Moderate Sedation

## 2021-02-18 MED ORDER — MIDAZOLAM HCL 2 MG/2ML IJ SOLN
INTRAMUSCULAR | Status: AC
  Filled 2021-02-18: qty 2

## 2021-02-18 MED ORDER — ACETAMINOPHEN 650 MG RE SUPP: 650.0000 mg | Freq: Four times a day (QID) | RECTAL | Status: DC | PRN

## 2021-02-18 MED ORDER — SODIUM CHLORIDE 0.9 % IV SOLN: INTRAVENOUS | Status: DC

## 2021-02-18 MED ORDER — LISINOPRIL 20 MG PO TABS
40.0000 mg | ORAL_TABLET | Freq: Every day | ORAL | Status: DC
  Administered 2021-02-20 – 2021-02-28 (×9): 40 mg via ORAL
  Filled 2021-02-18 (×9): qty 2

## 2021-02-18 MED ORDER — MAGNESIUM SULFATE 2 GM/50ML IV SOLN
2.0000 g | Freq: Once | INTRAVENOUS | Status: AC
  Administered 2021-02-18: 2 g via INTRAVENOUS
  Filled 2021-02-18: qty 50

## 2021-02-18 MED ORDER — MIDAZOLAM HCL 2 MG/2ML IJ SOLN
INTRAMUSCULAR | Status: DC | PRN
  Administered 2021-02-18: 1 mg via INTRAVENOUS

## 2021-02-18 MED ORDER — LISINOPRIL 20 MG PO TABS
20.0000 mg | ORAL_TABLET | Freq: Once | ORAL | Status: AC
  Administered 2021-02-18: 20 mg via ORAL
  Filled 2021-02-18: qty 1

## 2021-02-18 MED ORDER — FENTANYL CITRATE (PF) 100 MCG/2ML IJ SOLN
INTRAMUSCULAR | Status: AC
  Filled 2021-02-18: qty 2

## 2021-02-18 MED ORDER — ACETAMINOPHEN 325 MG PO TABS
650.0000 mg | ORAL_TABLET | Freq: Four times a day (QID) | ORAL | Status: DC | PRN
  Administered 2021-02-18 – 2021-02-25 (×4): 650 mg via ORAL
  Filled 2021-02-18 (×4): qty 2

## 2021-02-18 MED ORDER — FENTANYL CITRATE (PF) 100 MCG/2ML IJ SOLN
INTRAMUSCULAR | Status: DC | PRN
  Administered 2021-02-18: 25 ug via INTRAVENOUS

## 2021-02-18 SURGICAL SUPPLY — 7 items
COVER SURGICAL LIGHT HANDLE (MISCELLANEOUS) ×1 IMPLANT
DERMABOND ADVANCED (GAUZE/BANDAGES/DRESSINGS) ×1
DERMABOND ADVANCED .7 DNX12 (GAUZE/BANDAGES/DRESSINGS) IMPLANT
PACK ANGIOGRAPHY (CUSTOM PROCEDURE TRAY) ×1 IMPLANT
SUT MNCRL AB 4-0 PS2 18 (SUTURE) ×1 IMPLANT
SUT VIC AB 3-0 SH 27 (SUTURE) ×1
SUT VIC AB 3-0 SH 27X BRD (SUTURE) IMPLANT

## 2021-02-18 NOTE — Progress Notes (Signed)
PHARMACY - PHYSICIAN COMMUNICATION CRITICAL VALUE ALERT - BLOOD CULTURE IDENTIFICATION (BCID)  Tamara Mejia is an 71 y.o. female who presented to [facility] on 02/14/2021 with a chief complaint of right hip pain.   Assessment:  Staph aureus in 1 of 4 bottles, no resistance.  (include suspected source if known)  Name of physician (or Provider) Contacted: Neomia Glass, NP   Current antibiotics: Cefazolin 2 gm IV Q8H  Changes to prescribed antibiotics recommended:  Patient is on recommended antibiotics - No changes needed  Results for orders placed or performed during the hospital encounter of 02/14/21  Blood Culture ID Panel (Reflexed) (Collected: 02/17/2021  1:38 PM)  Result Value Ref Range   Enterococcus faecalis NOT DETECTED NOT DETECTED   Enterococcus Faecium NOT DETECTED NOT DETECTED   Listeria monocytogenes NOT DETECTED NOT DETECTED   Staphylococcus species DETECTED (A) NOT DETECTED   Staphylococcus aureus (BCID) DETECTED (A) NOT DETECTED   Staphylococcus epidermidis NOT DETECTED NOT DETECTED   Staphylococcus lugdunensis NOT DETECTED NOT DETECTED   Streptococcus species NOT DETECTED NOT DETECTED   Streptococcus agalactiae NOT DETECTED NOT DETECTED   Streptococcus pneumoniae NOT DETECTED NOT DETECTED   Streptococcus pyogenes NOT DETECTED NOT DETECTED   A.calcoaceticus-baumannii NOT DETECTED NOT DETECTED   Bacteroides fragilis NOT DETECTED NOT DETECTED   Enterobacterales NOT DETECTED NOT DETECTED   Enterobacter cloacae complex NOT DETECTED NOT DETECTED   Escherichia coli NOT DETECTED NOT DETECTED   Klebsiella aerogenes NOT DETECTED NOT DETECTED   Klebsiella oxytoca NOT DETECTED NOT DETECTED   Klebsiella pneumoniae NOT DETECTED NOT DETECTED   Proteus species NOT DETECTED NOT DETECTED   Salmonella species NOT DETECTED NOT DETECTED   Serratia marcescens NOT DETECTED NOT DETECTED   Haemophilus influenzae NOT DETECTED NOT DETECTED   Neisseria meningitidis NOT DETECTED NOT DETECTED    Pseudomonas aeruginosa NOT DETECTED NOT DETECTED   Stenotrophomonas maltophilia NOT DETECTED NOT DETECTED   Candida albicans NOT DETECTED NOT DETECTED   Candida auris NOT DETECTED NOT DETECTED   Candida glabrata NOT DETECTED NOT DETECTED   Candida krusei NOT DETECTED NOT DETECTED   Candida parapsilosis NOT DETECTED NOT DETECTED   Candida tropicalis NOT DETECTED NOT DETECTED   Cryptococcus neoformans/gattii NOT DETECTED NOT DETECTED   Meth resistant mecA/C and MREJ NOT DETECTED NOT DETECTED    Giordano Getman D 02/18/2021  6:11 AM

## 2021-02-18 NOTE — Progress Notes (Signed)
Occupational Therapy Treatment Patient Details Name: Tamara Mejia MRN: [id number] DOB: 02-Nov-1950 Today's Date: 02/18/2021   History of present illness Pt is a 71 y/o F admitted on 02/14/21 with c/c of R hip pain. Pt is admitted for work-up. MRI findings consistent with muscular strain and  associated obturator internus bursitis. Blood cultures positive for MSSA. 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 Pt greeted in bed, reporting need to use BSC. Tx session targeted progressing independence and tolerance for functional mobility/ADL tasks. Pt appears to be progressing towards goals, noted improvements in tolerance for functional task with less pain reported and limiting movement. Pt is left as received, NAD, all needs met. RN present at completion of tx session. Recommendation upgraded to AIR as pt appears to present with the ability to tolerate 3hrs/therapy a day. OT will continue to follow acutely.    Recommendations for follow up therapy are one component of a multi-disciplinary discharge planning process, led by the attending physician.  Recommendations may be updated based on patient status, additional functional criteria and insurance authorization.    Follow Up Recommendations  Acute inpatient rehab (3hours/day)    Assistance Recommended at Discharge Frequent or constant Supervision/Assistance  Patient can return home with the following  A lot of help with walking and/or transfers;A lot of help with bathing/dressing/bathroom;Direct supervision/assist for medications management;Assist for transportation;Assistance with cooking/housework;Help with stairs or ramp for entrance   Equipment Recommendations  BSC/3in1 (per next venue of care)    Recommendations for Other Services       Precautions / Restrictions Precautions Precautions: Fall Restrictions Weight Bearing Restrictions: Yes RLE Weight Bearing: Weight bearing as tolerated       Mobility Bed Mobility Overal bed mobility: Needs Assistance Bed Mobility: Supine to Sit, Sit to Supine     Supine to sit: Min assist, HOB elevated Sit to supine: Min assist, HOB elevated        Transfers Overall transfer level: Needs assistance Equipment used: Rolling walker (2 wheels) Transfers: Sit to/from Stand, Bed to chair/wheelchair/BSC Sit to Stand: Min assist     Step pivot transfers: Min assist           Balance Overall balance assessment: Needs assistance Sitting-balance support: Feet supported Sitting balance-Leahy Scale: Good     Standing balance support: Bilateral upper extremity supported, During functional activity Standing balance-Leahy Scale: Fair                             ADL either performed or assessed with clinical judgement   ADL Overall ADL's : Needs assistance/impaired     Grooming: Wash/dry face;Sitting;Set up   Upper Body Bathing: Sitting;Set up   Lower Body Bathing: Maximal assistance;Sitting/lateral leans   Upper Body Dressing : Minimal assistance;Sitting       Toilet Transfer: Moderate assistance;+2 for physical assistance;Minimal assistance Toilet Transfer Details (indicate cue type and reason): MOD A +2 HHA 1 attempt, MIN A with RW second attempt Toileting- Clothing Manipulation and Hygiene: Maximal assistance Toileting - Clothing Manipulation Details (indicate cue type and reason): for peri care     Functional mobility during ADLs: Minimal assistance;Moderate assistance      Extremity/Trunk Assessment              Vision  Perception     Praxis      Cognition Arousal/Alertness: Awake/alert Behavior During Therapy: WFL for tasks assessed/performed Overall Cognitive Status: History of cognitive impairments - at baseline                                  General Comments: good 1 step directive following        Exercises      Shoulder Instructions       General Comments vss throughout    Pertinent Vitals/ Pain       Pain Assessment Pain Assessment: No/denies pain Pain Location: R hip Pain Descriptors / Indicators: Discomfort, Grimacing Pain Intervention(s): Limited activity within patient's tolerance, Repositioned, Monitored during session  Home Living                                          Prior Functioning/Environment              Frequency  Min 3X/week        Progress Toward Goals  OT Goals(current goals can now be found in the care plan section)  Progress towards OT goals: Progressing toward goals  Acute Rehab OT Goals Patient Stated Goal: go home OT Goal Formulation: With patient Time For Goal Achievement: 03/04/21 Potential to Achieve Goals: Good  Plan Discharge plan remains appropriate    Co-evaluation                 AM-PAC OT "6 Clicks" Daily Activity     Outcome Measure   Help from another person eating meals?: None Help from another person taking care of personal grooming?: A Little Help from another person toileting, which includes using toliet, bedpan, or urinal?: A Lot Help from another person bathing (including washing, rinsing, drying)?: A Lot Help from another person to put on and taking off regular upper body clothing?: A Little Help from another person to put on and taking off regular lower body clothing?: A Lot 6 Click Score: 16    End of Session Equipment Utilized During Treatment: Rolling walker (2 wheels)  OT Visit Diagnosis: Other abnormalities of gait and mobility (R26.89);Pain   Activity Tolerance Patient tolerated treatment well   Patient Left with call bell/phone within reach;with chair alarm set;with nursing/sitter in room;in bed   Nurse Communication Mobility status (RN in room)        Time:  9432-7614 OT Time Calculation (min): 21 min  Charges: OT General Charges $OT Visit: 1 Visit OT Treatments $Self Care/Home Management : 8-22 mins  Shanon Payor, OTD OTR/L  02/18/21, 4:09 PM

## 2021-02-18 NOTE — Progress Notes (Signed)
Inpatient Rehab Admissions Coordinator:   I spoke with pt.'s legal guardian [name], regarding potential CIR admit. She states that it is Pt.'s family's preference that Pt. Return to her group home as soon as possible but would be open to a stay in short term rehab if that is not possible. She stated she would like to think about SNF vs CIR as she feels pt. May not participate enough for CIR. I will also reach out to Pt.'s group home to see what Pt. Would need to be able to do in order to return. I await call backs from [facility] and group home.   Clemens Catholic, [place], [place] Admissions Coordinator  [phone] ([address]) [phone] (office)

## 2021-02-18 NOTE — Op Note (Signed)
°  OPERATIVE NOTE   PROCEDURE: Removal of Infuse-a-Port  PRE-OPERATIVE DIAGNOSIS: MSSA sepsis; breast carcinoma  POST-OPERATIVE DIAGNOSIS: Same  SURGEON: [name], M.D.  ANESTHESIA: Conscious sedation was administered under my direct supervision by the interventional radiology RN. IV Versed plus fentanyl were utilized. Continuous ECG, pulse oximetry and blood pressure was monitored throughout the entire procedure.  Conscious sedation was for a total of 20 minutes.   ESTIMATED BLOOD LOSS: Minimal   SPECIMEN(S):  Infuse-a-port intact  INDICATIONS:   Tamara Mejia is a 71 y.o. y.o. female who presents with positive blood cultures growing MSSA.  Port was placed for chemotherapy to treat breast carcinoma.  Patient is therefore undergoing removal of the port secondary to her sepsis. The risks and benefits of been reviewed all questions answered patient agrees to proceed with port removal   DESCRIPTION: After obtaining full informed written consent, the patient is brought to special procedures and positioned supine.  The patient received IV antibiotics.  The patient was prepped and draped in the standard fashion appropriate time out is called.    After infiltrating 1% lidocaine with epinephrine into the soft tissues and skin surrounding the port the previous incisional scar is reopened with an 11 blade scalpel.  The port is slipped from the pocket and subsequently removed without difficulty otherwise intact.  Pressure is held at the base of the neck for 5 minutes, a pocket is irrigated. Subtotally the wound is packed with saline moistened gauze and sterile dressings applied.  The patient tolerated the procedure without changes  COMPLICATIONS: None  CONDITION: Good  [name], M.D. [facility] Office: [phone]   02/18/2021, 11:25 AM

## 2021-02-18 NOTE — Progress Notes (Signed)
PT Cancellation Note  Patient Details Name: Tamara Mejia MRN: [id number] DOB: 09/01/1950   Cancelled Treatment:    Reason Eval/Treat Not Completed: Patient out of room at procedure or test and unavailable to participate with PT services. Will attempt to see pt at a future date/time as medically appropriate.     Linus Salmons PT, DPT 02/18/21, 12:25 PM

## 2021-02-18 NOTE — Progress Notes (Signed)
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  Imaging of the hip including MRI was consistent with muscle strain and likely bursitis.    Patient was started on empiric antibiotics on admission due to leukocytosis, tachycardia and concern for possible UTI and/or pneumonia.  Blood cultures subsequently positive for MSSA.    Admitted with sepsis due to MSSA bacteremia.  On IV antibiotics with further evaluation underway.  Assessment and Plan: * right Hip pain- (present on admission) Admitted for concern of right hip pain.  Patient has MSSA bacteremia.  Unclear if the patient has a septic hip.  MRI was negative for any fluid collections within the joint.  Etiology of her MSSA bacteremia still unclear, possibly line infection with her chemo port.  Infectious disease involved. Ortho consulted, MRI reviewed, not felt to be septic hip or related to infection. --Pain control --PT/OT --Ambulate as tolerated  Sepsis ([place])- (present on admission) POA as evidenced by leukocytosis and fever in setting of MSSA bacteremia.  Lactic acidosis negative.  Mgmt as outlined. Sepsis physiology improving, still tachycardic with low grade temps  Pneumonia- (present on admission) RULED OUT.  Initial CXR in ED felt possible PNA, but subsequent 2 view CXR ruled it out.  Initially on Rocephin Zithromax empirically.  Pt denies respiratory symptoms. --On Ancef  for MSSA bacteremia.   Hyponatremia- (present on admission) POA, improving with IV hydration.   TSH normal.  Was on HCTZ, held. Also on Tregretol which is can contribute. Na was 127 >> 131 >> 128 --Continue IV fluids, switch LR >> NS --Avoid thiazides --Monitor BMP  Acute anemia Hbg one month ago 12.6, slowly down-trending since.  On admission Hbg 10.7 >> 7.6 today.  Suspect due to chemotherapy (cycle 2 was on 1/12), no evidence or report of bleeding. --Anemia panel --Check for hemolysis, retic count --Peripheral smear  --Monitor Hbg --Transfuse if Hbg < 7  Hypomagnesemia- (present on admission) Resolved with replacement. Recurrent 1/30: Mg again 1.5, replaced. Monitor Mg, replace as needed.   Hypocalcemia- (present on admission) Ruled out.  Corrected calcium is within normal limits.  Hypokalemia- (present on admission) Resolved with replacement. Monitor K and replace as needed. 1/31: K 4.1  Abnormal urinalysis- (present on admission) Pt denies dysuria, frequency or other GU symptoms.  On antibiotics for MSSA bacteremia.  Leukocytosis- (present on admission) Resolved on antibiotics.   --Monitor CBC  OAB (overactive bladder)- (present on admission) Stable.  Appears not on bladder medications.  HTN (hypertension), benign- (present on admission) BP's improved but remain elevated. --Continue home lisinopril, amlodipine --Increase lisinopril to 40 mg, extra 20 mg this afternoon --HCTZ held due to hyponatremia --Monitor BP --PRN hydralazine  DMII (diabetes mellitus, type 2) (HCC) Stable.  Continue sliding scale Novolog. Adjust insulin for goal inpatient 140-180.  Hyperlipidemia- (present on admission) On statin.  Seizure ([place]) Continue Tegretol.  Hyponatremia improving on IV fluids, but Tegretol can also cause it.  Monitor for stability after Na normalized.    Severe obesity (  BMI 35.0-35.9 with comorbidity) ([place]) Complicates overall care and prognosis.   Recommend lifestyle modifications including physical activity and diet for weight loss and overall long-term health.   Malignant neoplasm of left breast in female, estrogen receptor positive ([place]) Stable.  Follows with Dr. Tasia Catchings.  Currently on chemotherapy which is now held until IV antibiotic course is complete and infection resolved.   Dr. Tasia Catchings notified of this admission. Port removed today 1/31.  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 Legal Guardian is Geraldo Pitter [phone]. PT/OT recommend CIR vs SNF  Pt hopes to return home. Jeannie contacting group home to see if feasible.  MSSA bacteremia- (present on admission) Source to be determined, suspect chemo port line infection.   Repeat cultures from 1/30 prior to port removal are again positive. --Continue Ancef.   --ID consulted, see their recommendations.   --TEE pending, planned for 2/1.   --Repeat cultures tomorrow w port now out  Tachycardia- (present on admission) Due to sepsis.  Treat underlying infection as outlined.  Telemetry.        Subjective: Patient awake resting in bed when seen today.  2 caregivers at bedside.  Patient has can be set up so she can eat lunch.  She denies fevers chills or shaking rigors today.  Denies any cough or congestion or other complaints.  Physical Exam: Vitals:   02/18/21 1045 02/18/21 1054 02/18/21 1111 02/18/21 1615  BP: (!) 141/74 130/62 (!) 147/70 140/65  Pulse: 100  (!) 104 (!) 107  Resp: 20 19  20   Temp:   98.9 F (37.2 C) 99.4 F (37.4 C)  TempSrc:   Oral   SpO2: 93% 93%  92%  Weight:      Height:       General exam: awake, alert, no acute distress, obese HEENT: moist mucus membranes, hearing grossly normal  Respiratory system: CTAB, no wheezes, rales or rhonchi, normal respiratory effort. Cardiovascular system: normal S1/S2, RRR, right upper chest port site with occlusive dressing Gastrointestinal system: soft, NT Central nervous system: A&O, no  gross focal neurologic deficits, normal speech Extremities: moves all, no edema, normal tone Skin: dry, intact, normal temperature Psychiatry: normal mood, congruent affect, abnormal judgement and insight due to intellectual delay   Data Reviewed:  Labs reviewed and notable for sodium 128, chloride 94, glucose 197, calcium 7.9, magnesium 1.6, albumin 1.9, iron low 14, TIBC 99, ferritin elevated 618, hemoglobin declining 7.6, immature reticulocyte fraction elevated 26.9  Family Communication: Two caregivers at bedside on rounds today.  Disposition: Status is: Inpatient Remains inpatient appropriate because: ongoing evaluation of MSSA bacteremia  Planned Discharge Destination:  CIR vs SNF            Time spent: 40 minutes  Author: Ezekiel Slocumb, DO 02/18/2021 4:32 PM  For on call review [email or web address].

## 2021-02-18 NOTE — Consult Note (Signed)
@LOGO @   MRN : [id number]  Tamara Mejia is a 71 y.o. (28-Feb-1950) female who presents with chief complaint of right hip pain and increased weakness.  History of Present Illness:  I am asked to see the patient by Dr. Claiborne Billings.  Patient is a 71 year old woman who presented to the emergency room here at [facility] yesterday with complaints of increasing right hip pain and increased generalized weakness.  She was also noted to be febrile which prompted blood cultures.  Blood cultures are positive for methicillin sensitive Staph aureus.  She has an Infuse-a-Port which was placed December 31, 2020 for treatment of her breast cancer.  She is being followed by infectious disease who is concerned that the port is now infected and must be removed.  Current Meds  Medication Sig   acetaminophen (TYLENOL) 500 MG tablet Take 1,000 mg by mouth every 8 (eight) hours as needed (pain.).   amLODipine (NORVASC) 5 MG tablet Take 5 mg by mouth in the morning.   aspirin 81 MG EC tablet Take 81 mg by mouth in the morning. Swallow whole.   carbamazepine (TEGRETOL) 200 MG tablet TAKE ONE TABLET BY MOUTH THREE TIMES DAILY. (SEIZURE CONTROL)   Cholecalciferol (VITAMIN D) 50 MCG (2000 UT) tablet Take 4,000 Units by mouth in the morning.   dexamethasone (DECADRON) 4 MG tablet Take 2 tablets (8 mg total) by mouth 2 (two) times daily. Start the day before Taxotere. Then again the day after chemo for 3 days.   HYDROcodone-acetaminophen (NORCO) 10-325 MG tablet Take 1 tablet by mouth every 6 (six) hours as needed.   ibuprofen (ADVIL) 200 MG tablet Take 400 mg by mouth every 8 (eight) hours as needed (pain.).   lidocaine-prilocaine (EMLA) cream Apply to affected area once   lisinopril-hydrochlorothiazide (ZESTORETIC) 20-12.5 MG tablet Take 1 tablet by mouth every morning.   loratadine (CLARITIN) 10 MG tablet Take 1 tablet (10 mg total) by mouth daily. Take Claritin daily for 4 days, Day1-4 of each chemo cycle.    metFORMIN (GLUCOPHAGE) 500 MG tablet Take by mouth 2 (two) times daily with a meal.   ondansetron (ZOFRAN) 8 MG tablet Take 1 tablet (8 mg total) by mouth 2 (two) times daily as needed for refractory nausea / vomiting. Start on day 3 after chemo.   prochlorperazine (COMPAZINE) 10 MG tablet Take 1 tablet (10 mg total) by mouth every 6 (six) hours as needed (Nausea or vomiting).   simvastatin (ZOCOR) 20 MG tablet Take 20 mg by mouth in the morning.    Past Medical History:  Diagnosis Date   Breast cancer ([place])    Cognitive dysfunction    mental retardation (mild to moderate)   Diabetes mellitus, type 2 (HCC)    diet controlled   Family history of breast cancer    High cholesterol    History of 2019 novel coronavirus disease (COVID-19) 11/04/2020   a.) resides in group home; others were sick with SARS-Cov-2. Home test (+).   Hypertension    Meningitis    in childhood   Obesity (BMI 30-39.9)    Overactive bladder    Seizure ([place])    last seizure 2000    Past Surgical History:  Procedure Laterality Date   ABDOMINAL HYSTERECTOMY     as a teenager   BREAST BIOPSY Right    neg   BREAST BIOPSY Left 10/18/2020   u/s breast  biopsy, "venus" clip-INVASIVE MAMMARY CARCINOMA   CHOLECYSTECTOMY     COLONOSCOPY WITH PROPOFOL  N/A 08/27/2015   Procedure: COLONOSCOPY WITH PROPOFOL;  Surgeon: Lucilla Lame, MD;  Location: [facility] ENDOSCOPY;  Service: Endoscopy;  Laterality: N/A;   PART MASTECTOMY,RADIO FREQUENCY LOCALIZER,AXILLARY SENTINEL NODE BIOPSY Left 11/22/2020   Procedure: PART MASTECTOMY, RADIO FREQUENCY LOCALIZER,AXILLARY SENTINEL NODE BIOPSY;  Surgeon: Herbert Pun, MD;  Location: [facility] ORS;  Service: General;  Laterality: Left;   PORTACATH PLACEMENT N/A 12/31/2020   Procedure: INSERTION PORT-A-CATH;  Surgeon: Herbert Pun, MD;  Location: [facility] ORS;  Service: General;  Laterality: N/A;    Social History Social History   Tobacco Use   Smoking status: Never   Smokeless  tobacco: Never  Substance Use Topics   Alcohol use: No   Drug use: No    Family History Family History  Problem Relation Age of Onset   Diabetes Father    Heart disease Father    Heart disease Brother    Breast cancer Cousin        dx 11s    No Known Allergies   REVIEW OF SYSTEMS (Negative unless checked)  Constitutional: [] Weight loss  [] Fever  [] Chills Cardiac: [] Chest pain   [] Chest pressure   [] Palpitations   [] Shortness of breath when laying flat   [] Shortness of breath with exertion. Vascular:  [] Pain in legs with walking   [] Pain in legs at rest  [] History of DVT   [] Phlebitis   [] Swelling in legs   [] Varicose veins   [] Non-healing ulcers Pulmonary:   [] Uses home oxygen   [] Productive cough   [] Hemoptysis   [] Wheeze  [] COPD   [] Asthma Neurologic:  [] Dizziness   [] Seizures   [] History of stroke   [] History of TIA  [] Aphasia   [] Vissual changes   [] Weakness or numbness in arm   [] Weakness or numbness in leg Musculoskeletal:   [] Joint swelling   [x] Joint pain   [] Low back pain Hematologic:  [] Easy bruising  [] Easy bleeding   [] Hypercoagulable state   [] Anemic Gastrointestinal:  [] Diarrhea   [] Vomiting  [] Gastroesophageal reflux/heartburn   [] Difficulty swallowing. Genitourinary:  [] Chronic kidney disease   [] Difficult urination  [] Frequent urination   [] Blood in urine Skin:  [] Rashes   [] Ulcers  Psychological:  [] History of anxiety   []  History of major depression.  Physical Examination  Vitals:   02/18/21 0044 02/18/21 0256 02/18/21 0541 02/18/21 0839  BP: (!) 159/77 (!) 167/81 (!) 143/68 138/70  Pulse: (!) 113 (!) 112 (!) 117 (!) 106  Resp: (!) 24 (!) 21 20 16   Temp: 100.2 F (37.9 C) 100.3 F (37.9 C) 99.8 F (37.7 C) 98.2 F (36.8 C)  TempSrc: Oral Oral    SpO2: 100% 94% 93% 93%  Weight:      Height:       Body mass index is 34.96 kg/m. Gen: WD/WN, NAD Head: [place]/AT, No temporalis wasting.  Ear/Nose/Throat: Hearing grossly intact, nares w/o erythema or  drainage, pinna without lesions Eyes: PER, EOMI, sclera nonicteric.  Neck: Supple, no gross masses.  No JVD.  Pulmonary:  Good air movement, no audible wheezing, no use of accessory muscles.  Cardiac: RRR, precordium not hyperdynamic. Vascular: Right IJ Infuse-a-Port appears clean dry and intact Vessel Right Left  Radial Palpable Palpable  Gastrointestinal: soft, non-distended. No guarding/no peritoneal signs.  Musculoskeletal: M/S 5/5 throughout.  No deformity.  Neurologic: CN 2-12 intact. Pain and light touch intact in extremities.  Symmetrical.  Speech is fluent. Motor exam as listed above. Psychiatric: Judgment intact, Mood & affect appropriate for pt's clinical situation. Dermatologic: Venous rashes no ulcers  noted.  No changes consistent with cellulitis. Lymph : No lichenification or skin changes of chronic lymphedema.  CBC Lab Results  Component Value Date   WBC 9.2 02/18/2021   HGB 7.6 (L) 02/18/2021   HCT 22.9 (L) 02/18/2021   MCV 89.8 02/18/2021   PLT 375 02/18/2021    BMET    Component Value Date/Time   NA 128 (L) 02/18/2021 0422   NA 142 01/02/2021 1013   NA 139 12/22/2011 2245   K 4.2 02/18/2021 0422   K 3.8 12/22/2011 2245   CL 94 (L) 02/18/2021 0422   CL 104 12/22/2011 2245   CO2 26 02/18/2021 0422   CO2 29 12/22/2011 2245   GLUCOSE 197 (H) 02/18/2021 0422   GLUCOSE 100 (H) 12/22/2011 2245   BUN 13 02/18/2021 0422   BUN 10 01/02/2021 1013   BUN 18 12/22/2011 2245   CREATININE 0.63 02/18/2021 0422   CREATININE 0.81 12/22/2011 2245   CALCIUM 7.9 (L) 02/18/2021 0422   CALCIUM 9.1 12/22/2011 2245   GFRNONAA >60 02/18/2021 0422   GFRNONAA >60 12/22/2011 2245   GFRAA 95 12/28/2019 1000   GFRAA >60 12/22/2011 2245   Estimated Creatinine Clearance: 66.1 mL/min (by C-G formula based on SCr of 0.63 mg/dL).  COAG No results found for: INR, PROTIME  Radiology DG Chest 2 View  Result Date: 02/14/2021 CLINICAL DATA:  Abnormal chest x-ray, weak, hyponatremia  EXAM: CHEST - 2 VIEW COMPARISON:  02/14/2021, 12/31/2020 FINDINGS: Frontal and lateral views of the chest demonstrates stable right chest wall port. Cardiac silhouette is unremarkable. Patient is slightly rotated to the right, accentuating the vascular markings at the right lung base. No airspace disease identified on lateral view. No effusion or pneumothorax. No acute bony abnormalities. IMPRESSION: 1. No acute airspace disease. 2. Densities in the medial right lung base on prior exam correspond to prominent vascular shadows and superimposed structures on this study. Electronically Signed   By: Randa Ngo M.D.   On: 02/14/2021 18:42   MR HIP RIGHT WO CONTRAST  Result Date: 02/14/2021 CLINICAL DATA:  Hip pain for 1 week, pain with ambulation EXAM: MR OF THE RIGHT HIP WITHOUT CONTRAST TECHNIQUE: Multiplanar, multisequence MR imaging was performed. No intravenous contrast was administered. COMPARISON:  02/14/2021 FINDINGS: Bones: There are no acute or destructive bony lesions. No abnormal signal. Articular cartilage and labrum Articular cartilage:  No gross abnormalities. Labrum:  Limited visualization. Joint or bursal effusion Joint effusion:  Trace symmetrical bilateral hip effusions. Bursae: There is a small amount of fluid medial to the right ischial tuberosity near the insertion of the obturator internus muscle, which may reflect bursitis. Muscles and tendons Muscles and tendons: Minimal edema within the insertion of the right obturator internus muscle near the ischial tuberosity. Fluid signal along the shield tuberosities may also reflect superimposed bursitis. Remaining muscular structures are unremarkable. Other findings Miscellaneous:   Visualized intrapelvic structures are unremarkable. IMPRESSION: 1. Minimal fluid signal within the insertion of the right obturator internus muscle, as well as along the medial margin of the right ischial tuberosity. Findings consistent with muscular strain and  associated obturator internus bursitis. 2. No acute or destructive bony lesions. Electronically Signed   By: Randa Ngo M.D.   On: 02/14/2021 20:25   DG Chest Portable 1 View  Result Date: 02/14/2021 CLINICAL DATA:  Weak and hyponatremic EXAM: PORTABLE CHEST 1 VIEW.  Patient is slightly rotated. COMPARISON:  Chest x-ray 12/31/2020. FINDINGS: Right chest wall Port-A-Cath with tip overlying the expected region  of the distal superior vena cava just proximal to the superior cavoatrial junction. Poorly visualized right heart border. Otherwise the heart and mediastinal contours are unchanged. Aortic calcification. Query right middle lobe opacity. No pulmonary edema. No pleural effusion. No pneumothorax. No acute osseous abnormality. IMPRESSION: 1. Query right middle lobe opacity. Recommend repeat PA and lateral view of the chest. 2.  Aortic Atherosclerosis (ICD10-I70.0). Electronically Signed   By: Iven Finn M.D.   On: 02/14/2021 17:59   ECHOCARDIOGRAM COMPLETE  Result Date: 02/17/2021    ECHOCARDIOGRAM REPORT   Patient Name:   Tamara Mejia Geppert Date of Exam: 02/17/2021 Medical Rec #:  [id number]      Height:       61.0 in Accession #:    [id number]     Weight:       185.0 lb Date of Birth:  April 07, 1950       BSA:          1.827 m Patient Age:    89 years       BP:           158/87 mmHg Patient Gender: F              HR:           113 bpm. Exam Location:  [facility] Procedure: 2D Echo, Color Doppler and Cardiac Doppler Indications:     R78.81 Bacteremia  History:         Patient has prior history of Echocardiogram examinations, most                  recent 01/01/2021. Risk Factors:Hypertension, Diabetes and HCL.  Sonographer:     Charmayne Sheer Referring Phys:  [id number] Claiborne Billings A GRIFFITH Diagnosing Phys: Kathlyn Sacramento MD  Sonographer Comments: Suboptimal apical window and no subcostal window. Image acquisition challenging due to patient body habitus. IMPRESSIONS  1. Left ventricular ejection fraction, by estimation, is 60  to 65%. The left ventricle has normal function. The left ventricle has no regional wall motion abnormalities. There is mild left ventricular hypertrophy. Left ventricular diastolic parameters are consistent with Grade I diastolic dysfunction (impaired relaxation).  2. Right ventricular systolic function is normal. The right ventricular size is normal. Tricuspid regurgitation signal is inadequate for assessing PA pressure.  3. The mitral valve is normal in structure. No evidence of mitral valve regurgitation. No evidence of mitral stenosis.  4. The aortic valve is normal in structure. Aortic valve regurgitation is not visualized. No aortic stenosis is present.  5. No clear vegetations but overall suboptimal study. FINDINGS  Left Ventricle: Left ventricular ejection fraction, by estimation, is 60 to 65%. The left ventricle has normal function. The left ventricle has no regional wall motion abnormalities. The left ventricular internal cavity size was normal in size. There is  mild left ventricular hypertrophy. Left ventricular diastolic parameters are consistent with Grade I diastolic dysfunction (impaired relaxation). Right Ventricle: The right ventricular size is normal. No increase in right ventricular wall thickness. Right ventricular systolic function is normal. Tricuspid regurgitation signal is inadequate for assessing PA pressure. Left Atrium: Left atrial size was normal in size. Right Atrium: Right atrial size was normal in size. Pericardium: There is no evidence of pericardial effusion. Mitral Valve: The mitral valve is normal in structure. No evidence of mitral valve regurgitation. No evidence of mitral valve stenosis. MV peak gradient, 6.7 mmHg. The mean mitral valve gradient is 4.0 mmHg. Tricuspid Valve: The tricuspid valve is normal in structure. Tricuspid  valve regurgitation is not demonstrated. No evidence of tricuspid stenosis. Aortic Valve: The aortic valve is normal in structure. Aortic valve  regurgitation is not visualized. No aortic stenosis is present. Aortic valve mean gradient measures 6.0 mmHg. Aortic valve peak gradient measures 11.6 mmHg. Aortic valve area, by VTI measures 1.95 cm. Pulmonic Valve: The pulmonic valve was normal in structure. Pulmonic valve regurgitation is not visualized. No evidence of pulmonic stenosis. Aorta: The aortic root is normal in size and structure. Venous: The inferior vena cava was not well visualized. IAS/Shunts: No atrial level shunt detected by color flow Doppler.  LEFT VENTRICLE PLAX 2D LVIDd:         4.30 cm   Diastology LVIDs:         2.74 cm   LV e' medial:    7.29 cm/s LV PW:         1.09 cm   LV E/e' medial:  11.6 LV IVS:        0.78 cm   LV e' lateral:   9.57 cm/s LVOT diam:     1.80 cm   LV E/e' lateral: 8.9 LV SV:         47 LV SV Index:   26 LVOT Area:     2.54 cm  RIGHT VENTRICLE RV Basal diam:  3.77 cm LEFT ATRIUM           Index LA diam:      2.90 cm 1.59 cm/m LA Vol (A4C): 37.1 ml 20.30 ml/m  AORTIC VALVE                     PULMONIC VALVE AV Area (Vmax):    1.93 cm      PV Vmax:       1.18 m/s AV Area (Vmean):   1.94 cm      PV Vmean:      78.200 cm/s AV Area (VTI):     1.95 cm      PV VTI:        0.188 m AV Vmax:           170.00 cm/s   PV Peak grad:  5.6 mmHg AV Vmean:          112.000 cm/s  PV Mean grad:  3.0 mmHg AV VTI:            0.242 m AV Peak Grad:      11.6 mmHg AV Mean Grad:      6.0 mmHg LVOT Vmax:         129.00 cm/s LVOT Vmean:        85.200 cm/s LVOT VTI:          0.185 m LVOT/AV VTI ratio: 0.76  AORTA Ao Root diam: 3.00 cm MITRAL VALVE MV Area (PHT): 6.32 cm     SHUNTS MV Area VTI:   2.27 cm     Systemic VTI:  0.18 m MV Peak grad:  6.7 mmHg     Systemic Diam: 1.80 cm MV Mean grad:  4.0 mmHg MV Vmax:       1.29 m/s MV Vmean:      88.8 cm/s MV Decel Time: 120 msec MV E velocity: 84.80 cm/s MV A velocity: 114.00 cm/s MV E/A ratio:  0.74 Kathlyn Sacramento MD Electronically signed by Kathlyn Sacramento MD Signature Date/Time:  02/17/2021/1:06:03 PM    Final    DG Hip Unilat W or Wo Pelvis 2-3 Views Right  Result Date: 02/14/2021  CLINICAL DATA:  Right hip pain EXAM: DG HIP (WITH OR WITHOUT PELVIS) 2-3V RIGHT COMPARISON:  None. FINDINGS: No recent fracture or dislocation is seen. Degenerative changes are noted with small bony spurs. Degenerative changes are noted in the pubic symphysis and visualized lower lumbar spine. IMPRESSION: No recent fracture or dislocation is seen in the right hip. Degenerative changes with small bony spurs are noted in the right hip. Electronically Signed   By: Elmer Picker M.D.   On: 02/14/2021 15:39     Assessment/Plan Methicillin sensitive Staph aureus bacteremia: Given the positive blood cultures the Infuse-a-Port should be removed.  Arrangements will be made for port removal and the tip will be cultured.  Risks and benefits of been reviewed all questions answered patient has agreed to proceed.  2.  Hypertension: Continue antihypertensive medications as already ordered, these medications have been reviewed and there are no changes at this time.   3.  Diabetes mellitus: Continue hypoglycemic medications as already ordered, these medications have been reviewed and there are no changes at this time.  Hgb A1C to be monitored as already arranged by primary service.  4.  Breast carcinoma: We will coordinate with oncology.  Once the patient's sepsis has been adequately treated a new Infuse-a-Port will likely be needed.  Patient has ongoing chemotherapy issues for further treatment of her malignant breast carcinoma.  [name], MD  02/18/2021 9:23 AM

## 2021-02-18 NOTE — Interval H&P Note (Signed)
History and Physical Interval Note:  02/18/2021 9:59 AM  Tamara Mejia  has presented today for surgery, with the diagnosis of infected port.  The various methods of treatment have been discussed with the patient and family. After consideration of risks, benefits and other options for treatment, the patient has consented to  Procedure(s): PORTA CATH REMOVAL (N/A) as a surgical intervention.  The patient's history has been reviewed, patient examined, no change in status, stable for surgery.  I have reviewed the patient's chart and labs.  Questions were answered to the patient's satisfaction.     [name]

## 2021-02-18 NOTE — H&P (View-Only) (Signed)
@LOGO @   MRN : [id number]  Tamara Mejia is a 71 y.o. (Dec 05, 1950) female who presents with chief complaint of right hip pain and increased weakness.  History of Present Illness:  I am asked to see the patient by Dr. Claiborne Billings.  Patient is a 71 year old woman who presented to the emergency room here at [facility] yesterday with complaints of increasing right hip pain and increased generalized weakness.  She was also noted to be febrile which prompted blood cultures.  Blood cultures are positive for methicillin sensitive Staph aureus.  She has an Infuse-a-Port which was placed December 31, 2020 for treatment of her breast cancer.  She is being followed by infectious disease who is concerned that the port is now infected and must be removed.  Current Meds  Medication Sig   acetaminophen (TYLENOL) 500 MG tablet Take 1,000 mg by mouth every 8 (eight) hours as needed (pain.).   amLODipine (NORVASC) 5 MG tablet Take 5 mg by mouth in the morning.   aspirin 81 MG EC tablet Take 81 mg by mouth in the morning. Swallow whole.   carbamazepine (TEGRETOL) 200 MG tablet TAKE ONE TABLET BY MOUTH THREE TIMES DAILY. (SEIZURE CONTROL)   Cholecalciferol (VITAMIN D) 50 MCG (2000 UT) tablet Take 4,000 Units by mouth in the morning.   dexamethasone (DECADRON) 4 MG tablet Take 2 tablets (8 mg total) by mouth 2 (two) times daily. Start the day before Taxotere. Then again the day after chemo for 3 days.   HYDROcodone-acetaminophen (NORCO) 10-325 MG tablet Take 1 tablet by mouth every 6 (six) hours as needed.   ibuprofen (ADVIL) 200 MG tablet Take 400 mg by mouth every 8 (eight) hours as needed (pain.).   lidocaine-prilocaine (EMLA) cream Apply to affected area once   lisinopril-hydrochlorothiazide (ZESTORETIC) 20-12.5 MG tablet Take 1 tablet by mouth every morning.   loratadine (CLARITIN) 10 MG tablet Take 1 tablet (10 mg total) by mouth daily. Take Claritin daily for 4 days, Day1-4 of each chemo cycle.    metFORMIN (GLUCOPHAGE) 500 MG tablet Take by mouth 2 (two) times daily with a meal.   ondansetron (ZOFRAN) 8 MG tablet Take 1 tablet (8 mg total) by mouth 2 (two) times daily as needed for refractory nausea / vomiting. Start on day 3 after chemo.   prochlorperazine (COMPAZINE) 10 MG tablet Take 1 tablet (10 mg total) by mouth every 6 (six) hours as needed (Nausea or vomiting).   simvastatin (ZOCOR) 20 MG tablet Take 20 mg by mouth in the morning.    Past Medical History:  Diagnosis Date   Breast cancer ([place])    Cognitive dysfunction    mental retardation (mild to moderate)   Diabetes mellitus, type 2 (HCC)    diet controlled   Family history of breast cancer    High cholesterol    History of 2019 novel coronavirus disease (COVID-19) 11/04/2020   a.) resides in group home; others were sick with SARS-Cov-2. Home test (+).   Hypertension    Meningitis    in childhood   Obesity (BMI 30-39.9)    Overactive bladder    Seizure ([place])    last seizure 2000    Past Surgical History:  Procedure Laterality Date   ABDOMINAL HYSTERECTOMY     as a teenager   BREAST BIOPSY Right    neg   BREAST BIOPSY Left 10/18/2020   u/s breast  biopsy, "venus" clip-INVASIVE MAMMARY CARCINOMA   CHOLECYSTECTOMY     COLONOSCOPY WITH PROPOFOL  N/A 08/27/2015   Procedure: COLONOSCOPY WITH PROPOFOL;  Surgeon: Lucilla Lame, MD;  Location: [facility];  Service: Endoscopy;  Laterality: N/A;   PART MASTECTOMY,RADIO FREQUENCY LOCALIZER,AXILLARY SENTINEL NODE BIOPSY Left 11/22/2020   Procedure: PART MASTECTOMY, RADIO FREQUENCY LOCALIZER,AXILLARY SENTINEL NODE BIOPSY;  Surgeon: Herbert Pun, MD;  Location: [facility];  Service: General;  Laterality: Left;   PORTACATH PLACEMENT N/A 12/31/2020   Procedure: INSERTION PORT-A-CATH;  Surgeon: Herbert Pun, MD;  Location: [facility] ORS;  Service: General;  Laterality: N/A;    Social History Social History   Tobacco Use   Smoking status: Never   Smokeless  tobacco: Never  Substance Use Topics   Alcohol use: No   Drug use: No    Family History Family History  Problem Relation Age of Onset   Diabetes Father    Heart disease Father    Heart disease Brother    Breast cancer Cousin        dx 39s    No Known Allergies   REVIEW OF SYSTEMS (Negative unless checked)  Constitutional: [] Weight loss  [] Fever  [] Chills Cardiac: [] Chest pain   [] Chest pressure   [] Palpitations   [] Shortness of breath when laying flat   [] Shortness of breath with exertion. Vascular:  [] Pain in legs with walking   [] Pain in legs at rest  [] History of DVT   [] Phlebitis   [] Swelling in legs   [] Varicose veins   [] Non-healing ulcers Pulmonary:   [] Uses home oxygen   [] Productive cough   [] Hemoptysis   [] Wheeze  [] COPD   [] Asthma Neurologic:  [] Dizziness   [] Seizures   [] History of stroke   [] History of TIA  [] Aphasia   [] Vissual changes   [] Weakness or numbness in arm   [] Weakness or numbness in leg Musculoskeletal:   [] Joint swelling   [x] Joint pain   [] Low back pain Hematologic:  [] Easy bruising  [] Easy bleeding   [] Hypercoagulable state   [] Anemic Gastrointestinal:  [] Diarrhea   [] Vomiting  [] Gastroesophageal reflux/heartburn   [] Difficulty swallowing. Genitourinary:  [] Chronic kidney disease   [] Difficult urination  [] Frequent urination   [] Blood in urine Skin:  [] Rashes   [] Ulcers  Psychological:  [] History of anxiety   []  History of major depression.  Physical Examination  Vitals:   02/18/21 0044 02/18/21 0256 02/18/21 0541 02/18/21 0839  BP: (!) 159/77 (!) 167/81 (!) 143/68 138/70  Pulse: (!) 113 (!) 112 (!) 117 (!) 106  Resp: (!) 24 (!) 21 20 16   Temp: 100.2 F (37.9 C) 100.3 F (37.9 C) 99.8 F (37.7 C) 98.2 F (36.8 C)  TempSrc: Oral Oral    SpO2: 100% 94% 93% 93%  Weight:      Height:       Body mass index is 34.96 kg/m. Gen: WD/WN, NAD Head: [place]/AT, No temporalis wasting.  Ear/Nose/Throat: Hearing grossly intact, nares w/o erythema or  drainage, pinna without lesions Eyes: PER, EOMI, sclera nonicteric.  Neck: Supple, no gross masses.  No JVD.  Pulmonary:  Good air movement, no audible wheezing, no use of accessory muscles.  Cardiac: RRR, precordium not hyperdynamic. Vascular: Right IJ Infuse-a-Port appears clean dry and intact Vessel Right Left  Radial Palpable Palpable  Gastrointestinal: soft, non-distended. No guarding/no peritoneal signs.  Musculoskeletal: M/S 5/5 throughout.  No deformity.  Neurologic: CN 2-12 intact. Pain and light touch intact in extremities.  Symmetrical.  Speech is fluent. Motor exam as listed above. Psychiatric: Judgment intact, Mood & affect appropriate for pt's clinical situation. Dermatologic: Venous rashes no ulcers  noted.  No changes consistent with cellulitis. Lymph : No lichenification or skin changes of chronic lymphedema.  CBC Lab Results  Component Value Date   WBC 9.2 02/18/2021   HGB 7.6 (L) 02/18/2021   HCT 22.9 (L) 02/18/2021   MCV 89.8 02/18/2021   PLT 375 02/18/2021    BMET    Component Value Date/Time   NA 128 (L) 02/18/2021 0422   NA 142 01/02/2021 1013   NA 139 12/22/2011 2245   K 4.2 02/18/2021 0422   K 3.8 12/22/2011 2245   CL 94 (L) 02/18/2021 0422   CL 104 12/22/2011 2245   CO2 26 02/18/2021 0422   CO2 29 12/22/2011 2245   GLUCOSE 197 (H) 02/18/2021 0422   GLUCOSE 100 (H) 12/22/2011 2245   BUN 13 02/18/2021 0422   BUN 10 01/02/2021 1013   BUN 18 12/22/2011 2245   CREATININE 0.63 02/18/2021 0422   CREATININE 0.81 12/22/2011 2245   CALCIUM 7.9 (L) 02/18/2021 0422   CALCIUM 9.1 12/22/2011 2245   GFRNONAA >60 02/18/2021 0422   GFRNONAA >60 12/22/2011 2245   GFRAA 95 12/28/2019 1000   GFRAA >60 12/22/2011 2245   Estimated Creatinine Clearance: 66.1 mL/min (by C-G formula based on SCr of 0.63 mg/dL).  COAG No results found for: INR, PROTIME  Radiology DG Chest 2 View  Result Date: 02/14/2021 CLINICAL DATA:  Abnormal chest x-ray, weak, hyponatremia  EXAM: CHEST - 2 VIEW COMPARISON:  02/14/2021, 12/31/2020 FINDINGS: Frontal and lateral views of the chest demonstrates stable right chest wall port. Cardiac silhouette is unremarkable. Patient is slightly rotated to the right, accentuating the vascular markings at the right lung base. No airspace disease identified on lateral view. No effusion or pneumothorax. No acute bony abnormalities. IMPRESSION: 1. No acute airspace disease. 2. Densities in the medial right lung base on prior exam correspond to prominent vascular shadows and superimposed structures on this study. Electronically Signed   By: Randa Ngo M.D.   On: 02/14/2021 18:42   MR HIP RIGHT WO CONTRAST  Result Date: 02/14/2021 CLINICAL DATA:  Hip pain for 1 week, pain with ambulation EXAM: MR OF THE RIGHT HIP WITHOUT CONTRAST TECHNIQUE: Multiplanar, multisequence MR imaging was performed. No intravenous contrast was administered. COMPARISON:  02/14/2021 FINDINGS: Bones: There are no acute or destructive bony lesions. No abnormal signal. Articular cartilage and labrum Articular cartilage:  No gross abnormalities. Labrum:  Limited visualization. Joint or bursal effusion Joint effusion:  Trace symmetrical bilateral hip effusions. Bursae: There is a small amount of fluid medial to the right ischial tuberosity near the insertion of the obturator internus muscle, which may reflect bursitis. Muscles and tendons Muscles and tendons: Minimal edema within the insertion of the right obturator internus muscle near the ischial tuberosity. Fluid signal along the shield tuberosities may also reflect superimposed bursitis. Remaining muscular structures are unremarkable. Other findings Miscellaneous:   Visualized intrapelvic structures are unremarkable. IMPRESSION: 1. Minimal fluid signal within the insertion of the right obturator internus muscle, as well as along the medial margin of the right ischial tuberosity. Findings consistent with muscular strain and  associated obturator internus bursitis. 2. No acute or destructive bony lesions. Electronically Signed   By: Randa Ngo M.D.   On: 02/14/2021 20:25   DG Chest Portable 1 View  Result Date: 02/14/2021 CLINICAL DATA:  Weak and hyponatremic EXAM: PORTABLE CHEST 1 VIEW.  Patient is slightly rotated. COMPARISON:  Chest x-ray 12/31/2020. FINDINGS: Right chest wall Port-A-Cath with tip overlying the expected region  of the distal superior vena cava just proximal to the superior cavoatrial junction. Poorly visualized right heart border. Otherwise the heart and mediastinal contours are unchanged. Aortic calcification. Query right middle lobe opacity. No pulmonary edema. No pleural effusion. No pneumothorax. No acute osseous abnormality. IMPRESSION: 1. Query right middle lobe opacity. Recommend repeat PA and lateral view of the chest. 2.  Aortic Atherosclerosis (ICD10-I70.0). Electronically Signed   By: Iven Finn M.D.   On: 02/14/2021 17:59   ECHOCARDIOGRAM COMPLETE  Result Date: 02/17/2021    ECHOCARDIOGRAM REPORT   Patient Name:   Tamara Mejia Date of Exam: 02/17/2021 Medical Rec #:  [id number]      Height:       61.0 in Accession #:    [id number]     Weight:       185.0 lb Date of Birth:  1950-03-14       BSA:          1.827 m Patient Age:    77 years       BP:           158/87 mmHg Patient Gender: F              HR:           113 bpm. Exam Location:  [facility] Procedure: 2D Echo, Color Doppler and Cardiac Doppler Indications:     R78.81 Bacteremia  History:         Patient has prior history of Echocardiogram examinations, most                  recent 01/01/2021. Risk Factors:Hypertension, Diabetes and HCL.  Sonographer:     Charmayne Sheer Referring Phys:  [id number] Claiborne Billings A GRIFFITH Diagnosing Phys: Kathlyn Sacramento MD  Sonographer Comments: Suboptimal apical window and no subcostal window. Image acquisition challenging due to patient body habitus. IMPRESSIONS  1. Left ventricular ejection fraction, by estimation, is 60  to 65%. The left ventricle has normal function. The left ventricle has no regional wall motion abnormalities. There is mild left ventricular hypertrophy. Left ventricular diastolic parameters are consistent with Grade I diastolic dysfunction (impaired relaxation).  2. Right ventricular systolic function is normal. The right ventricular size is normal. Tricuspid regurgitation signal is inadequate for assessing PA pressure.  3. The mitral valve is normal in structure. No evidence of mitral valve regurgitation. No evidence of mitral stenosis.  4. The aortic valve is normal in structure. Aortic valve regurgitation is not visualized. No aortic stenosis is present.  5. No clear vegetations but overall suboptimal study. FINDINGS  Left Ventricle: Left ventricular ejection fraction, by estimation, is 60 to 65%. The left ventricle has normal function. The left ventricle has no regional wall motion abnormalities. The left ventricular internal cavity size was normal in size. There is  mild left ventricular hypertrophy. Left ventricular diastolic parameters are consistent with Grade I diastolic dysfunction (impaired relaxation). Right Ventricle: The right ventricular size is normal. No increase in right ventricular wall thickness. Right ventricular systolic function is normal. Tricuspid regurgitation signal is inadequate for assessing PA pressure. Left Atrium: Left atrial size was normal in size. Right Atrium: Right atrial size was normal in size. Pericardium: There is no evidence of pericardial effusion. Mitral Valve: The mitral valve is normal in structure. No evidence of mitral valve regurgitation. No evidence of mitral valve stenosis. MV peak gradient, 6.7 mmHg. The mean mitral valve gradient is 4.0 mmHg. Tricuspid Valve: The tricuspid valve is normal in structure. Tricuspid  valve regurgitation is not demonstrated. No evidence of tricuspid stenosis. Aortic Valve: The aortic valve is normal in structure. Aortic valve  regurgitation is not visualized. No aortic stenosis is present. Aortic valve mean gradient measures 6.0 mmHg. Aortic valve peak gradient measures 11.6 mmHg. Aortic valve area, by VTI measures 1.95 cm. Pulmonic Valve: The pulmonic valve was normal in structure. Pulmonic valve regurgitation is not visualized. No evidence of pulmonic stenosis. Aorta: The aortic root is normal in size and structure. Venous: The inferior vena cava was not well visualized. IAS/Shunts: No atrial level shunt detected by color flow Doppler.  LEFT VENTRICLE PLAX 2D LVIDd:         4.30 cm   Diastology LVIDs:         2.74 cm   LV e' medial:    7.29 cm/s LV PW:         1.09 cm   LV E/e' medial:  11.6 LV IVS:        0.78 cm   LV e' lateral:   9.57 cm/s LVOT diam:     1.80 cm   LV E/e' lateral: 8.9 LV SV:         47 LV SV Index:   26 LVOT Area:     2.54 cm  RIGHT VENTRICLE RV Basal diam:  3.77 cm LEFT ATRIUM           Index LA diam:      2.90 cm 1.59 cm/m LA Vol (A4C): 37.1 ml 20.30 ml/m  AORTIC VALVE                     PULMONIC VALVE AV Area (Vmax):    1.93 cm      PV Vmax:       1.18 m/s AV Area (Vmean):   1.94 cm      PV Vmean:      78.200 cm/s AV Area (VTI):     1.95 cm      PV VTI:        0.188 m AV Vmax:           170.00 cm/s   PV Peak grad:  5.6 mmHg AV Vmean:          112.000 cm/s  PV Mean grad:  3.0 mmHg AV VTI:            0.242 m AV Peak Grad:      11.6 mmHg AV Mean Grad:      6.0 mmHg LVOT Vmax:         129.00 cm/s LVOT Vmean:        85.200 cm/s LVOT VTI:          0.185 m LVOT/AV VTI ratio: 0.76  AORTA Ao Root diam: 3.00 cm MITRAL VALVE MV Area (PHT): 6.32 cm     SHUNTS MV Area VTI:   2.27 cm     Systemic VTI:  0.18 m MV Peak grad:  6.7 mmHg     Systemic Diam: 1.80 cm MV Mean grad:  4.0 mmHg MV Vmax:       1.29 m/s MV Vmean:      88.8 cm/s MV Decel Time: 120 msec MV E velocity: 84.80 cm/s MV A velocity: 114.00 cm/s MV E/A ratio:  0.74 Kathlyn Sacramento MD Electronically signed by Kathlyn Sacramento MD Signature Date/Time:  02/17/2021/1:06:03 PM    Final    DG Hip Unilat W or Wo Pelvis 2-3 Views Right  Result Date: 02/14/2021  CLINICAL DATA:  Right hip pain EXAM: DG HIP (WITH OR WITHOUT PELVIS) 2-3V RIGHT COMPARISON:  None. FINDINGS: No recent fracture or dislocation is seen. Degenerative changes are noted with small bony spurs. Degenerative changes are noted in the pubic symphysis and visualized lower lumbar spine. IMPRESSION: No recent fracture or dislocation is seen in the right hip. Degenerative changes with small bony spurs are noted in the right hip. Electronically Signed   By: Elmer Picker M.D.   On: 02/14/2021 15:39     Assessment/Plan Methicillin sensitive Staph aureus bacteremia: Given the positive blood cultures the Infuse-a-Port should be removed.  Arrangements will be made for port removal and the tip will be cultured.  Risks and benefits of been reviewed all questions answered patient has agreed to proceed.  2.  Hypertension: Continue antihypertensive medications as already ordered, these medications have been reviewed and there are no changes at this time.   3.  Diabetes mellitus: Continue hypoglycemic medications as already ordered, these medications have been reviewed and there are no changes at this time.  Hgb A1C to be monitored as already arranged by primary service.  4.  Breast carcinoma: We will coordinate with oncology.  Once the patient's sepsis has been adequately treated a new Infuse-a-Port will likely be needed.  Patient has ongoing chemotherapy issues for further treatment of her malignant breast carcinoma.  [name], MD  02/18/2021 9:23 AM

## 2021-02-18 NOTE — Assessment & Plan Note (Addendum)
Last hemoglobin 8.6.  Suspect this is secondary to chemotherapy.  Peripheral smear shows rouleaux formation which likely goes along with infection.  Iron studies consistent with anemia of chronic disease.

## 2021-02-18 NOTE — Progress Notes (Signed)
ID Pt doing much better No fever or chills Since last night once port was deaccessed PORT removed today  O/e awake and alert No discomfort  Patient Vitals for the past 24 hrs:  BP Temp Temp src Pulse Resp SpO2  02/18/21 1615 140/65 99.4 F (37.4 C) -- (!) 107 20 92 %  02/18/21 1111 (!) 147/70 98.9 F (37.2 C) Oral (!) 104 -- --  02/18/21 1054 130/62 -- -- -- 19 93 %  02/18/21 1045 (!) 141/74 -- -- 100 20 93 %  02/18/21 1030 (!) 145/68 -- -- (!) 106 20 93 %  02/18/21 1025 (!) 153/76 -- -- (!) 106 20 93 %  02/18/21 0940 (!) 151/85 -- -- -- -- --  02/18/21 0934 -- 99.8 F (37.7 C) Oral (!) 107 18 92 %  02/18/21 0839 138/70 98.2 F (36.8 C) -- (!) 106 16 93 %  02/18/21 0541 (!) 143/68 99.8 F (37.7 C) -- (!) 117 20 93 %  02/18/21 0256 (!) 167/81 100.3 F (37.9 C) Oral (!) 112 (!) 21 94 %  02/18/21 0044 (!) 159/77 100.2 F (37.9 C) Oral (!) 113 (!) 24 100 %  02/17/21 1917 (!) 158/82 97.7 F (36.5 C) -- (!) 126 18 99 %  02/17/21 1840 (!) 146/76 99.6 F (37.6 C) Oral (!) 126 20 98 %    Port removed- dresisng in place Chest b/l air entry [id number] Abd soft CNS grossly non focal  Labs CBC Latest Ref Rng & Units 02/18/2021 02/17/2021 02/16/2021  WBC 4.0 - 10.5 K/uL 9.2 9.4 11.3(H)  Hemoglobin 12.0 - 15.0 g/dL 7.6(L) 8.5(L) 8.2(L)  Hematocrit 36.0 - 46.0 % 22.9(L) 26.1(L) 25.1(L)  Platelets 150 - 400 K/uL 375 395 355    CMP Latest Ref Rng & Units 02/18/2021 02/17/2021 02/16/2021  Glucose 70 - 99 mg/dL 197(H) 165(H) 175(H)  BUN 8 - 23 mg/dL 13 10 12   Creatinine 0.44 - 1.00 mg/dL 0.63 0.64 0.59  Sodium 135 - 145 mmol/L 128(L) 131(L) 127(L)  Potassium 3.5 - 5.1 mmol/L 4.2 4.3 4.1  Chloride 98 - 111 mmol/L 94(L) 95(L) 91(L)  CO2 22 - 32 mmol/L 26 29 28   Calcium 8.9 - 10.3 mg/dL 7.9(L) 8.3(L) 8.2(L)  Total Protein 6.5 - 8.1 g/dL 6.0(L) 6.2(L) 6.0(L)  Total Bilirubin 0.3 - 1.2 mg/dL 0.5 0.4 0.6  Alkaline Phos 38 - 126 U/L 67 76 61  AST 15 - 41 U/L 35 58(H) 75(H)  ALT 0 - 44 U/L 39  58(H) 60(H)    Micro 02/14/21 BC- MSSA 02/17/21 BC MSSA 02/18/21 Cath tip  Impression/recommendation MSSA bacteremia likely due to infected port - high bioburden repeat blood culture from 1/30 was positive as well. PORT removed 02/18/21 Will repeat blood culture tomorrow Will need TEE Continue cefazolin- will need minimum 4 weeks -may be upto 6 weeks  Rt hip pain- no septic arthritis -possible obturator internus bursitis  Anemia  Hyponatremia  Hypoalbuminemia   Ca breast on chemo  Discussed the management with patient an care team

## 2021-02-18 NOTE — Consult Note (Signed)
[facility] Consultation Note  Patient ID: Tamara Mejia, MRN: [id number], DOB/AGE: 69-Aug-1952 69 y.o. Admit date: 02/14/2021   Date of Consult: 02/18/2021 Primary Physician: Kirk Ruths, MD Primary Cardiologist: None  Chief Complaint:  Chief Complaint  Patient presents with   Hip Pain   Reason for Consult:  Endocarditis  HPI: 71 y.o. female with known breast cancer status postchemotherapy and other treatment with diabetes hypertension hyperlipidemia on appropriate medication management who presented due to some significant right hip pain.  When seen in the emergency room the patient was found to have MSSA bacteremia.  This is primarily likely from her cancer port and catheter but may have some concerns for endocarditis.  Echocardiogram was performed showing normal LV systolic function ejection fraction 65% with suboptimal images for valvular evaluation of vegetations.  The patient otherwise has felt fairly well with initiation of antibiotics and has not had any sepsis type symptoms at this time.  We have had a long discussion of the risks and benefits of transesophageal echocardiogram.  This is to further evaluate the valves for endocarditis.  The patient understands the risks and benefits of this although may need further discussion with room family and/or other legal guardian representative.  The risks include the possibility esophageal perforation sore throat side effects of medications for sedation.  The patient is at low risk for moderate sedation  Past Medical History:  Diagnosis Date   Breast cancer ([place])    Cognitive dysfunction    mental retardation (mild to moderate)   Diabetes mellitus, type 2 (HCC)    diet controlled   Family history of breast cancer    High cholesterol    History of 2019 novel coronavirus disease (COVID-19) 11/04/2020   a.) resides in group home; others were sick with SARS-Cov-2. Home test (+).   Hypertension    Meningitis     in childhood   Obesity (BMI 30-39.9)    Overactive bladder    Seizure ([place])    last seizure 2000      Surgical History:  Past Surgical History:  Procedure Laterality Date   ABDOMINAL HYSTERECTOMY     as a teenager   BREAST BIOPSY Right    neg   BREAST BIOPSY Left 10/18/2020   u/s breast  biopsy, "venus" clip-INVASIVE MAMMARY CARCINOMA   CHOLECYSTECTOMY     COLONOSCOPY WITH PROPOFOL N/A 08/27/2015   Procedure: COLONOSCOPY WITH PROPOFOL;  Surgeon: Lucilla Lame, MD;  Location: [facility] ENDOSCOPY;  Service: Endoscopy;  Laterality: N/A;   PART MASTECTOMY,RADIO FREQUENCY LOCALIZER,AXILLARY SENTINEL NODE BIOPSY Left 11/22/2020   Procedure: PART MASTECTOMY, RADIO FREQUENCY LOCALIZER,AXILLARY SENTINEL NODE BIOPSY;  Surgeon: Herbert Pun, MD;  Location: [facility] ORS;  Service: General;  Laterality: Left;   PORTA CATH REMOVAL N/A 02/18/2021   Procedure: PORTA CATH REMOVAL;  Surgeon: Katha Cabal, MD;  Location: [place] [facility];  Service: Cardiovascular;  Laterality: N/A;   PORTACATH PLACEMENT N/A 12/31/2020   Procedure: INSERTION PORT-A-CATH;  Surgeon: Herbert Pun, MD;  Location: [facility] ORS;  Service: General;  Laterality: N/A;     Home Meds: Prior to Admission medications   Medication Sig Start Date End Date Taking? Authorizing Provider  acetaminophen (TYLENOL) 500 MG tablet Take 1,000 mg by mouth every 8 (eight) hours as needed (pain.).   Yes [provider]  amLODipine (NORVASC) 5 MG tablet Take 5 mg by mouth in the morning.   Yes [provider]  aspirin 81 MG EC tablet Take 81 mg by  mouth in the morning. Swallow whole.   Yes [provider]  carbamazepine (TEGRETOL) 200 MG tablet TAKE ONE TABLET BY MOUTH THREE TIMES DAILY. (SEIZURE CONTROL) 10/21/17  Yes Dennie Bible, NP  Cholecalciferol (VITAMIN D) 50 MCG (2000 UT) tablet Take 4,000 Units by mouth in the morning.   Yes [provider]  dexamethasone (DECADRON) 4 MG  tablet Take 2 tablets (8 mg total) by mouth 2 (two) times daily. Start the day before Taxotere. Then again the day after chemo for 3 days. 12/23/20  Yes Earlie Server, MD  HYDROcodone-acetaminophen [facility]) 10-325 MG tablet Take 1 tablet by mouth every 6 (six) hours as needed.   Yes [provider]  ibuprofen (ADVIL) 200 MG tablet Take 400 mg by mouth every 8 (eight) hours as needed (pain.).   Yes [provider]  lidocaine-prilocaine (EMLA) cream Apply to affected area once 12/23/20  Yes Earlie Server, MD  lisinopril-hydrochlorothiazide (ZESTORETIC) 20-12.5 MG tablet Take 1 tablet by mouth every morning. 07/08/15  Yes [provider]  loratadine (CLARITIN) 10 MG tablet Take 1 tablet (10 mg total) by mouth daily. Take Claritin daily for 4 days, Day1-4 of each chemo cycle. 01/08/21  Yes Earlie Server, MD  metFORMIN (GLUCOPHAGE) 500 MG tablet Take by mouth 2 (two) times daily with a meal. 01/10/21 01/10/22 Yes [provider]  ondansetron (ZOFRAN) 8 MG tablet Take 1 tablet (8 mg total) by mouth 2 (two) times daily as needed for refractory nausea / vomiting. Start on day 3 after chemo. 12/23/20  Yes Earlie Server, MD  prochlorperazine (COMPAZINE) 10 MG tablet Take 1 tablet (10 mg total) by mouth every 6 (six) hours as needed (Nausea or vomiting). 12/23/20  Yes Earlie Server, MD  simvastatin (ZOCOR) 20 MG tablet Take 20 mg by mouth in the morning.   Yes [provider]    Inpatient Medications:   amLODipine  5 mg Oral Q breakfast   aspirin EC  81 mg Oral Q breakfast   carbamazepine  200 mg Oral TID   Chlorhexidine Gluconate Cloth  6 each Topical Daily   enoxaparin (LOVENOX) injection  40 mg Subcutaneous Q24H   fentaNYL       insulin aspart  0-20 Units Subcutaneous TID WC   insulin aspart  0-5 Units Subcutaneous QHS   lisinopril  20 mg Oral Once   [START ON 02/19/2021] lisinopril  40 mg Oral Q breakfast   midazolam       multivitamin with minerals  1 tablet Oral Daily    Ensure Max Protein  11 oz Oral BID   simvastatin  20 mg Oral QPM   sodium chloride flush  10-40 mL Intracatheter Q12H    sodium chloride 75 mL/hr at 02/18/21 1638    ceFAZolin (ANCEF) IV 2 g (02/18/21 1538)   magnesium sulfate bolus IVPB      Allergies: No Known Allergies  Social History   Socioeconomic History   Marital status: Single    Spouse name: Not on file   Number of children: Not on file   Years of education: Not on file   Highest education level: Not on file  Occupational History   Not on file  Tobacco Use   Smoking status: Never   Smokeless tobacco: Never  Substance and Sexual Activity   Alcohol use: No   Drug use: No   Sexual activity: Not on file  Other Topics Concern   Not on file  Social History Narrative   Patient  works for [employer]   Patient has her adult education certificate   Patient drinks about 2 cups of coffee daily.          Social Determinants of Health   Financial Resource Strain: Not on file  Food Insecurity: Not on file  Transportation Needs: Not on file  Physical Activity: Not on file  Stress: Not on file  Social Connections: Not on file  Intimate Partner Violence: Not on file     Family History  Problem Relation Age of Onset   Diabetes Father    Heart disease Father    Heart disease Brother    Breast cancer Cousin        dx 59s     Review of Systems Positive for shortness of breath Negative for: General:  chills, fever, night sweats or weight changes.  Cardiovascular: PND orthopnea syncope dizziness  Dermatological skin lesions rashes Respiratory: Cough congestion Urologic: Frequent urination urination at night and hematuria Abdominal: negative for nausea, vomiting, diarrhea, bright red blood per rectum, melena, or hematemesis Neurologic: negative for visual changes, and/or hearing changes  All other systems reviewed and are otherwise negative except as noted above.  Labs: No results for  input(s): CKTOTAL, CKMB, TROPONINI in the last 72 hours. Lab Results  Component Value Date   WBC 9.2 02/18/2021   HGB 7.6 (L) 02/18/2021   HCT 22.9 (L) 02/18/2021   MCV 89.8 02/18/2021   PLT 375 02/18/2021    Recent Labs  Lab 02/18/21 0422  NA 128*  K 4.2  CL 94*  CO2 26  BUN 13  CREATININE 0.63  CALCIUM 7.9*  PROT 6.0*  BILITOT 0.5  ALKPHOS 67  ALT 39  AST 35  GLUCOSE 197*   No results found for: CHOL, HDL, LDLCALC, TRIG No results found for: DDIMER  Radiology/Studies:  DG Chest 2 View  Result Date: 02/14/2021 CLINICAL DATA:  Abnormal chest x-ray, weak, hyponatremia EXAM: CHEST - 2 VIEW COMPARISON:  02/14/2021, 12/31/2020 FINDINGS: Frontal and lateral views of the chest demonstrates stable right chest wall port. Cardiac silhouette is unremarkable. Patient is slightly rotated to the right, accentuating the vascular markings at the right lung base. No airspace disease identified on lateral view. No effusion or pneumothorax. No acute bony abnormalities. IMPRESSION: 1. No acute airspace disease. 2. Densities in the medial right lung base on prior exam correspond to prominent vascular shadows and superimposed structures on this study. Electronically Signed   By: Randa Ngo M.D.   On: 02/14/2021 18:42   MR HIP RIGHT WO CONTRAST  Result Date: 02/14/2021 CLINICAL DATA:  Hip pain for 1 week, pain with ambulation EXAM: MR OF THE RIGHT HIP WITHOUT CONTRAST TECHNIQUE: Multiplanar, multisequence MR imaging was performed. No intravenous contrast was administered. COMPARISON:  02/14/2021 FINDINGS: Bones: There are no acute or destructive bony lesions. No abnormal signal. Articular cartilage and labrum Articular cartilage:  No gross abnormalities. Labrum:  Limited visualization. Joint or bursal effusion Joint effusion:  Trace symmetrical bilateral hip effusions. Bursae: There is a small amount of fluid medial to the right ischial tuberosity near the insertion of the obturator internus  muscle, which may reflect bursitis. Muscles and tendons Muscles and tendons: Minimal edema within the insertion of the right obturator internus muscle near the ischial tuberosity. Fluid signal along the shield tuberosities may also reflect superimposed bursitis. Remaining muscular structures are unremarkable. Other findings Miscellaneous:   Visualized intrapelvic structures are unremarkable. IMPRESSION: 1. Minimal fluid signal within the insertion of the  right obturator internus muscle, as well as along the medial margin of the right ischial tuberosity. Findings consistent with muscular strain and associated obturator internus bursitis. 2. No acute or destructive bony lesions. Electronically Signed   By: Randa Ngo M.D.   On: 02/14/2021 20:25   PERIPHERAL VASCULAR CATHETERIZATION  Result Date: 02/18/2021 See surgical note for result.  DG Chest Portable 1 View  Result Date: 02/14/2021 CLINICAL DATA:  Weak and hyponatremic EXAM: PORTABLE CHEST 1 VIEW.  Patient is slightly rotated. COMPARISON:  Chest x-ray 12/31/2020. FINDINGS: Right chest wall Port-A-Cath with tip overlying the expected region of the distal superior vena cava just proximal to the superior cavoatrial junction. Poorly visualized right heart border. Otherwise the heart and mediastinal contours are unchanged. Aortic calcification. Query right middle lobe opacity. No pulmonary edema. No pleural effusion. No pneumothorax. No acute osseous abnormality. IMPRESSION: 1. Query right middle lobe opacity. Recommend repeat PA and lateral view of the chest. 2.  Aortic Atherosclerosis (ICD10-I70.0). Electronically Signed   By: Iven Finn M.D.   On: 02/14/2021 17:59   ECHOCARDIOGRAM COMPLETE  Result Date: 02/17/2021    ECHOCARDIOGRAM REPORT   Patient Name:   MALAYZIA LAFORTE Weems Date of Exam: 02/17/2021 Medical Rec #:  [id number]      Height:       61.0 in Accession #:    [id number]     Weight:       185.0 lb Date of Birth:  12/03/1950       BSA:           1.827 m Patient Age:    89 years       BP:           158/87 mmHg Patient Gender: F              HR:           113 bpm. Exam Location:  [facility] Procedure: 2D Echo, Color Doppler and Cardiac Doppler Indications:     R78.81 Bacteremia  History:         Patient has prior history of Echocardiogram examinations, most                  recent 01/01/2021. Risk Factors:Hypertension, Diabetes and HCL.  Sonographer:     Charmayne Sheer Referring Phys:  [id number] Claiborne Billings A GRIFFITH Diagnosing Phys: Kathlyn Sacramento MD  Sonographer Comments: Suboptimal apical window and no subcostal window. Image acquisition challenging due to patient body habitus. IMPRESSIONS  1. Left ventricular ejection fraction, by estimation, is 60 to 65%. The left ventricle has normal function. The left ventricle has no regional wall motion abnormalities. There is mild left ventricular hypertrophy. Left ventricular diastolic parameters are consistent with Grade I diastolic dysfunction (impaired relaxation).  2. Right ventricular systolic function is normal. The right ventricular size is normal. Tricuspid regurgitation signal is inadequate for assessing PA pressure.  3. The mitral valve is normal in structure. No evidence of mitral valve regurgitation. No evidence of mitral stenosis.  4. The aortic valve is normal in structure. Aortic valve regurgitation is not visualized. No aortic stenosis is present.  5. No clear vegetations but overall suboptimal study. FINDINGS  Left Ventricle: Left ventricular ejection fraction, by estimation, is 60 to 65%. The left ventricle has normal function. The left ventricle has no regional wall motion abnormalities. The left ventricular internal cavity size was normal in size. There is  mild left ventricular hypertrophy. Left ventricular diastolic parameters are consistent with Grade I diastolic  dysfunction (impaired relaxation). Right Ventricle: The right ventricular size is normal. No increase in right ventricular wall thickness. Right  ventricular systolic function is normal. Tricuspid regurgitation signal is inadequate for assessing PA pressure. Left Atrium: Left atrial size was normal in size. Right Atrium: Right atrial size was normal in size. Pericardium: There is no evidence of pericardial effusion. Mitral Valve: The mitral valve is normal in structure. No evidence of mitral valve regurgitation. No evidence of mitral valve stenosis. MV peak gradient, 6.7 mmHg. The mean mitral valve gradient is 4.0 mmHg. Tricuspid Valve: The tricuspid valve is normal in structure. Tricuspid valve regurgitation is not demonstrated. No evidence of tricuspid stenosis. Aortic Valve: The aortic valve is normal in structure. Aortic valve regurgitation is not visualized. No aortic stenosis is present. Aortic valve mean gradient measures 6.0 mmHg. Aortic valve peak gradient measures 11.6 mmHg. Aortic valve area, by VTI measures 1.95 cm. Pulmonic Valve: The pulmonic valve was normal in structure. Pulmonic valve regurgitation is not visualized. No evidence of pulmonic stenosis. Aorta: The aortic root is normal in size and structure. Venous: The inferior vena cava was not well visualized. IAS/Shunts: No atrial level shunt detected by color flow Doppler.  LEFT VENTRICLE PLAX 2D LVIDd:         4.30 cm   Diastology LVIDs:         2.74 cm   LV e' medial:    7.29 cm/s LV PW:         1.09 cm   LV E/e' medial:  11.6 LV IVS:        0.78 cm   LV e' lateral:   9.57 cm/s LVOT diam:     1.80 cm   LV E/e' lateral: 8.9 LV SV:         47 LV SV Index:   26 LVOT Area:     2.54 cm  RIGHT VENTRICLE RV Basal diam:  3.77 cm LEFT ATRIUM           Index LA diam:      2.90 cm 1.59 cm/m LA Vol (A4C): 37.1 ml 20.30 ml/m  AORTIC VALVE                     PULMONIC VALVE AV Area (Vmax):    1.93 cm      PV Vmax:       1.18 m/s AV Area (Vmean):   1.94 cm      PV Vmean:      78.200 cm/s AV Area (VTI):     1.95 cm      PV VTI:        0.188 m AV Vmax:           170.00 cm/s   PV Peak grad:  5.6  mmHg AV Vmean:          112.000 cm/s  PV Mean grad:  3.0 mmHg AV VTI:            0.242 m AV Peak Grad:      11.6 mmHg AV Mean Grad:      6.0 mmHg LVOT Vmax:         129.00 cm/s LVOT Vmean:        85.200 cm/s LVOT VTI:          0.185 m LVOT/AV VTI ratio: 0.76  AORTA Ao Root diam: 3.00 cm MITRAL VALVE MV Area (PHT): 6.32 cm     SHUNTS MV Area VTI:   2.27  cm     Systemic VTI:  0.18 m MV Peak grad:  6.7 mmHg     Systemic Diam: 1.80 cm MV Mean grad:  4.0 mmHg MV Vmax:       1.29 m/s MV Vmean:      88.8 cm/s MV Decel Time: 120 msec MV E velocity: 84.80 cm/s MV A velocity: 114.00 cm/s MV E/A ratio:  0.74 Kathlyn Sacramento MD Electronically signed by Kathlyn Sacramento MD Signature Date/Time: 02/17/2021/1:06:03 PM    Final    DG Hip Unilat W or Wo Pelvis 2-3 Views Right  Result Date: 02/14/2021 CLINICAL DATA:  Right hip pain EXAM: DG HIP (WITH OR WITHOUT PELVIS) 2-3V RIGHT COMPARISON:  None. FINDINGS: No recent fracture or dislocation is seen. Degenerative changes are noted with small bony spurs. Degenerative changes are noted in the pubic symphysis and visualized lower lumbar spine. IMPRESSION: No recent fracture or dislocation is seen in the right hip. Degenerative changes with small bony spurs are noted in the right hip. Electronically Signed   By: Elmer Picker M.D.   On: 02/14/2021 15:39    EKG: Normal sinus rhythm  Weights: Filed Weights   02/14/21 1430  Weight: 83.9 kg     Physical Exam: Blood pressure 140/65, pulse (!) 107, temperature 99.4 F (37.4 C), resp. rate 20, height 5\' 1"  (1.549 m), weight 83.9 kg, SpO2 92 %. Body mass index is 34.96 kg/m. General: Well developed, well nourished, in no acute distress. Head eyes ears nose throat: Normocephalic, atraumatic, sclera non-icteric, no xanthomas, nares are without discharge. No apparent thyromegaly and/or mass  Lungs: Normal respiratory effort.  no wheezes, no rales, no rhonchi.  Heart: RRR with normal S1 S2. no murmur gallop, no rub, PMI is  normal size and placement, carotid upstroke normal without bruit, jugular venous pressure is normal Abdomen: Soft, non-tender, non-distended with normoactive bowel sounds. No hepatomegaly. No rebound/guarding. No obvious abdominal masses. Abdominal aorta is normal size without bruit Extremities: No edema. no cyanosis, no clubbing, no ulcers  Peripheral : 2+ bilateral upper extremity pulses, 2+ bilateral femoral pulses, 2+ bilateral dorsal pedal pulse Neuro: Alert and oriented. No facial asymmetry. No focal deficit. Moves all extremities spontaneously. Musculoskeletal: Normal muscle tone without kyphosis Psych:  Responds to questions appropriately with a normal affect.    Assessment: 71 year old female with hypertension hyperlipidemia diabetes and cancer treatment with MSSA bacteremia concerning for the possibility of endocarditis status post catheter removal and feeling well at this time needing transesophageal echocardiogram for further evaluation and treatment options and the possibility of endocarditis  Plan: 1.  Continue supportive care of MSSA bacteremia with antibiotics since hydration 2.  Proceed to transesophageal echocardiogram for assessment of possible endocarditis in conjunction with MSSA bacteremia.  Patient understands risk and benefits as listed above  Signed, Corey Skains M.D. [facility] 02/18/2021, 5:11 PM

## 2021-02-18 NOTE — Progress Notes (Addendum)
Pt reported she spoke with the guardian tonight and they do not want her to go to rehab or have TEE. Orders indicate 03/19/2021. Orders not released due to date of 03/19/21

## 2021-02-19 ENCOUNTER — Encounter
Admission: EM | Disposition: A | Discharge status: Skilled Nursing Facility | Payer: Self-pay | Source: Home / Self Care | Attending: Internal Medicine

## 2021-02-19 ENCOUNTER — Inpatient Hospital Stay: Payer: Medicare Other

## 2021-02-19 ENCOUNTER — Inpatient Hospital Stay
Admit: 2021-02-19 | Discharge: 2021-02-19 | Disposition: A | Discharge status: Home / Self Care | Payer: Medicare Other | Attending: Internal Medicine | Admitting: Internal Medicine

## 2021-02-19 ENCOUNTER — Inpatient Hospital Stay: Payer: Medicare Other | Admitting: Anesthesiology

## 2021-02-19 ENCOUNTER — Inpatient Hospital Stay: Payer: Medicare Other | Admitting: Oncology

## 2021-02-19 DIAGNOSIS — Z6835 Body mass index (BMI) 35.0-35.9, adult: Secondary | ICD-10-CM

## 2021-02-19 DIAGNOSIS — M7071 Other bursitis of hip, right hip: Secondary | ICD-10-CM | POA: Insufficient documentation

## 2021-02-19 DIAGNOSIS — R7881 Bacteremia: Secondary | ICD-10-CM | POA: Diagnosis not present

## 2021-02-19 DIAGNOSIS — E1169 Type 2 diabetes mellitus with other specified complication: Secondary | ICD-10-CM

## 2021-02-19 DIAGNOSIS — B9561 Methicillin susceptible Staphylococcus aureus infection as the cause of diseases classified elsewhere: Secondary | ICD-10-CM | POA: Diagnosis not present

## 2021-02-19 DIAGNOSIS — A4101 Sepsis due to Methicillin susceptible Staphylococcus aureus: Secondary | ICD-10-CM

## 2021-02-19 HISTORY — PX: TEE WITHOUT CARDIOVERSION: SHX5443

## 2021-02-19 LAB — CBC WITH DIFFERENTIAL/PLATELET
Abs Immature Granulocytes: 0.08 K/uL — ABNORMAL HIGH (ref 0.00–0.07)
Basophils Absolute: 0.1 K/uL (ref 0.0–0.1)
Basophils Relative: 1 %
Eosinophils Absolute: 0.1 K/uL (ref 0.0–0.5)
Eosinophils Relative: 2 %
HCT: 24.6 % — ABNORMAL LOW (ref 36.0–46.0)
Hemoglobin: 8 g/dL — ABNORMAL LOW (ref 12.0–15.0)
Immature Granulocytes: 1 %
Lymphocytes Relative: 12 %
Lymphs Abs: 1 K/uL (ref 0.7–4.0)
MCH: 30.2 pg (ref 26.0–34.0)
MCHC: 32.5 g/dL (ref 30.0–36.0)
MCV: 92.8 fL (ref 80.0–100.0)
Monocytes Absolute: 0.6 K/uL (ref 0.1–1.0)
Monocytes Relative: 7 %
Neutro Abs: 5.9 K/uL (ref 1.7–7.7)
Neutrophils Relative %: 77 %
Platelets: 390 K/uL (ref 150–400)
RBC: 2.65 MIL/uL — ABNORMAL LOW (ref 3.87–5.11)
RDW: 14.4 % (ref 11.5–15.5)
WBC: 7.7 K/uL (ref 4.0–10.5)
nRBC: 0 % (ref 0.0–0.2)

## 2021-02-19 LAB — BLOOD CULTURE ID PANEL (REFLEXED) - BCID2

## 2021-02-19 LAB — LACTATE DEHYDROGENASE: LDH: 162 U/L (ref 98–192)

## 2021-02-19 LAB — COMPREHENSIVE METABOLIC PANEL WITH GFR
ALT: 68 U/L — ABNORMAL HIGH (ref 0–44)
AST: 89 U/L — ABNORMAL HIGH (ref 15–41)
Albumin: 2 g/dL — ABNORMAL LOW (ref 3.5–5.0)
Alkaline Phosphatase: 63 U/L (ref 38–126)
Anion gap: 7 (ref 5–15)
BUN: 14 mg/dL (ref 8–23)
CO2: 27 mmol/L (ref 22–32)
Calcium: 7.9 mg/dL — ABNORMAL LOW (ref 8.9–10.3)
Chloride: 96 mmol/L — ABNORMAL LOW (ref 98–111)
Creatinine, Ser: 0.51 mg/dL (ref 0.44–1.00)
GFR, Estimated: >60 mL/min (ref >60)
Glucose, Bld: 158 mg/dL — ABNORMAL HIGH (ref 70–99)
Potassium: 4.3 mmol/L (ref 3.5–5.1)
Sodium: 130 mmol/L — ABNORMAL LOW (ref 135–145)
Total Bilirubin: 0.2 mg/dL — ABNORMAL LOW (ref 0.3–1.2)
Total Protein: 6.5 g/dL (ref 6.5–8.1)

## 2021-02-19 LAB — GLUCOSE, CAPILLARY
Glucose-Capillary: 171 mg/dL — ABNORMAL HIGH (ref 70–99)
Glucose-Capillary: 122 mg/dL — ABNORMAL HIGH (ref 70–99)
Glucose-Capillary: 118 mg/dL — ABNORMAL HIGH (ref 70–99)
Glucose-Capillary: 179 mg/dL — ABNORMAL HIGH (ref 70–99)
Glucose-Capillary: 127 mg/dL — ABNORMAL HIGH (ref 70–99)

## 2021-02-19 LAB — VITAMIN B12: Vitamin B-12: 2671 pg/mL — ABNORMAL HIGH (ref 180–914)

## 2021-02-19 LAB — LEGIONELLA PNEUMOPHILA SEROGP 1 UR AG: L. pneumophila Serogp 1 Ur Ag: NEGATIVE

## 2021-02-19 SURGERY — ECHOCARDIOGRAM, TRANSESOPHAGEAL
Anesthesia: Moderate Sedation

## 2021-02-19 SURGERY — ECHOCARDIOGRAM, TRANSESOPHAGEAL
Anesthesia: General

## 2021-02-19 MED ORDER — SODIUM CHLORIDE 0.9 % IV SOLN: INTRAVENOUS | Status: DC

## 2021-02-19 MED ORDER — PROPOFOL 10 MG/ML IV BOLUS
INTRAVENOUS | Status: DC | PRN
  Administered 2021-02-19: 40 mg via INTRAVENOUS
  Administered 2021-02-19: 20 mg via INTRAVENOUS

## 2021-02-19 MED ORDER — LIDOCAINE VISCOUS HCL 2 % MT SOLN
OROMUCOSAL | Status: AC
  Filled 2021-02-19: qty 15

## 2021-02-19 MED ORDER — PROPOFOL 500 MG/50ML IV EMUL
INTRAVENOUS | Status: AC
  Filled 2021-02-19: qty 50

## 2021-02-19 MED ORDER — MAGNESIUM OXIDE -MG SUPPLEMENT 400 (240 MG) MG PO TABS
400.0000 mg | ORAL_TABLET | Freq: Every day | ORAL | Status: DC
  Administered 2021-02-19 – 2021-02-28 (×10): 400 mg via ORAL
  Filled 2021-02-19 (×10): qty 1

## 2021-02-19 MED ORDER — BUTAMBEN-TETRACAINE-BENZOCAINE 2-2-14 % EX AERO
INHALATION_SPRAY | CUTANEOUS | Status: AC
  Filled 2021-02-19: qty 5

## 2021-02-19 MED ORDER — SODIUM CHLORIDE FLUSH 0.9 % IV SOLN
INTRAVENOUS | Status: AC
  Filled 2021-02-19: qty 10

## 2021-02-19 NOTE — Progress Notes (Signed)
After incentive pt sats increased to 97 % Room air. Pt reports pain decreasing pain level #6  02/19/21 0417 02/19/21 0421 02/19/21 0444  Assess: MEWS Score  Temp 99.9 F (37.7 C)  --  98.7 F (37.1 C)  BP (!) 152/83  --  (!) 149/76  Pulse Rate (!) 112  --  (!) 110  Resp 20  --  18  SpO2 95 %  --  92 %  O2 Device Room Air  --  Room Air  Assess: MEWS Score  MEWS Temp 0  --  0  MEWS Systolic 0  --  0  MEWS Pulse 2  --  1  MEWS RR 0  --  0  MEWS LOC 0  --  0  MEWS Score 2  --  1  MEWS Score Color Yellow  --  Green  Assess: if the MEWS score is Yellow or Red  Were vital signs taken at a resting state? Yes Yes  --   Focused Assessment No change from prior assessment No change from prior assessment  --   Does the patient meet 2 or more of the SIRS criteria? No No  --   Does the patient have a confirmed or suspected source of infection? Yes Yes (bactremia Port taken out 02/18/2021 and sent for testing)  --   Provider and Rapid Response Notified? No No  --   MEWS guidelines implemented *See Row Information* No, vital signs rechecked No, vital signs rechecked  --   Treat  MEWS Interventions Administered prn meds/treatments (Tylenol, oxycodone and incentive spirometer)  --   --   Pain Scale  --  0-10  --   Pain Score  --  10  --   Pain Type  --  Acute pain  --   Pain Location  --  Leg  --   Pain Orientation  --  Left  --   Pain Intervention(s)  --  Medication (See eMAR)  --   Escalate  MEWS: Escalate Yellow: discuss with charge nurse/RN and consider discussing with provider and RRT  --   --   Notify: Charge Nurse/RN  Name of Charge Nurse/RN Notified Systems developer  --   --   Date Charge Nurse/RN Notified 02/19/21  --   --   Time Charge Nurse/RN Notified [phone]  --   --   Document  Patient Outcome Stabilized after interventions  --   --   Progress note created (see row info) Yes  --   --   Assess: SIRS CRITERIA  SIRS Temperature  0  --  0  SIRS Pulse 1  --  1  SIRS Respirations  0  --  0   SIRS WBC 0  --  0  SIRS Score Sum  1  --  1

## 2021-02-19 NOTE — Progress Notes (Signed)
ID Pt doing much better No fever or chills Port was removed yesterday Appetite good No chest pain   O/e awake and alert No discomfort  Patient Vitals for the past 24 hrs:  BP Temp Temp src Pulse Resp SpO2  02/19/21 1338 (!) 149/78 98.6 F (37 C) -- 94 16 99 %  02/19/21 1300 (!) 152/68 -- -- 94 19 --  02/19/21 1245 (!) 142/70 -- -- 97 (!) 25 --  02/19/21 1230 137/67 -- -- 95 (!) 24 --  02/19/21 1229 -- -- -- 95 (!) 22 --  02/19/21 1228 -- -- -- 94 (!) 24 --  02/19/21 1227 (!) 134/110 -- -- -- (!) 26 --  02/19/21 1226 124/65 -- -- 94 (!) 25 --  02/19/21 1225 -- -- -- 96 17 --  02/19/21 1224 -- -- -- 96 (!) 21 --  02/19/21 1223 -- -- -- (!) 102 (!) 28 --  02/19/21 1222 (!) 149/63 -- -- (!) 104 (!) 25 --  02/19/21 1221 -- -- -- (!) 112 (!) 27 --  02/19/21 1220 -- -- -- -- (!) 29 --  02/19/21 1219 -- -- -- 91 (!) 27 --  02/19/21 1218 103/89 -- -- 92 (!) 22 --  02/19/21 1217 -- -- -- 93 17 --  02/19/21 1216 -- -- -- 95 (!) 25 --  02/19/21 1151 (!) 158/74 -- -- -- -- --  02/19/21 1148 -- 98.2 F (36.8 C) Oral (!) 101 (!) 23 96 %  02/19/21 1130 131/69 98.6 F (37 C) Oral 96 20 98 %  02/19/21 0901 128/69 98.1 F (36.7 C) -- 98 16 98 %  02/19/21 0806 124/72 98.2 F (36.8 C) Oral (!) 102 18 100 %  02/19/21 0542 -- -- -- 98 -- --  02/19/21 0446 -- -- -- -- -- 97 %  02/19/21 0444 (!) 149/76 98.7 F (37.1 C) -- (!) 110 18 92 %  02/19/21 0417 (!) 152/83 99.9 F (37.7 C) -- (!) 112 20 95 %  02/19/21 0412 (!) 152/83 -- -- (!) 113 -- 100 %  02/19/21 0010 (!) 160/99 98.3 F (36.8 C) -- (!) 110 20 100 %  02/18/21 2110 (!) 146/75 98.4 F (36.9 C) -- 98 16 99 %    Port removed- dressing  in place Chest b/l air entry [id number] Abd soft CNS grossly non focal  Labs CBC Latest Ref Rng & Units 02/19/2021 02/18/2021 02/17/2021  WBC 4.0 - 10.5 K/uL 7.7 9.2 9.4  Hemoglobin 12.0 - 15.0 g/dL 8.0(L) 7.6(L) 8.5(L)  Hematocrit 36.0 - 46.0 % 24.6(L) 22.9(L) 26.1(L)  Platelets 150 - 400 K/uL 390  375 395    CMP Latest Ref Rng & Units 02/19/2021 02/18/2021 02/17/2021  Glucose 70 - 99 mg/dL 158(H) 197(H) 165(H)  BUN 8 - 23 mg/dL 14 13 10   Creatinine 0.44 - 1.00 mg/dL 0.51 0.63 0.64  Sodium 135 - 145 mmol/L 130(L) 128(L) 131(L)  Potassium 3.5 - 5.1 mmol/L 4.3 4.2 4.3  Chloride 98 - 111 mmol/L 96(L) 94(L) 95(L)  CO2 22 - 32 mmol/L 27 26 29   Calcium 8.9 - 10.3 mg/dL 7.9(L) 7.9(L) 8.3(L)  Total Protein 6.5 - 8.1 g/dL 6.5 6.0(L) 6.2(L)  Total Bilirubin 0.3 - 1.2 mg/dL 0.2(L) 0.5 0.4  Alkaline Phos 38 - 126 U/L 63 67 76  AST 15 - 41 U/L 89(H) 35 58(H)  ALT 0 - 44 U/L 68(H) 39 58(H)    Micro 02/14/21 BC- MSSA 02/17/21 BC MSSA 02/18/21 Cath tip  Impression/recommendation  MSSA bacteremia likely due to infected port - high bioburden repeat blood culture from 1/30 was positive as well. PORT removed 02/18/21 Will repeat blood culture tomorrow Will need TEE Continue cefazolin- will need minimum 4 weeks -may be upto 6 weeks  Rt hip pain- no septic arthritis -possible obturator internus bursitis  Anemia  Hyponatremia  Hypoalbuminemia   Ca breast on chemo  Discussed the management with patient and care team

## 2021-02-19 NOTE — Progress Notes (Signed)
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  Imaging of the hip including MRI was consistent with muscle strain and likely bursitis.    Patient was started on empiric antibiotics on admission due to leukocytosis, tachycardia and concern for possible UTI and/or pneumonia.  Blood cultures subsequently positive for MSSA.    Admitted with sepsis due to MSSA bacteremia.  On IV antibiotics with further evaluation underway.  Assessment and Plan: * MSSA bacteremia- (present on admission) Source likely her port.  Port removed on 02/19/2020.  Will likely need 4 weeks of antibiotics.  TEE today negative.  Continue Ancef.  Appreciate infectious disease consultation.  Will need PICC line.  Consented guardian for PICC line.  Sepsis ([place])- (present on admission) Present on admission with MSSA bacteremia, leukocytosis and fever.  right Hip pain- (present on admission) Suspected bursitis.  Antibiotics would cover if infection.  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 Legal Guardian is Geraldo Pitter [phone].   Type 2 diabetes mellitus with hyperlipidemia (HCC) Last hemoglobin A1c 7.3.  Currently on sliding scale insulin.  Continue simvastatin.  Acute anemia Last hemoglobin 8.0.  Suspect this is secondary to chemotherapy.  Peripheral smear shows rouleaux formation which likely goes along with infection.  Iron studies  consistent with anemia of chronic disease.  Abnormal urinalysis- (present on admission) Pt denies dysuria, frequency or other GU symptoms.  On antibiotics for MSSA bacteremia.  Hyponatremia- (present on admission) Today sodium up from 128 to 130  Malignant neoplasm of left breast in female, estrogen receptor positive (HCC) Stable.  Follows with Dr. Tasia Catchings.  Currently on chemotherapy which is now held until IV antibiotic course is complete and infection resolved.   Dr. Tasia Catchings notified of this admission. Port removed today 1/31.  Hypomagnesemia- (present on admission) Continue replacement orally.   Hypocalcemia- (present on admission) Ruled out.  Corrected calcium is within normal limits.  Hypokalemia- (present on admission) Resolved with replacement. Monitor K and replace as needed. 1/31: K 4.1  Severe obesity (BMI 35.0-35.9 with comorbidity) (HCC) BMI 34.96   Seizure (HCC) Continue Tegretol.    HTN (hypertension), benign- (present on admission) Continue current blood pressure regimen.  Last blood pressure 149/78        Subjective: Patient feels okay.  Had port removed yesterday.  Initially did not want her TEE to be done.  The guardian was contacted and agreeable to TEE.  Initially admitted with sepsis and MSSA bacteremia.  Physical Exam: Vitals:   02/19/21 1230 02/19/21 1245 02/19/21 1300 02/19/21 1338  BP: 137/67 (!) 142/70 (!) 152/68 (!) 149/78  Pulse: 95 97 94 94  Resp: (!) 24 (!) 25 19 16   Temp:    98.6 F (37 C)  TempSrc:      SpO2:    99%  Weight:      Height:       Physical Exam HENT:     Head: Normocephalic.     Mouth/Throat:     Pharynx: No  oropharyngeal exudate.  Eyes:     General: Lids are normal.     Conjunctiva/sclera: Conjunctivae normal.  Cardiovascular:     Rate and Rhythm: Normal rate and regular rhythm.     Heart sounds: Normal heart sounds, S1 normal and S2 normal.  Pulmonary:     Breath sounds: No decreased breath sounds, wheezing,  rhonchi or rales.  Abdominal:     Palpations: Abdomen is soft.     Tenderness: There is no abdominal tenderness.  Musculoskeletal:     Right ankle: Swelling present.     Left ankle: Swelling present.  Skin:    General: Skin is warm.     Findings: No rash.  Neurological:     Mental Status: She is alert.     Comments: Answer some simple questions appropriately     Data Reviewed: Laboratory and radiological data from his hospital course reviewed.  Notes from specialist reviewed.  Case discussed with infectious disease specialist.  Family Communication: Spoke with guardian on the phone  Disposition: Status is: Inpatient Remains inpatient appropriate because: Being treated for MSSA bacteremia.  Will need PICC line and a place to go to administer antibiotics prior to disposition.   Planned Discharge Destination: Skilled nursing facility transitional care team will likely call over to the group home to see if they can do IV antibiotics upon disposition.   Author: Loletha Grayer, MD 02/19/2021 3:37 PM  For on call review [email or web address].

## 2021-02-19 NOTE — Progress Notes (Signed)
°   02/17/21 0750  Assess: MEWS Score  Temp (!) 100.6 F (38.1 C)  BP (!) 168/82  Pulse Rate (!) 114  Resp 16  SpO2 90 %  O2 Device Room Air  Assess: MEWS Score  MEWS Temp 1  MEWS Systolic 0  MEWS Pulse 2  MEWS RR 0  MEWS LOC 0  MEWS Score 3  MEWS Score Color Yellow  Assess: if the MEWS score is Yellow or Red  Were vital signs taken at a resting state? Yes  Focused Assessment No change from prior assessment  Does the patient meet 2 or more of the SIRS criteria? No  Does the patient have a confirmed or suspected source of infection? Yes  Provider and Rapid Response Notified? No  MEWS guidelines implemented *See Row Information* Yes  Take Vital Signs  Increase Vital Sign Frequency  Yellow: Q 2hr X 2 then Q 4hr X 2, if remains yellow, continue Q 4hrs  Escalate  MEWS: Escalate Yellow: discuss with charge nurse/RN and consider discussing with provider and RRT  Notify: Charge Nurse/RN  Name of Charge Nurse/RN Notified Community education officer  Date Charge Nurse/RN Notified 02/17/21  Time Charge Nurse/RN Notified [phone]  Document  Patient Outcome Stabilized after interventions  Progress note created (see row info) Yes  Assess: SIRS CRITERIA  SIRS Temperature  0  SIRS Pulse 1  SIRS Respirations  0  SIRS WBC 0  SIRS Score Sum  1

## 2021-02-19 NOTE — Anesthesia Preprocedure Evaluation (Signed)
Anesthesia Evaluation  Patient identified by MRN, date of birth, ID band Patient awake  General Assessment Comment:Mental retardation mild to moderate  Reviewed: Allergy & Precautions, NPO status , Patient's Chart, lab work & pertinent test results  History of Anesthesia Complications Negative for: history of anesthetic complications  Airway Mallampati: II  TM Distance: <3 FB Neck ROM: Full    Dental  (+) Poor Dentition, Implants, Dental Advidsory Given   Pulmonary neg shortness of breath, neg sleep apnea, neg COPD, neg recent URI,    breath sounds clear to auscultation- rhonchi (-) wheezing      Cardiovascular Exercise Tolerance: Good hypertension, Pt. on medications (-) angina(-) CAD, (-) Past MI, (-) Cardiac Stents and (-) CABG (-) dysrhythmias  Rhythm:Regular Rate:Normal - Systolic murmurs and - Diastolic murmurs TTE 1/88/41: 1. Left ventricular ejection fraction, by estimation, is 60 to 65%. The  left ventricle has normal function. The left ventricle has no regional  wall motion abnormalities. There is mild left ventricular hypertrophy.  Left ventricular diastolic parameters  are consistent with Grade I diastolic dysfunction (impaired relaxation).  2. Right ventricular systolic function is normal. The right ventricular  size is normal. Tricuspid regurgitation signal is inadequate for assessing  PA pressure.  3. The mitral valve is normal in structure. No evidence of mitral valve  regurgitation. No evidence of mitral stenosis.  4. The aortic valve is normal in structure. Aortic valve regurgitation is  not visualized. No aortic stenosis is present.  5. No clear vegetations but overall suboptimal study.    Neuro/Psych Seizures -, Well Controlled,  negative psych ROS   GI/Hepatic negative GI ROS, Neg liver ROS,   Endo/Other  diabetes, Oral Hypoglycemic Agents  Renal/GU negative Renal ROS      Musculoskeletal negative musculoskeletal ROS (+)   Abdominal (+) + obese,   Peds  Hematology  (+) Blood dyscrasia, anemia ,   Anesthesia Other Findings Pt is admitted due to hip pain and sepsis from bacteremia. Sepsis has resolved. She has a port for chemotherapy for breast cancer. There is concern for endocarditis.   Hyponatremia Hypoalbuminemia  Past Medical History: No date: Breast cancer ([place]) No date: Cognitive dysfunction     Comment:  mental retardation (mild to moderate) No date: Diabetes mellitus, type 2 (HCC)     Comment:  diet controlled No date: High cholesterol 11/04/2020: History of 2019 novel coronavirus disease (COVID-19)     Comment:  a.) resides in group home; others were sick with               SARS-Cov-2. Home test (+). No date: Hypertension No date: Meningitis     Comment:  in childhood No date: Obesity (BMI 30-39.9) No date: Overactive bladder No date: Seizure [facility])     Comment:  last seizure 2000   Reproductive/Obstetrics                             Anesthesia Physical  Anesthesia Plan  ASA: 3  Anesthesia Plan: General   Post-op Pain Management:    Induction: Intravenous  PONV Risk Score and Plan: 2 and Propofol infusion and TIVA  Airway Management Planned: Natural Airway, Simple Face Mask and Nasal Cannula  Additional Equipment:   Intra-op Plan:   Post-operative Plan:   Informed Consent: I have reviewed the patients History and Physical, chart, labs and discussed the procedure including the risks, benefits and alternatives for the proposed anesthesia with the patient  or authorized representative who has indicated his/her understanding and acceptance.     Dental advisory given and Consent reviewed with POA  Plan Discussed with: CRNA and Anesthesiologist  Anesthesia Plan Comments:         Anesthesia Quick Evaluation

## 2021-02-19 NOTE — Progress Notes (Signed)
Patient ID: Tamara Mejia, female   DOB: 1950-04-14, 71 y.o.   MRN: [id number]  Subjective: The patient notes further improvement in her right posterior hip symptoms .  She feels that she is able to ambulate more comfortably and to get around in her room with her walker more easily.  She has no new complaints regarding her right hip or leg.  Objective: Vital signs in last 24 hours: Temp:  [98.1 F (36.7 C)-99.9 F (37.7 C)] 98.1 F (36.7 C) (02/01 0901) Pulse Rate:  [98-113] 98 (02/01 0901) Resp:  [16-20] 16 (02/01 0901) BP: (124-160)/(62-99) 128/69 (02/01 0901) SpO2:  [92 %-100 %] 98 % (02/01 0901)  Intake/Output from previous day: 01/31 0701 - 02/01 0700 In: 1131 [P.O.:220; I.V.:661; IV Piggyback:250] Out: 1200 [Urine:1200] Intake/Output this shift: No intake/output data recorded.  Recent Labs    02/17/21 0524 02/18/21 0422 02/19/21 0354  HGB 8.5* 7.6* 8.0*   Recent Labs    02/18/21 0422 02/19/21 0354  WBC 9.2 7.7  RBC 2.55*   2.52* 2.65*  HCT 22.9* 24.6*  PLT 375 390   Recent Labs    02/18/21 0422 02/19/21 0354  NA 128* 130*  K 4.2 4.3  CL 94* 96*  CO2 26 27  BUN 13 14  CREATININE 0.63 0.51  GLUCOSE 197* 158*  CALCIUM 7.9* 7.9*   No results for input(s): LABPT, INR in the last 72 hours.  Physical Exam: Orthopedic examination again is limited to the right hip and lower extremity.  She again exhibits mild discomfort to palpation over the right ischial tuberosity region, but there are no other areas of tenderness around the hip region.  Skin inspection of the right hip is unremarkable.  No swelling, erythema, ecchymosis, abrasions, or other skin abnormalities are identified.  She is able to flex and extend her hip with only mild reproduction of her right ischial tuberosity pain.  She has no hip pain with logrolling.  She is neurovascularly intact to the right lower extremity and foot.  Assessment: Posterior right hip pain secondary to probable muscle strain  with probable obturator internus bursitis by MRI scan.  Plan: The patient's right hip symptoms appear to be improving.  She may continue to be mobilized with physical therapy.  IV antibiotics can be continued as deemed appropriate medically for her septic symptoms, although the port was removed yesterday.  Thank you for asking to participate in the care of this delightful woman.  I will sign off at this time.  She may follow-up in my office on an as necessary basis.  If you have further need of orthopedic input, please reconsult me.   Marshall Cork Tatjana Turcott 02/19/2021, 9:34 AM

## 2021-02-19 NOTE — Progress Notes (Signed)
[facility] Encounter Note  Patient: Tamara Mejia / Admit Date: 02/14/2021 / Date of Encounter: 02/19/2021, 12:28 PM   Subjective: Patient overall doing well today with no evidence of significant symptoms.  Yesterday the patient tolerated extraction of catheter which is likely the primary source of her MSSA bacteremia  Transesophageal echocardiogram showing normal LV systolic function with ejection fraction of 65% with mild mitral regurgitation trivial tricuspid regurgitation and no evidence of vegetations on any valves.  Review of Systems: Positive for: None Negative for: Vision change, hearing change, syncope, dizziness, nausea, vomiting,diarrhea, bloody stool, stomach pain, cough, congestion, diaphoresis, urinary frequency, urinary pain,skin lesions, skin rashes Others previously listed  Objective: Telemetry: Normal sinus rhythm Physical Exam: Blood pressure (!) 158/74, pulse (!) 101, temperature 98.2 F (36.8 C), temperature source Oral, resp. rate (!) 23, height 5\' 1"  (1.549 m), weight 83.9 kg, SpO2 96 %. Body mass index is 34.96 kg/m. General: Well developed, well nourished, in no acute distress. Head: Normocephalic, atraumatic, sclera non-icteric, no xanthomas, nares are without discharge. Neck: No apparent masses Lungs: Normal respirations with no wheezes, no rhonchi, no rales , no crackles   Heart: Regular rate and rhythm, normal S1 S2, no murmur, no rub, no gallop, PMI is normal size and placement, carotid upstroke normal without bruit, jugular venous pressure normal Abdomen: Soft, non-tender, non-distended with normoactive bowel sounds. No hepatosplenomegaly. Abdominal aorta is normal size without bruit Extremities: No edema, no clubbing, no cyanosis, no ulcers,  Peripheral: 2+ radial, 2+ femoral, 2+ dorsal pedal pulses Neuro: Alert and oriented. Moves all extremities spontaneously. Psych:  Responds to questions appropriately with a normal  affect.   Intake/Output Summary (Last 24 hours) at 02/19/2021 1228 Last data filed at 02/19/2021 0400 Gross per 24 hour  Intake 1011.02 ml  Output 1200 ml  Net -188.98 ml    Inpatient Medications:   [MAR Hold] amLODipine  5 mg Oral Q breakfast   [MAR Hold] aspirin EC  81 mg Oral Q breakfast   butamben-tetracaine-benzocaine       [MAR Hold] carbamazepine  200 mg Oral TID   [MAR Hold] Chlorhexidine Gluconate Cloth  6 each Topical Daily   [MAR Hold] enoxaparin (LOVENOX) injection  40 mg Subcutaneous Q24H   [MAR Hold] insulin aspart  0-20 Units Subcutaneous TID WC   [MAR Hold] insulin aspart  0-5 Units Subcutaneous QHS   lidocaine       [MAR Hold] lisinopril  40 mg Oral Q breakfast   [MAR Hold] multivitamin with minerals  1 tablet Oral Daily   [MAR Hold] Ensure Max Protein  11 oz Oral BID   [MAR Hold] simvastatin  20 mg Oral QPM   [MAR Hold] sodium chloride flush  10-40 mL Intracatheter Q12H   sodium chloride flush       Infusions:   sodium chloride     [MAR Hold]  ceFAZolin (ANCEF) IV 2 g (02/19/21 0605)    Labs: Recent Labs    02/17/21 0524 02/18/21 0422 02/19/21 0354  NA 131* 128* 130*  K 4.3 4.2 4.3  CL 95* 94* 96*  CO2 29 26 27   GLUCOSE 165* 197* 158*  BUN 10 13 14   CREATININE 0.64 0.63 0.51  CALCIUM 8.3* 7.9* 7.9*  MG 1.5* 1.6*  --    Recent Labs    02/18/21 0422 02/19/21 0354  AST 35 89*  ALT 39 68*  ALKPHOS 67 63  BILITOT 0.5 0.2*  PROT 6.0* 6.5  ALBUMIN 1.9* 2.0*  Recent Labs    02/17/21 0524 02/18/21 0422 02/19/21 0354  WBC 9.4 9.2 7.7  NEUTROABS 7.6  --  5.9  HGB 8.5* 7.6* 8.0*  HCT 26.1* 22.9* 24.6*  MCV 93.2 89.8 92.8  PLT 395 375 390   No results for input(s): CKTOTAL, CKMB, TROPONINI in the last 72 hours. Invalid input(s): POCBNP No results for input(s): HGBA1C in the last 72 hours.   Weights: Filed Weights   02/14/21 1430  Weight: 83.9 kg     Radiology/Studies:  DG Chest 2 View  Result Date: 02/14/2021 CLINICAL DATA:   Abnormal chest x-ray, weak, hyponatremia EXAM: CHEST - 2 VIEW COMPARISON:  02/14/2021, 12/31/2020 FINDINGS: Frontal and lateral views of the chest demonstrates stable right chest wall port. Cardiac silhouette is unremarkable. Patient is slightly rotated to the right, accentuating the vascular markings at the right lung base. No airspace disease identified on lateral view. No effusion or pneumothorax. No acute bony abnormalities. IMPRESSION: 1. No acute airspace disease. 2. Densities in the medial right lung base on prior exam correspond to prominent vascular shadows and superimposed structures on this study. Electronically Signed   By: Randa Ngo M.D.   On: 02/14/2021 18:42   MR HIP RIGHT WO CONTRAST  Result Date: 02/14/2021 CLINICAL DATA:  Hip pain for 1 week, pain with ambulation EXAM: MR OF THE RIGHT HIP WITHOUT CONTRAST TECHNIQUE: Multiplanar, multisequence MR imaging was performed. No intravenous contrast was administered. COMPARISON:  02/14/2021 FINDINGS: Bones: There are no acute or destructive bony lesions. No abnormal signal. Articular cartilage and labrum Articular cartilage:  No gross abnormalities. Labrum:  Limited visualization. Joint or bursal effusion Joint effusion:  Trace symmetrical bilateral hip effusions. Bursae: There is a small amount of fluid medial to the right ischial tuberosity near the insertion of the obturator internus muscle, which may reflect bursitis. Muscles and tendons Muscles and tendons: Minimal edema within the insertion of the right obturator internus muscle near the ischial tuberosity. Fluid signal along the shield tuberosities may also reflect superimposed bursitis. Remaining muscular structures are unremarkable. Other findings Miscellaneous:   Visualized intrapelvic structures are unremarkable. IMPRESSION: 1. Minimal fluid signal within the insertion of the right obturator internus muscle, as well as along the medial margin of the right ischial tuberosity. Findings  consistent with muscular strain and associated obturator internus bursitis. 2. No acute or destructive bony lesions. Electronically Signed   By: Randa Ngo M.D.   On: 02/14/2021 20:25   PERIPHERAL VASCULAR CATHETERIZATION  Result Date: 02/18/2021 See surgical note for result.  DG Chest Portable 1 View  Result Date: 02/14/2021 CLINICAL DATA:  Weak and hyponatremic EXAM: PORTABLE CHEST 1 VIEW.  Patient is slightly rotated. COMPARISON:  Chest x-ray 12/31/2020. FINDINGS: Right chest wall Port-A-Cath with tip overlying the expected region of the distal superior vena cava just proximal to the superior cavoatrial junction. Poorly visualized right heart border. Otherwise the heart and mediastinal contours are unchanged. Aortic calcification. Query right middle lobe opacity. No pulmonary edema. No pleural effusion. No pneumothorax. No acute osseous abnormality. IMPRESSION: 1. Query right middle lobe opacity. Recommend repeat PA and lateral view of the chest. 2.  Aortic Atherosclerosis (ICD10-I70.0). Electronically Signed   By: Iven Finn M.D.   On: 02/14/2021 17:59   ECHOCARDIOGRAM COMPLETE  Result Date: 02/17/2021    ECHOCARDIOGRAM REPORT   Patient Name:   Tamara Mejia Date of Exam: 02/17/2021 Medical Rec #:  [id number]      Height:  61.0 in Accession #:    [id number]     Weight:       185.0 lb Date of Birth:  28-May-1950       BSA:          1.827 m Patient Age:    27 years       BP:           158/87 mmHg Patient Gender: F              HR:           113 bpm. Exam Location:  [facility] Procedure: 2D Echo, Color Doppler and Cardiac Doppler Indications:     R78.81 Bacteremia  History:         Patient has prior history of Echocardiogram examinations, most                  recent 01/01/2021. Risk Factors:Hypertension, Diabetes and HCL.  Sonographer:     Charmayne Sheer Referring Phys:  [id number] Claiborne Billings A GRIFFITH Diagnosing Phys: Kathlyn Sacramento MD  Sonographer Comments: Suboptimal apical window and no subcostal window.  Image acquisition challenging due to patient body habitus. IMPRESSIONS  1. Left ventricular ejection fraction, by estimation, is 60 to 65%. The left ventricle has normal function. The left ventricle has no regional wall motion abnormalities. There is mild left ventricular hypertrophy. Left ventricular diastolic parameters are consistent with Grade I diastolic dysfunction (impaired relaxation).  2. Right ventricular systolic function is normal. The right ventricular size is normal. Tricuspid regurgitation signal is inadequate for assessing PA pressure.  3. The mitral valve is normal in structure. No evidence of mitral valve regurgitation. No evidence of mitral stenosis.  4. The aortic valve is normal in structure. Aortic valve regurgitation is not visualized. No aortic stenosis is present.  5. No clear vegetations but overall suboptimal study. FINDINGS  Left Ventricle: Left ventricular ejection fraction, by estimation, is 60 to 65%. The left ventricle has normal function. The left ventricle has no regional wall motion abnormalities. The left ventricular internal cavity size was normal in size. There is  mild left ventricular hypertrophy. Left ventricular diastolic parameters are consistent with Grade I diastolic dysfunction (impaired relaxation). Right Ventricle: The right ventricular size is normal. No increase in right ventricular wall thickness. Right ventricular systolic function is normal. Tricuspid regurgitation signal is inadequate for assessing PA pressure. Left Atrium: Left atrial size was normal in size. Right Atrium: Right atrial size was normal in size. Pericardium: There is no evidence of pericardial effusion. Mitral Valve: The mitral valve is normal in structure. No evidence of mitral valve regurgitation. No evidence of mitral valve stenosis. MV peak gradient, 6.7 mmHg. The mean mitral valve gradient is 4.0 mmHg. Tricuspid Valve: The tricuspid valve is normal in structure. Tricuspid valve regurgitation  is not demonstrated. No evidence of tricuspid stenosis. Aortic Valve: The aortic valve is normal in structure. Aortic valve regurgitation is not visualized. No aortic stenosis is present. Aortic valve mean gradient measures 6.0 mmHg. Aortic valve peak gradient measures 11.6 mmHg. Aortic valve area, by VTI measures 1.95 cm. Pulmonic Valve: The pulmonic valve was normal in structure. Pulmonic valve regurgitation is not visualized. No evidence of pulmonic stenosis. Aorta: The aortic root is normal in size and structure. Venous: The inferior vena cava was not well visualized. IAS/Shunts: No atrial level shunt detected by color flow Doppler.  LEFT VENTRICLE PLAX 2D LVIDd:         4.30 cm   Diastology LVIDs:  2.74 cm   LV e' medial:    7.29 cm/s LV PW:         1.09 cm   LV E/e' medial:  11.6 LV IVS:        0.78 cm   LV e' lateral:   9.57 cm/s LVOT diam:     1.80 cm   LV E/e' lateral: 8.9 LV SV:         47 LV SV Index:   26 LVOT Area:     2.54 cm  RIGHT VENTRICLE RV Basal diam:  3.77 cm LEFT ATRIUM           Index LA diam:      2.90 cm 1.59 cm/m LA Vol (A4C): 37.1 ml 20.30 ml/m  AORTIC VALVE                     PULMONIC VALVE AV Area (Vmax):    1.93 cm      PV Vmax:       1.18 m/s AV Area (Vmean):   1.94 cm      PV Vmean:      78.200 cm/s AV Area (VTI):     1.95 cm      PV VTI:        0.188 m AV Vmax:           170.00 cm/s   PV Peak grad:  5.6 mmHg AV Vmean:          112.000 cm/s  PV Mean grad:  3.0 mmHg AV VTI:            0.242 m AV Peak Grad:      11.6 mmHg AV Mean Grad:      6.0 mmHg LVOT Vmax:         129.00 cm/s LVOT Vmean:        85.200 cm/s LVOT VTI:          0.185 m LVOT/AV VTI ratio: 0.76  AORTA Ao Root diam: 3.00 cm MITRAL VALVE MV Area (PHT): 6.32 cm     SHUNTS MV Area VTI:   2.27 cm     Systemic VTI:  0.18 m MV Peak grad:  6.7 mmHg     Systemic Diam: 1.80 cm MV Mean grad:  4.0 mmHg MV Vmax:       1.29 m/s MV Vmean:      88.8 cm/s MV Decel Time: 120 msec MV E velocity: 84.80 cm/s MV A velocity:  114.00 cm/s MV E/A ratio:  0.74 Kathlyn Sacramento MD Electronically signed by Kathlyn Sacramento MD Signature Date/Time: 02/17/2021/1:06:03 PM    Final    DG Hip Unilat W or Wo Pelvis 2-3 Views Right  Result Date: 02/14/2021 CLINICAL DATA:  Right hip pain EXAM: DG HIP (WITH OR WITHOUT PELVIS) 2-3V RIGHT COMPARISON:  None. FINDINGS: No recent fracture or dislocation is seen. Degenerative changes are noted with small bony spurs. Degenerative changes are noted in the pubic symphysis and visualized lower lumbar spine. IMPRESSION: No recent fracture or dislocation is seen in the right hip. Degenerative changes with small bony spurs are noted in the right hip. Electronically Signed   By: Elmer Picker M.D.   On: 02/14/2021 15:39     Assessment and Recommendation  71 y.o. female with known cancer with MSSA bacteremia most consistent with her catheter infection and no evidence of endocarditis by transesophageal echocardiogram 1.  Continue supportive care and treatment of MSSA bacteremia not associated with endocarditis  2.  No further cardiac intervention at this time  Signed, Serafina Royals M.D. FACC

## 2021-02-19 NOTE — Progress Notes (Signed)
Physical Therapy Treatment Patient Details Name: Tamara Mejia MRN: [id number] DOB: [birth year] Today's Date: 02/19/2021   History of Present Illness Pt is a 71 y/o F admitted on 02/14/21 with c/c of R hip pain. Pt is admitted for work-up. MRI findings consistent with muscular strain and  associated obturator internus bursitis. Blood cultures positive for MSSA. 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 Pt required only occasional min A with bed mobility tasks and no physical assist with transfers or gait.  Pt was able to increase her max amb distance to 12 feet this session and although pt presented with no overt LOB she ambulated with extremely slow cadence with max cues for upright posture and amb closer to the RW for safety.  Pt will benefit from PT services in an IR setting upon discharge to safely address deficits listed in patient problem list for decreased caregiver assistance and eventual return to PLOF.      Recommendations for follow up therapy are one component of a multi-disciplinary discharge planning process, led by the attending physician.  Recommendations may be updated based on patient status, additional functional criteria and insurance authorization.  Follow Up Recommendations  Acute inpatient rehab (3hours/day)     Assistance Recommended at Discharge Intermittent Supervision/Assistance  Patient can return home with the following A little help with walking and/or transfers;A little help with bathing/dressing/bathroom;Help with stairs or ramp for entrance;Direct supervision/assist for medications management;Assistance with cooking/housework;Direct supervision/assist for financial management;Assist for transportation    Equipment Recommendations  Rolling walker (2 wheels);BSC/3in1    Recommendations for Other Services       Precautions / Restrictions Precautions Precautions: Fall Restrictions Weight Bearing Restrictions: No     Mobility  Bed Mobility Overal bed mobility: Needs Assistance Bed Mobility: Supine to Sit, Sit to Supine     Supine to sit: HOB elevated, Min assist Sit to supine: Min assist   General bed mobility comments: Min A and cues for sequencing during sup to/from sit    Transfers Overall transfer level: Needs assistance Equipment used: Rolling walker (2 wheels) Transfers: Sit to/from Stand Sit to Stand: From elevated surface, Min guard           General transfer comment: Mod verbal cues for hand placement and general sequencing    Ambulation/Gait Ambulation/Gait assistance: Min guard Gait Distance (Feet): 12 Feet Assistive device: Rolling walker (2 wheels) Gait Pattern/deviations: Decreased step length - right, Decreased step length - left, Trunk flexed, Step-through pattern Gait velocity: significanlty decreased     General Gait Details: Slow cadence with short B step length and max cues for amb closer to the RW with upright posture   Stairs             Wheelchair Mobility    Modified Rankin (Stroke Patients Only)       Balance Overall balance assessment: Needs assistance Sitting-balance support: Feet supported Sitting balance-Leahy Scale: Good     Standing balance support: Bilateral upper extremity supported, During functional activity Standing balance-Leahy Scale: Fair                              Cognition Arousal/Alertness: Awake/alert Behavior During Therapy: WFL for tasks assessed/performed Overall Cognitive Status: History of cognitive  impairments - at baseline                                          Exercises Total Joint Exercises Ankle Circles/Pumps: AROM, Strengthening, Both, 10 reps Quad  Sets: Strengthening, Both, 10 reps Hip ABduction/ADduction: AROM, Strengthening, Both, 5 reps Long Arc Quad: AROM, Strengthening, Both, 10 reps Other Exercises Other Exercises: Sit to/from stand training from various height surfaces    General Comments        Pertinent Vitals/Pain Pain Assessment Pain Assessment: 0-10 Pain Score: 10-Worst pain ever Pain Location: R hip Pain Descriptors / Indicators: Aching, Sore Pain Intervention(s): Repositioned, Monitored during session, Patient requesting pain meds-RN notified, RN gave pain meds during session    Home Living                          Prior Function            PT Goals (current goals can now be found in the care plan section) Progress towards PT goals: Progressing toward goals    Frequency    Min 2X/week      PT Plan Current plan remains appropriate    Co-evaluation              AM-PAC PT "6 Clicks" Mobility   Outcome Measure  Help needed turning from your back to your side while in a flat bed without using bedrails?: A Little Help needed moving from lying on your back to sitting on the side of a flat bed without using bedrails?: A Little Help needed moving to and from a bed to a chair (including a wheelchair)?: A Little Help needed standing up from a chair using your arms (e.g., wheelchair or bedside chair)?: A Little Help needed to walk in hospital room?: A Little Help needed climbing 3-5 steps with a railing? : A Lot 6 Click Score: 17    End of Session Equipment Utilized During Treatment: Gait belt Activity Tolerance: Patient tolerated treatment well Patient left: in bed;with call bell/phone within reach;with bed alarm set;Other (comment) (Pt declined up in chair) Nurse Communication: Mobility status PT Visit Diagnosis: Muscle weakness (generalized) (M62.81);Difficulty in walking, not elsewhere classified (R26.2);Unsteadiness on feet (R26.81) Pain - Right/Left: Right Pain - part of body:  Hip     Time: 1411-1440 PT Time Calculation (min) (ACUTE ONLY): 29 min  Charges:  $Gait Training: 8-22 mins $Therapeutic Exercise: 8-22 mins                     D. Scott Kelii Chittum PT, DPT 02/19/21, 3:13 PM

## 2021-02-19 NOTE — Transfer of Care (Signed)
Immediate Anesthesia Transfer of Care Note  Patient: Tamara Mejia  Procedure(s) Performed: TRANSESOPHAGEAL ECHOCARDIOGRAM (TEE)  Patient Location: PACU and Cath Lab  Anesthesia Type:General  Level of Consciousness: awake  Airway & Oxygen Therapy: Patient Spontanous Breathing  Post-op Assessment: Report given to RN  Post vital signs: stable  Last Vitals:  Vitals Value Taken Time  BP 134/110 02/19/21 1227  Temp    Pulse 95 02/19/21 1230  Resp 24 02/19/21 1230  SpO2      Last Pain:  Vitals:   02/19/21 1148  TempSrc: Oral  PainSc: 0-No pain      Patients Stated Pain Goal: 2 (81/44/81 8563)  Complications: No notable events documented.

## 2021-02-19 NOTE — Progress Notes (Signed)
Inpatient Rehab Admissions Coordinator:   I spoke with Pt.'s legal guardian Geraldo Pitter, who states she is considering CIR but also looking at local SNFs for this Pt. She states that she will be unavailable the rest of the day and tomorrow and to contact her Friday for a decision. I have also reached out to Pt.'s group home to discuss what support would be available to the patient there (and if they can accept pt. If she requires any physical assist) and await callback. I notified TOC that it may be prudent to work Pt. Up for potential d/c to SNF in the event that that is what Pt's family desires.   Clemens Catholic, [place], [place] Admissions Coordinator  [phone] ([place]) [phone] (office)

## 2021-02-19 NOTE — CV Procedure (Signed)
Transesophageal echocardiogram preliminary report  Tamara Mejia [id number] 08-20-1950  Preliminary diagnosis  Bacteremia with possible endocarditis  Postprocedural diagnosis Normal LV function without evidence of endocarditis or vegetation  Time out A timeout was performed by the nursing staff and physicians specifically identifying the procedure performed, identification of the patient, the type of sedation, all allergies and medications, all pertinent medical history, and presedation assessment of nasopharynx. The patient and or family understand the risks of the procedure including the rare risks of death, stroke, heart attack, esophogeal perforation, sore throat, and reaction to medications given.  Moderate sedation During this procedure the patient has received propofol to achieve appropriate general sedation.  The patient had continued monitoring of heart rate, oxygenation, blood pressure, respiratory rate, and extent of signs of sedation throughout the entire procedure.  The patient received this  sedation over a period of 14 minutes.  Both the nursing staff and I were present during the procedure when the patient had moderate sedation for 100% of the time.  Treatment considerations  No additional treatment considerations needed for bacteremia due to no current evidence of endocarditis  For further details of transesophageal echocardiogram please refer to final report.  Signed,  Corey Skains M.D. [facility] 02/19/2021 12:26 PM

## 2021-02-19 NOTE — TOC Progression Note (Signed)
Transition of Care [facility]) - Progression Note    Patient Details  Name: Tamara Mejia MRN: [id number] Date of Birth: 1950/07/10  Transition of Care [facility]) CM/SW [place], RN Phone Number: 02/19/2021, 2:23 PM  Clinical Narrative:    Legal guardian considering CIR or SNF< Bedsearch sent Fl2 completed, PASSR obtained        Expected Discharge Plan and Services                                                 Social Determinants of Health (SDOH) Interventions    Readmission Risk Interventions No flowsheet data found.

## 2021-02-19 NOTE — Assessment & Plan Note (Addendum)
Last hemoglobin A1c 7.3.  Currently on sliding scale insulin.  Continue simvastatin.  Sugars trending a little higher we will increase short acting insulin to 4 units prior to meals plus a sliding scale at this point.

## 2021-02-19 NOTE — NC FL2 (Addendum)
[place] LEVEL OF CARE SCREENING TOOL     IDENTIFICATION  Patient Name: Tamara Mejia Birthdate: [birth year] Sex: female Admission Date (Current Location): 02/14/2021  [facility] and [place] Number:  Engineering geologist and Address:  [facility], [address] [place], [place] [address]      Provider Number: [id number]  Attending Physician Name and Address:  Loletha Grayer, MD  Relative Name and Phone Number:  Geraldo Pitter Legal Guardian [phone]    Current Level of Care:   Recommended Level of Care:  LTAC Prior Approval Number:    Date Approved/Denied:   PASRR Number: [id number]  Discharge Plan: LTAC    Current Diagnoses: Patient Active Problem List   Diagnosis Date Noted   Acute anemia 02/18/2021   Lives in group home 02/16/2021   Sepsis ([place]) 02/15/2021   Abnormal urinalysis 02/15/2021   Tachycardia 02/15/2021   MSSA bacteremia 02/15/2021   right Hip pain 02/14/2021   Hyponatremia 02/14/2021   Leukocytosis 02/14/2021   Pneumonia 02/14/2021   Encounter for antineoplastic chemotherapy 12/31/2020   Encounter for monitoring cardiotoxic drug therapy 12/23/2020   Family history of breast cancer 12/11/2020   Malignant neoplasm of left breast in female, estrogen receptor positive ([place]) 10/28/2020   Goals of care, counseling/discussion 10/28/2020   Acute gastroenteritis 04/17/2019   Hypokalemia 04/16/2019   Syncope    Allergic reaction    Hypocalcemia    Hypomagnesemia    Special screening for malignant neoplasms, colon    Rectal polyp    Health care maintenance 07/18/2014   Severe obesity (BMI 35.0-35.9 with comorbidity) ([place]) 01/16/2014   DMII (diabetes mellitus, type 2) ([place]) 07/01/2013   HTN (hypertension), benign 07/01/2013   Hyperlipidemia 07/01/2013   OAB (overactive bladder) 07/01/2013   Seizure ([place]) 07/01/2013   Seizure disorder, primary generalized ([place]) 10/04/2012   Encounter for long-term (current) use  of other medications 10/04/2012    Orientation RESPIRATION BLADDER Height & Weight     Self  Normal Continent, External catheter Weight: 83.9 kg Height:  5\' 1"  (154.9 cm)  BEHAVIORAL SYMPTOMS/MOOD NEUROLOGICAL BOWEL NUTRITION STATUS      Continent Diet (regular)  AMBULATORY STATUS COMMUNICATION OF NEEDS Skin   Extensive Assist Verbally Normal                       Personal Care Assistance Level of Assistance  Bathing, Feeding, Dressing Bathing Assistance: Limited assistance Feeding assistance: Independent Dressing Assistance: Limited assistance     Functional Limitations Info             SPECIAL CARE FACTORS FREQUENCY  PT (By licensed PT) (IV aBX 2-4 week)     PT Frequency: 5 times per week              Contractures Contractures Info: Not present    Additional Factors Info  Code Status, Allergies Code Status Info: full code Allergies Info: nkda           Current Medications (02/19/2021):  This is the current hospital active medication list Current Facility-Administered Medications  Medication Dose Route Frequency Provider Last Rate Last Admin   acetaminophen (TYLENOL) tablet 650 mg  650 mg Oral Q6H PRN Schnier, Dolores Lory, MD   650 mg at 02/19/21 0421   Or   acetaminophen (TYLENOL) suppository 650 mg  650 mg Rectal Q6H PRN Schnier, Dolores Lory, MD       amLODipine (NORVASC) tablet 5 mg  5 mg Oral  Q breakfast Schnier, Dolores Lory, MD   5 mg at 02/18/21 1130   aspirin EC tablet 81 mg  81 mg Oral Q breakfast Schnier, Dolores Lory, MD   81 mg at 02/18/21 1130   butamben-tetracaine-benzocaine (CETACAINE) 02-21-12 % spray            carbamazepine (TEGRETOL) tablet 200 mg  200 mg Oral TID Katha Cabal, MD   200 mg at 02/18/21 2045   ceFAZolin (ANCEF) IVPB 2g/100 mL premix  2 g Intravenous Q8H Schnier, Dolores Lory, MD 200 mL/hr at 02/19/21 1975 2 g at 02/19/21 8832   Chlorhexidine Gluconate Cloth 2 % PADS 6 each  6 each Topical Daily Schnier, Dolores Lory, MD   6 each at  02/19/21 1032   enoxaparin (LOVENOX) injection 40 mg  40 mg Subcutaneous Q24H Schnier, Dolores Lory, MD   40 mg at 02/18/21 2046   hydrALAZINE (APRESOLINE) tablet 25 mg  25 mg Oral Q6H PRN Katha Cabal, MD   25 mg at 02/18/21 5498   insulin aspart (novoLOG) injection 0-20 Units  0-20 Units Subcutaneous TID WC Schnier, Dolores Lory, MD   4 Units at 02/19/21 1015   insulin aspart (novoLOG) injection 0-5 Units  0-5 Units Subcutaneous QHS Katha Cabal, MD   2 Units at 02/16/21 2123   lisinopril (ZESTRIL) tablet 40 mg  40 mg Oral Q breakfast Nicole Kindred A, DO       LORazepam (ATIVAN) injection 2 mg  2 mg Intravenous Q1H PRN Schnier, Dolores Lory, MD       multivitamin with minerals tablet 1 tablet  1 tablet Oral Daily Schnier, Dolores Lory, MD   1 tablet at 02/18/21 1130   ondansetron (ZOFRAN) tablet 4 mg  4 mg Oral Q6H PRN Schnier, Dolores Lory, MD       Or   ondansetron [facility]) injection 4 mg  4 mg Intravenous Q6H PRN Schnier, Dolores Lory, MD       oxyCODONE (Oxy IR/ROXICODONE) immediate release tablet 5 mg  5 mg Oral Q6H PRN Schnier, Dolores Lory, MD   5 mg at 02/19/21 0421   protein supplement (ENSURE MAX) liquid  11 oz Oral BID Katha Cabal, MD   11 oz at 02/18/21 2044   simvastatin (ZOCOR) tablet 20 mg  20 mg Oral QPM Schnier, Dolores Lory, MD   20 mg at 02/18/21 1714   sodium chloride flush (NS) 0.9 % injection 10-40 mL  10-40 mL Intracatheter Q12H Schnier, Dolores Lory, MD   10 mL at 02/19/21 1034   sodium chloride flush (NS) 0.9 % injection 10-40 mL  10-40 mL Intracatheter PRN Schnier, Dolores Lory, MD   10 mL at 02/18/21 2045   sodium chloride flush 0.9 % injection              Discharge Medications: Please see discharge summary for a list of discharge medications.  Relevant Imaging Results:  Relevant Lab Results:   Additional Information SS3 [id number]  Conception Oms, RN

## 2021-02-20 ENCOUNTER — Encounter: Payer: Self-pay | Admitting: Internal Medicine

## 2021-02-20 DIAGNOSIS — R7881 Bacteremia: Secondary | ICD-10-CM | POA: Diagnosis not present

## 2021-02-20 DIAGNOSIS — I10 Essential (primary) hypertension: Secondary | ICD-10-CM

## 2021-02-20 DIAGNOSIS — B9561 Methicillin susceptible Staphylococcus aureus infection as the cause of diseases classified elsewhere: Secondary | ICD-10-CM | POA: Diagnosis not present

## 2021-02-20 DIAGNOSIS — R569 Unspecified convulsions: Secondary | ICD-10-CM

## 2021-02-20 LAB — HAPTOGLOBIN: Haptoglobin: 522 mg/dL — ABNORMAL HIGH (ref 37–355)

## 2021-02-20 LAB — CULTURE, BLOOD (ROUTINE X 2): Special Requests: ADEQUATE

## 2021-02-20 LAB — GLUCOSE, CAPILLARY
Glucose-Capillary: 151 mg/dL — ABNORMAL HIGH (ref 70–99)
Glucose-Capillary: 194 mg/dL — ABNORMAL HIGH (ref 70–99)
Glucose-Capillary: 187 mg/dL — ABNORMAL HIGH (ref 70–99)
Glucose-Capillary: 177 mg/dL — ABNORMAL HIGH (ref 70–99)

## 2021-02-20 NOTE — Progress Notes (Signed)
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  Imaging of the hip including MRI was consistent with muscle strain and likely bursitis.    Patient was started on empiric antibiotics on admission due to leukocytosis, tachycardia and concern for possible UTI and/or pneumonia.  Blood cultures subsequently positive for MSSA.    Admitted with sepsis due to MSSA bacteremia.  On IV antibiotics with further evaluation underway.  Assessment and Plan: * MSSA bacteremia- (present on admission) Source is likely her port.  Port removed on 02/19/2020.  Port tip growing staph aureus.  Will likely need 4 weeks of antibiotics.  TEE 02/19/2021 negative.  Continue Ancef.  Appreciate infectious disease consultation.  Will need PICC line once repeat blood cultures are negative for 48 hours.  Consented guardian for PICC line.  Sepsis ([place])- (present on admission) Present on admission with MSSA bacteremia, leukocytosis and fever.  right Hip pain- (present on admission) Likely bursitis.  Antibiotics would cover if infection.  Severe obesity (BMI 35.0-35.9 with comorbidity) (HCC) BMI 34.96   Seizure (HCC) Continue Tegretol.    HTN (hypertension), benign- (present on admission) Continue current blood pressure regimen.   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 Legal Guardian is Geraldo Pitter [phone].   Type 2 diabetes mellitus  with hyperlipidemia (HCC) Last hemoglobin A1c 7.3.  Currently on sliding scale insulin.  Continue simvastatin.  Acute anemia Last hemoglobin 8.0.  Suspect this is secondary to chemotherapy.  Peripheral smear shows rouleaux formation which likely goes along with infection.  Iron studies consistent with anemia of chronic disease.  Hyponatremia- (present on admission) Today sodium up from 128 to 130  Malignant neoplasm of left breast in female, estrogen receptor positive (HCC) Stable.  Follows with Dr. Tasia Catchings.  Currently on chemotherapy which is now held until IV antibiotic course is complete and infection resolved.   Dr. Tasia Catchings notified of this admission. Port removed today 1/31.  Hypomagnesemia- (present on admission) Continue replacement orally.   Hypokalemia- (present on admission) Resolved with replacement.        Subjective: Patient feeling better.  Being treated for staph aureus sepsis.  Afebrile at this point.  No cough.  No shortness of breath.  Physical Exam: Vitals:   02/19/21 2025 02/20/21 0426 02/20/21 0736 02/20/21 1129  BP: (!) 151/73 (!) 155/80 (!) 167/80 132/78  Pulse: 98 (!) 108 (!) 105 (!) 103  Resp: 16 16 16 16   Temp: 98.8 F (37.1 C) 98.2 F (36.8 C) 98.2 F (36.8 C) 98.3 F (36.8 C)  TempSrc:      SpO2: 90% 98% 95% 100%  Weight:      Height:       Physical Exam HENT:     Head: Normocephalic.     Mouth/Throat:     Pharynx: No oropharyngeal exudate.  Eyes:     General: Lids are normal.     Conjunctiva/sclera: Conjunctivae normal.  Cardiovascular:     Rate and Rhythm: Normal rate and regular rhythm.  Heart sounds: Normal heart sounds, S1 normal and S2 normal.  Pulmonary:     Breath sounds: No decreased breath sounds, wheezing, rhonchi or rales.  Abdominal:     Palpations: Abdomen is soft.     Tenderness: There is no abdominal tenderness.  Musculoskeletal:     Right lower leg: Swelling present.     Left lower leg: Swelling present.     Right  ankle: Swelling present.     Left ankle: Swelling present.  Skin:    General: Skin is warm.     Findings: No rash.  Neurological:     Mental Status: She is alert and oriented to person, place, and time.     Comments: Answer some simple questions appropriately     Data Reviewed: I personally reviewed the catheter culture tip results growing staph aureus and the repeat blood cultures drawn this morning.  So far repeat blood cultures negative to date.  Family Communication: Spoke with guardian on the phone  Disposition: Status is: Inpatient Remains inpatient appropriate because: Need to make sure repeat blood cultures are negative for 48 hours prior to placing PICC line   Planned Discharge Destination: Rehab  Author: Loletha Grayer, MD 02/20/2021 2:14 PM  For on call review [email or web address].

## 2021-02-20 NOTE — Progress Notes (Signed)
ID HAD TEE today- neg for veg Doing well No fever   O/e looks well No discomfort  Patient Vitals for the past 24 hrs:  BP Temp Temp src Pulse Resp SpO2  02/20/21 2004 (!) 151/76 98.6 F (37 C) Oral (!) 103 18 98 %  02/20/21 2004 -- 98.6 F (37 C) Oral -- -- --  02/20/21 1545 (!) 147/57 98.5 F (36.9 C) Oral (!) 101 16 100 %  02/20/21 1129 132/78 98.3 F (36.8 C) -- (!) 103 16 100 %  02/20/21 0736 (!) 167/80 98.2 F (36.8 C) -- (!) 105 16 95 %  02/20/21 0426 (!) 155/80 98.2 F (36.8 C) -- (!) 108 16 98 %    Port removed- dressing  in place Chest b/l air entry [id number] Abd soft CNS grossly non focal  Labs CBC Latest Ref Rng & Units 02/19/2021 02/18/2021 02/17/2021  WBC 4.0 - 10.5 K/uL 7.7 9.2 9.4  Hemoglobin 12.0 - 15.0 g/dL 8.0(L) 7.6(L) 8.5(L)  Hematocrit 36.0 - 46.0 % 24.6(L) 22.9(L) 26.1(L)  Platelets 150 - 400 K/uL 390 375 395    CMP Latest Ref Rng & Units 02/19/2021 02/18/2021 02/17/2021  Glucose 70 - 99 mg/dL 158(H) 197(H) 165(H)  BUN 8 - 23 mg/dL 14 13 10   Creatinine 0.44 - 1.00 mg/dL 0.51 0.63 0.64  Sodium 135 - 145 mmol/L 130(L) 128(L) 131(L)  Potassium 3.5 - 5.1 mmol/L 4.3 4.2 4.3  Chloride 98 - 111 mmol/L 96(L) 94(L) 95(L)  CO2 22 - 32 mmol/L 27 26 29   Calcium 8.9 - 10.3 mg/dL 7.9(L) 7.9(L) 8.3(L)  Total Protein 6.5 - 8.1 g/dL 6.5 6.0(L) 6.2(L)  Total Bilirubin 0.3 - 1.2 mg/dL 0.2(L) 0.5 0.4  Alkaline Phos 38 - 126 U/L 63 67 76  AST 15 - 41 U/L 89(H) 35 58(H)  ALT 0 - 44 U/L 68(H) 39 58(H)    Micro 02/14/21 BC- MSSA 02/17/21 BC MSSA 02/18/21 Cath tip  Impression/recommendation MSSA bacteremia likely due to infected port - high bioburden repeat blood culture from 1/30 was positive as well.  PORT removed 02/18/21- cath tip positive for MSSA  repeat blood culture neg 24 hrs TEE NO endocarditis Continue cefazolin- will need minimum 4 weeks 03/20/21  Rt hip pain- no septic arthritis -possible obturator internus  bursitis  Anemia  Hyponatremia  Hypoalbuminemia   Ca breast on chemo  Discussed the management with patient and care team OPAT orders OPAT Orders Discharge antibiotics: Cefazolin 2 grams IV every 8 hours Duration: 4 week  End Date: 03/20/21   [facility] Care Per Protocol:   Labs weekly Monday while on IV antibiotics: _X_ CBC with differential   _X_ CMP     _X_ Please pull PIC at completion of IV antibiotics     Fax weekly labs to [phone]   Clinic Follow Up Appt:with Dr.Jadriel Saxer  03/11/21 at 9.45am     Call [phone] with any questions

## 2021-02-20 NOTE — Anesthesia Postprocedure Evaluation (Signed)
Anesthesia Post Note  Patient: Tamara Mejia  Procedure(s) Performed: TRANSESOPHAGEAL ECHOCARDIOGRAM (TEE)  Patient location during evaluation: PACU Anesthesia Type: General Level of consciousness: awake and alert Pain management: pain level controlled Vital Signs Assessment: post-procedure vital signs reviewed and stable Respiratory status: spontaneous breathing, nonlabored ventilation, respiratory function stable and patient connected to nasal cannula oxygen Cardiovascular status: blood pressure returned to baseline and stable Postop Assessment: no apparent nausea or vomiting Anesthetic complications: no   No notable events documented.   Last Vitals:  Vitals:   02/20/21 0426 02/20/21 0736  BP: (!) 155/80 (!) 167/80  Pulse: (!) 108 (!) 105  Resp: 16 16  Temp: 36.8 C 36.8 C  SpO2: 98% 95%    Last Pain:  Vitals:   02/20/21 0306  TempSrc:   PainSc: [place] [facility] [place]

## 2021-02-20 NOTE — Progress Notes (Signed)
Vitals entered manually- dynamap wouldn't transfer over

## 2021-02-20 NOTE — Progress Notes (Signed)
Occupational Therapy Treatment Patient Details Name: Tamara Mejia MRN: [id number] DOB: 1950/05/29 Today's Date: 02/20/2021   History of present illness Pt is a 71 y/o F admitted on 02/14/21 with c/c of R hip pain. Pt is admitted for work-up. MRI findings consistent with muscular strain and  associated obturator internus bursitis. Blood cultures positive for MSSA. PMH: L breast CA currently on chemo, childhood meningitis, mild<>moderate learning disability, cognitive dysfunction, tonic-clonic seziures, HTN, obesity, overactive bladder, DM2   OT comments  Pt seen for OT tx this afternoon. Pt in recliner, denies pain, and agreeable to session focused on toileting. Pt required MIN-MOD A +VC for hand placement on arm rests of recliner to push to stand. Pt negotiated obstacles in the room with RW without bumping into anything and no LOB. CGA-MIN A to sit on [facility] +VC for hand placement on arm rests to support slow descent. MIN-MODA to stand with RW again requiring VC for hand placement. Pt unable to let go for pericare, requiring MAX A in standing with BUE support on the RW for stability. Pt noted with some mild BLE edema. Pillows positioned under BLE to float heels and for elevation and socks adjusted to support improved comfort. RN notified of pt's request for a chocolate Ensure. Pt progressing towards goals, requiring decreased assist for ADL transfers this date. Continues to require increased assist for ADL/mobility from baseline. Continue to recommend high intensity skilled OT services at [facility].    Recommendations for follow up therapy are one component of a multi-disciplinary discharge planning process, led by the attending physician.  Recommendations may be updated based on patient status, additional functional criteria and insurance authorization.    Follow Up Recommendations  Acute inpatient rehab (3hours/day)    Assistance Recommended at Discharge Frequent or constant Supervision/Assistance  Patient  can return home with the following  A lot of help with walking and/or transfers;A lot of help with bathing/dressing/bathroom;Direct supervision/assist for medications management;Assist for transportation;Assistance with cooking/housework;Help with stairs or ramp for entrance   Equipment Recommendations  BSC/3in1 (defer to next venue)    Recommendations for Other Services      Precautions / Restrictions Precautions Precautions: Fall Restrictions Weight Bearing Restrictions: Yes RLE Weight Bearing: Weight bearing as tolerated       Mobility Bed Mobility               General bed mobility comments: NT, up in recliner at start and end of session    Transfers Overall transfer level: Needs assistance Equipment used: Rolling walker (2 wheels) Transfers: Sit to/from Stand Sit to Stand: Mod assist, Min assist           General transfer comment: VC for hand placement, MIN-MOD A from recliner and from [facility]     Balance Overall balance assessment: Needs assistance Sitting-balance support: Feet supported, Single extremity supported, Bilateral upper extremity supported Sitting balance-Leahy Scale: Normal Sitting balance - Comments: intermittent BUE vs UE support on RW handles while seated on BSC   Standing balance support: Reliant on assistive device for balance, Bilateral upper extremity supported, During functional activity Standing balance-Leahy Scale: Fair Standing balance comment: BUE on RW for standing pericare                           ADL either performed or assessed with clinical judgement   ADL Overall ADL's : Needs assistance/impaired  Toilet Transfer: BSC/3in1;Rolling walker (2 wheels);Moderate assistance Toilet Transfer Details (indicate cue type and reason): VC for anterior weight shift, hand placement Toileting- Clothing Manipulation and Hygiene: Maximal assistance;Sit to/from stand Toileting - Clothing  Manipulation Details (indicate cue type and reason): pt required BUE support on the RW for stability, max A for pericare     Functional mobility during ADLs: Min guard;Minimal assistance;Cueing for safety;Rolling walker (2 wheels)      Extremity/Trunk Assessment              Vision       Perception     Praxis      Cognition Arousal/Alertness: Awake/alert Behavior During Therapy: WFL for tasks assessed/performed Overall Cognitive Status: History of cognitive impairments - at baseline                                 General Comments: + processing time, follows simple commands well        Exercises      Shoulder Instructions       General Comments      Pertinent Vitals/ Pain       Pain Assessment Pain Assessment: No/denies pain  Home Living                                          Prior Functioning/Environment              Frequency  Min 3X/week        Progress Toward Goals  OT Goals(current goals can now be found in the care plan section)  Progress towards OT goals: Progressing toward goals  Acute Rehab OT Goals Patient Stated Goal: go home OT Goal Formulation: With patient Time For Goal Achievement: 03/04/21 Potential to Achieve Goals: Good  Plan Discharge plan remains appropriate;Frequency remains appropriate    Co-evaluation                 AM-PAC OT "6 Clicks" Daily Activity     Outcome Measure   Help from another person eating meals?: None Help from another person taking care of personal grooming?: A Little Help from another person toileting, which includes using toliet, bedpan, or urinal?: A Lot Help from another person bathing (including washing, rinsing, drying)?: A Lot Help from another person to put on and taking off regular upper body clothing?: A Little Help from another person to put on and taking off regular lower body clothing?: A Lot 6 Click Score: 16    End of Session Equipment  Utilized During Treatment: Rolling walker (2 wheels);Gait belt  OT Visit Diagnosis: Other abnormalities of gait and mobility (R26.89);Pain Pain - Right/Left: Right Pain - part of body: Hip   Activity Tolerance Patient tolerated treatment well   Patient Left in chair;with call bell/phone within reach   Nurse Communication          Time: 4696-2952 OT Time Calculation (min): 20 min  Charges: OT General Charges $OT Visit: 1 Visit OT Treatments $Self Care/Home Management : 8-22 mins  Ardeth Perfect., MPH, MS, OTR/L ascom [phone] 02/20/21, 3:13 PM

## 2021-02-20 NOTE — Progress Notes (Signed)
Physical Therapy Treatment Patient Details Name: Tamara Mejia MRN: [id number] DOB: 1950-06-01 Today's Date: 02/20/2021   History of Present Illness Pt is a 71 y/o F admitted on 02/14/21 with c/c of R hip pain. Pt is admitted for work-up. MRI findings consistent with muscular strain and  associated obturator internus bursitis. Blood cultures positive for MSSA. 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 No pain was reported during activity this session. She continues to require assistance with transfers and ambulation. Increased activity tolerance this session with increased gait distance. Cues required with functional mobility for safety. The patient is not at her baseline level of mobility and would benefit from continued PT to maximize independence to facilitate return to prior level  of function.    Recommendations for follow up therapy are one component of a multi-disciplinary discharge planning process, led by the attending physician.  Recommendations may be updated based on patient status, additional functional criteria and insurance authorization.  Follow Up Recommendations  Acute inpatient rehab (3hours/day)     Assistance Recommended at Discharge Intermittent Supervision/Assistance  Patient can return home with the following A little help with walking and/or transfers;A little help with bathing/dressing/bathroom;Help with stairs or ramp for entrance;Direct supervision/assist for medications management;Assistance with cooking/housework;Direct supervision/assist for financial management;Assist for transportation   Equipment Recommendations  Rolling walker (2 wheels);BSC/3in1    Recommendations for Other Services       Precautions / Restrictions Precautions Precautions: Fall Restrictions Weight Bearing  Restrictions: Yes RLE Weight Bearing: Weight bearing as tolerated     Mobility  Bed Mobility               General bed mobility comments: not observed as patient sitting up on arrival to room and post session    Transfers Overall transfer level: Needs assistance Equipment used: Rolling walker (2 wheels) Transfers: Sit to/from Stand Sit to Stand: Min assist, Mod assist           General transfer comment: patient has difficulty with following commands for safety with hand placement with transfers despite moderate verbal cues. Min A required for standing from bed and Mod A required for standing from bed side commode.    Ambulation/Gait Ambulation/Gait assistance: Min guard Gait Distance (Feet): 38 Feet Assistive device: Rolling walker (2 wheels) Gait Pattern/deviations: Decreased step length - right, Decreased step length - left, Trunk flexed, Step-through pattern Gait velocity: decreased     General Gait Details: verbal cues for upright posture and for body mechanics/appropriate placement of rolling walker from base of support. safety cues also provided with general navigation in the room, turns, and avoiding obstacles with rolling walker. patient fatigued with activity but with increased activity tolerance compared to previous session. Sp02 90% on room air, heart rate 120bpm immediately after walking.   Stairs             Wheelchair Mobility    Modified Rankin (Stroke Patients Only)       Balance Overall balance assessment: Needs assistance Sitting-balance support: Feet supported Sitting balance-Leahy Scale: Good     Standing balance support: Reliant on assistive device for balance, Bilateral upper extremity supported, During functional activity Standing balance-Leahy Scale: Fair  Cognition Arousal/Alertness: Awake/alert Behavior During Therapy: WFL for tasks assessed/performed Overall Cognitive Status: History  of cognitive impairments - at baseline                                 General Comments: patient is able to follow single step commands consistently. increased time for processing        Exercises General Exercises - Lower Extremity Ankle Circles/Pumps: AROM, Strengthening, Both, 10 reps, Seated Long Arc Quad: AAROM, Strengthening, Both, 10 reps, Seated Hip ABduction/ADduction: AAROM, Strengthening, Both, 10 reps, Seated Other Exercises Other Exercises: verbal and visual cues for exercise technique for strengthening. no reported pain with LE exercises    General Comments        Pertinent Vitals/Pain Pain Assessment Pain Assessment: No/denies pain    Home Living                          Prior Function            PT Goals (current goals can now be found in the care plan section) Acute Rehab PT Goals Patient Stated Goal: to return home PT Goal Formulation: With patient Time For Goal Achievement: 03/01/21 Potential to Achieve Goals: Fair Progress towards PT goals: Progressing toward goals    Frequency    Min 2X/week      PT Plan Current plan remains appropriate    Co-evaluation              AM-PAC PT "6 Clicks" Mobility   Outcome Measure  Help needed turning from your back to your side while in a flat bed without using bedrails?: A Little Help needed moving from lying on your back to sitting on the side of a flat bed without using bedrails?: A Little Help needed moving to and from a bed to a chair (including a wheelchair)?: A Little Help needed standing up from a chair using your arms (e.g., wheelchair or bedside chair)?: A Little Help needed to walk in hospital room?: A Little Help needed climbing 3-5 steps with a railing? : A Lot 6 Click Score: 17    End of Session   Activity Tolerance: Patient tolerated treatment well Patient left: in chair;with call bell/phone within reach Nurse Communication: Mobility status PT Visit  Diagnosis: Muscle weakness (generalized) (M62.81);Difficulty in walking, not elsewhere classified (R26.2);Unsteadiness on feet (R26.81)     Time: 9702-6378 PT Time Calculation (min) (ACUTE ONLY): 25 min  Charges:  $Gait Training: 8-22 mins $Therapeutic Activity: 8-22 mins                    Minna Merritts, PT, MPT    Percell Locus 02/20/2021, 12:29 PM

## 2021-02-20 NOTE — Treatment Plan (Deleted)
Diagnosis: MSSA bacteremia- PORT infection Baseline Creatinine <1   No Known Allergies  OPAT Orders Discharge antibiotics: Cefazolin 2 grams IV every 8 hours Duration: 4 week  End Date: 03/20/21  [facility] Care Per Protocol:  Labs weekly while on IV antibiotics: _X_ CBC with differential  _X_ CMP   _X_ Please pull PIC at completion of IV antibiotics   Fax weekly labs to [phone]  Clinic Follow Up Appt:with Dr.Joby Hershkowitz  03/11/21 at 9.45am   Call [phone] with any questions

## 2021-02-21 ENCOUNTER — Telehealth: Payer: Self-pay

## 2021-02-21 ENCOUNTER — Inpatient Hospital Stay: Payer: Medicare Other

## 2021-02-21 DIAGNOSIS — D649 Anemia, unspecified: Secondary | ICD-10-CM | POA: Diagnosis not present

## 2021-02-21 DIAGNOSIS — E8889 Other specified metabolic disorders: Secondary | ICD-10-CM

## 2021-02-21 DIAGNOSIS — M4620 Osteomyelitis of vertebra, site unspecified: Secondary | ICD-10-CM | POA: Diagnosis not present

## 2021-02-21 DIAGNOSIS — R7881 Bacteremia: Secondary | ICD-10-CM | POA: Diagnosis not present

## 2021-02-21 DIAGNOSIS — B9561 Methicillin susceptible Staphylococcus aureus infection as the cause of diseases classified elsewhere: Secondary | ICD-10-CM | POA: Diagnosis not present

## 2021-02-21 LAB — BLOOD CULTURE ID PANEL (REFLEXED) - BCID2

## 2021-02-21 LAB — CATH TIP CULTURE: Culture: 20000 — AB

## 2021-02-21 LAB — GLUCOSE, CAPILLARY
Glucose-Capillary: 181 mg/dL — ABNORMAL HIGH (ref 70–99)
Glucose-Capillary: 134 mg/dL — ABNORMAL HIGH (ref 70–99)
Glucose-Capillary: 144 mg/dL — ABNORMAL HIGH (ref 70–99)
Glucose-Capillary: 153 mg/dL — ABNORMAL HIGH (ref 70–99)

## 2021-02-21 MED ORDER — GADOBUTROL 1 MMOL/ML IV SOLN
8.0000 mL | Freq: Once | INTRAVENOUS | Status: AC | PRN
  Administered 2021-02-21: 8 mL via INTRAVENOUS

## 2021-02-21 MED ORDER — SODIUM CHLORIDE 0.9 % IV SOLN
12.0000 g | INTRAVENOUS | Status: DC
  Administered 2021-02-21 – 2021-02-27 (×7): 12 g via INTRAVENOUS
  Filled 2021-02-21 (×10): qty 48

## 2021-02-21 MED ORDER — SODIUM CHLORIDE 0.9 % IV SOLN
2.0000 g | Freq: Once | INTRAVENOUS | Status: AC
  Administered 2021-02-21: 2 g via INTRAVENOUS
  Filled 2021-02-21: qty 8

## 2021-02-21 NOTE — Progress Notes (Signed)
[facility] IP rehab admissions - noted that legal guardian is seeking SNF placement at [facility] resources.  I will not pursue inpatient rehab admission at [facility] at this point.  Call for questions.  [phone]

## 2021-02-21 NOTE — Assessment & Plan Note (Signed)
Continue Zocor 

## 2021-02-21 NOTE — Progress Notes (Signed)
°  Case discussed with Dr. Steva Ready.  We will get an MRI of the right hip and MRI of the lumbar sacral spine.  Patient has persistent bacteremia with staph aureus.  Likely source still the port but with persistent bacteremia wanted to make sure she has not seeded anywhere else or has another source of infection.  Dr Loletha Grayer

## 2021-02-21 NOTE — Progress Notes (Signed)
Vitals entered manually- dynamap wouldn't validate

## 2021-02-21 NOTE — Progress Notes (Signed)
Occupational Therapy Treatment Patient Details Name: Tamara Mejia MRN: [id number] DOB: 1950/04/30 Today's Date: 02/21/2021   History of present illness Pt is a 71 y/o F admitted on 02/14/21 with c/c of R hip pain. Pt is admitted for work-up. MRI findings consistent with muscular strain and  associated obturator internus bursitis. Blood cultures positive for MSSA. 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 Pt returning to recliner with nurse tech after toileting on [facility] close to the recliner. Pt agreeable to OT, denies pain. Pt required CGA + extra time to complete STS transfer from recliner with VC for hand placement as she pulls up on the RW with BUE instead of pushing up from the armrests. Pt walked to the sink with CGA + RW to perform standing grooming tasks. Pt tolerated standing ~52min with intermittent UE vs BUE forearm support on counter to brush her teeth and wash her faces/hands. Pt endorsed fatigue and returned to the recliner to apply deodorant with set up (pt unfamiliar with roll on type). While negotiating obstacles in the room, pt required CGA and MIN VC for RW safety, as she bumped into BSC leg with RW, and able to correct in standing without LOB. Pt progressing towards goals, however, continues to benefit from higher intensity skilled OT services to maximize return to PLOF. Pt endorses eagerness to return to her classes she was taking prior to this.    Recommendations for follow up therapy are one component of a multi-disciplinary discharge planning process, led by the attending physician.  Recommendations may be updated based on patient status, additional functional criteria and insurance authorization.    Follow Up Recommendations  Acute inpatient rehab (3hours/day)    Assistance Recommended at Discharge  Frequent or constant Supervision/Assistance  Patient can return home with the following  A lot of help with bathing/dressing/bathroom;Direct supervision/assist for medications management;Assist for transportation;Assistance with cooking/housework;Help with stairs or ramp for entrance;A little help with walking and/or transfers   Equipment Recommendations  BSC/3in1;Other (comment) (reacher, LH sponge)    Recommendations for Other Services      Precautions / Restrictions Precautions Precautions: Fall Restrictions Weight Bearing Restrictions: Yes RLE Weight Bearing: Weight bearing as tolerated       Mobility Bed Mobility               General bed mobility comments: NT, up in recliner at start and end of session    Transfers Overall transfer level: Needs assistance Equipment used: Rolling walker (2 wheels) Transfers: Sit to/from Stand Sit to Stand: Min guard           General transfer comment: VC for hand placement, CGA + time to fully come to standing from recliner with RW     Balance Overall balance assessment: Needs assistance Sitting-balance support: Bilateral upper extremity supported Sitting balance-Leahy Scale: Good     Standing balance support: Single extremity supported, During functional activity, Reliant on assistive device for balance Standing balance-Leahy Scale: Fair Standing balance comment: Pt able to complete grooming tasks standing at the sink with UE vs BUE support (forearms) on countertop for stability, reliant on RW for ADL mobility                           ADL either performed or assessed with clinical judgement   ADL Overall  ADL's : Needs assistance/impaired     Grooming: Wash/dry hands;Wash/dry face;Oral care;Applying deodorant;Sitting;Standing;Supervision/safety;Min guard Grooming Details (indicate cue type and reason): Pt stood at the sink with UE support on counter to brush her teeth and wash her face/hands. Pt endorsed  fatigue after completing (~55min standing) and returned to recliner to apply deodorant in sitting.                                    Extremity/Trunk Assessment              Vision       Perception     Praxis      Cognition Arousal/Alertness: Awake/alert Behavior During Therapy: WFL for tasks assessed/performed Overall Cognitive Status: History of cognitive impairments - at baseline                                 General Comments: VC for sequencing/safety with RW, +processing time        Exercises Other Exercises Other Exercises: Pt instructed in RW safety while negotiating obstacles in the room with RW, with pt noted to bump into BSC leg with RW once and able to correct with 1 VC and CGA.    Shoulder Instructions       General Comments      Pertinent Vitals/ Pain       Pain Assessment Pain Assessment: No/denies pain  Home Living                                          Prior Functioning/Environment              Frequency  Min 3X/week        Progress Toward Goals  OT Goals(current goals can now be found in the care plan section)  Progress towards OT goals: Progressing toward goals  Acute Rehab OT Goals Patient Stated Goal: go home OT Goal Formulation: With patient Time For Goal Achievement: 03/04/21 Potential to Achieve Goals: Good  Plan Discharge plan remains appropriate;Frequency remains appropriate    Co-evaluation                 AM-PAC OT "6 Clicks" Daily Activity     Outcome Measure   Help from another person eating meals?: None Help from another person taking care of personal grooming?: A Little Help from another person toileting, which includes using toliet, bedpan, or urinal?: A Lot Help from another person bathing (including washing, rinsing, drying)?: A Lot Help from another person to put on and taking off regular upper body clothing?: A Little Help from another person to  put on and taking off regular lower body clothing?: A Lot 6 Click Score: 16    End of Session Equipment Utilized During Treatment: Rolling walker (2 wheels);Gait belt  OT Visit Diagnosis: Other abnormalities of gait and mobility (R26.89)   Activity Tolerance Patient tolerated treatment well   Patient Left in chair;with call bell/phone within reach   Nurse Communication Other (comment) (no green box in room for chair alarm)        Time: [id number] OT Time Calculation (min): 12 min  Charges: OT General Charges $OT Visit: 1 Visit OT Treatments $Self Care/Home Management : 8-22 mins  Ardeth Perfect., MPH, MS,  OTR/L ascom [phone] 02/21/21, 10:19 AM

## 2021-02-21 NOTE — Telephone Encounter (Signed)
Spoke to Dana, [place], and informed her of follow up plan. She will be available for phone call during MD visit on 3/8 @ 2:45. Patient has been scheduled and Winferd Humphrey has informed caregiver Aniceto Boss. Noreene Larsson would like for Vinnie Level [phone] to be phoned in at visit as well.

## 2021-02-21 NOTE — Progress Notes (Signed)
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  Imaging of the hip including MRI was consistent with muscle strain and likely bursitis.    Patient was started on empiric antibiotics on admission due to leukocytosis, tachycardia and concern for possible UTI and/or pneumonia.  Blood cultures subsequently positive for MSSA.    Admitted with sepsis due to MSSA bacteremia.  On IV antibiotics with further evaluation underway.  Assessment and Plan: * MSSA bacteremia- (present on admission) Unfortunately she has another positive blood culture from the repeat cultures on 02/20/2021.  Case discussed with infectious disease specialist infectious disease pharmacist and antibiotics will be switched over to nafcillin continuous infusion.  We will likely have to repeat cultures again in a few days.  Those cultures will have to be negative for 48 hours prior to placing a PICC line.  Sepsis ([place])- (present on admission) Present on admission with MSSA bacteremia, leukocytosis and fever.  right Hip pain- (present on admission) Likely bursitis.  Antibiotics would cover if this is infection.  No pain over the right hip bursa today.  Patient was able to straight leg raise without a problem.  Severe obesity (BMI 35.0-35.9 with comorbidity) (HCC) BMI 34.96   Seizure (HCC) Continue Tegretol.    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 Legal Guardian is Geraldo Pitter [phone].   Type 2 diabetes mellitus with hyperlipidemia (HCC) Last hemoglobin A1c 7.3.  Currently on sliding scale insulin.  Continue simvastatin.  Acute anemia Last hemoglobin 8.0.  Suspect this is secondary to chemotherapy.  Peripheral smear shows rouleaux formation which likely goes along with infection.  Iron studies consistent with anemia of chronic disease.  Hyponatremia- (present on admission) Last sodium 130.  Recheck tomorrow.  Malignant neoplasm of left breast in female, estrogen receptor positive ([place]) Stable.  Follows with Dr. Tasia Catchings.  Currently on chemotherapy which is now held until IV antibiotic course is complete and infection resolved.   Dr. Tasia Catchings notified of this admission. Port removed today 1/31.  Hypomagnesemia- (present on admission) Continue replacement orally.   Hypokalemia- (present on admission) Resolved with replacement.  Hyperlipidemia-resolved as of 02/19/2021, (present on admission) Continue Zocor        Subjective: Patient feels okay and offers no complaints.  No complaints of hip pain today.  No back pain anywhere in the spine.  Walking better with physical therapy.  Initially admitted with sepsis.  Physical Exam: Vitals:   02/20/21 2004 02/21/21 0418 02/21/21 0830 02/21/21 1207  BP: (!) 151/76 (!) 150/84 (!) 147/57 131/74  Pulse: (!) 103 (!) 102 (!) 101 98  Resp: 18 20 18 16   Temp: 98.6 F (37 C) 98.5 F (36.9 C) 98.4 F (36.9 C) 98.2 F (36.8 C)  TempSrc: Oral  Oral   SpO2: 98% 93% 95% 96%  Weight:      Height:       Physical Exam HENT:     Head:  Normocephalic.     Mouth/Throat:     Pharynx: No oropharyngeal exudate.  Eyes:     General: Lids are normal.     Conjunctiva/sclera: Conjunctivae normal.  Cardiovascular:     Rate and Rhythm: Normal rate and regular rhythm.     Heart sounds: Normal heart sounds, S1 normal  and S2 normal.  Pulmonary:     Breath sounds: No decreased breath sounds, wheezing, rhonchi or rales.  Abdominal:     Palpations: Abdomen is soft.     Tenderness: There is no abdominal tenderness.  Musculoskeletal:     Right lower leg: Swelling present.     Left lower leg: Swelling present.     Right ankle: Swelling present.     Left ankle: Swelling present.     Comments: No pain to palpation over right hip  Skin:    General: Skin is warm.     Findings: No rash.  Neurological:     Mental Status: She is alert and oriented to person, place, and time.     Comments: Answer some simple questions appropriately.  Patient able to straight leg raise bilaterally.  No pain over the cervical, thoracic or lumbar spine to palpation.     Data Reviewed: Reviewed blood culture positive from 02/20/2021 with staff aureus.  Case discussed with infectious disease specialist and infectious disease pharmacist.  Family Communication: Spoke with guardian on the phone  Disposition: Status is: Inpatient Remains inpatient appropriate because: Patient still has staff aureus positive blood culture from 02/20/2021.  We will have to wait a few days with change of antibiotic to nafcillin continuous infusion and recheck blood cultures prior to placing PICC line.  Planned Discharge Destination: Skilled nursing facility With repeat blood cultures being positive this will continue our hospital stay into mid next week at least.  Author: Loletha Grayer, MD 02/21/2021 1:36 PM  For on call review [email or web address].

## 2021-02-21 NOTE — Progress Notes (Signed)
ID Doing well No fever No complaint   O/e looks well No discomfort  Patient Vitals for the past 24 hrs:  BP Temp Temp src Pulse Resp SpO2  02/21/21 1207 131/74 98.2 F (36.8 C) -- 98 16 96 %  02/21/21 0830 (!) 147/57 98.4 F (36.9 C) Oral (!) 101 18 95 %  02/21/21 0418 (!) 150/84 98.5 F (36.9 C) -- (!) 102 20 93 %  02/20/21 2004 (!) 151/76 98.6 F (37 C) Oral (!) 103 18 98 %  02/20/21 2004 -- 98.6 F (37 C) Oral -- -- --  02/20/21 1545 (!) 147/57 98.5 F (36.9 C) Oral (!) 101 16 100 %    Port removed- site looks well Chest b/l air entry [id number] Abd soft CNS grossly non focal Moving rt lower extremity well  Labs CBC Latest Ref Rng & Units 02/19/2021 02/18/2021 02/17/2021  WBC 4.0 - 10.5 K/uL 7.7 9.2 9.4  Hemoglobin 12.0 - 15.0 g/dL 8.0(L) 7.6(L) 8.5(L)  Hematocrit 36.0 - 46.0 % 24.6(L) 22.9(L) 26.1(L)  Platelets 150 - 400 K/uL 390 375 395    CMP Latest Ref Rng & Units 02/19/2021 02/18/2021 02/17/2021  Glucose 70 - 99 mg/dL 158(H) 197(H) 165(H)  BUN 8 - 23 mg/dL 14 13 10   Creatinine 0.44 - 1.00 mg/dL 0.51 0.63 0.64  Sodium 135 - 145 mmol/L 130(L) 128(L) 131(L)  Potassium 3.5 - 5.1 mmol/L 4.3 4.2 4.3  Chloride 98 - 111 mmol/L 96(L) 94(L) 95(L)  CO2 22 - 32 mmol/L 27 26 29   Calcium 8.9 - 10.3 mg/dL 7.9(L) 7.9(L) 8.3(L)  Total Protein 6.5 - 8.1 g/dL 6.5 6.0(L) 6.2(L)  Total Bilirubin 0.3 - 1.2 mg/dL 0.2(L) 0.5 0.4  Alkaline Phos 38 - 126 U/L 63 67 76  AST 15 - 41 U/L 89(H) 35 58(H)  ALT 0 - 44 U/L 68(H) 39 58(H)    Micro 02/14/21 BC- MSSA 02/17/21 BC MSSA 02/18/21 Cath tip 2/2/BC 1 of 4 positive  Impression/recommendation MSSA bacteremia  due to infected port - high bioburden repeat blood culture from 1/30 was positive as well.  PORT removed 02/18/21- cath tip positive for MSSA  repeat blood culture from 2/2 is still positive TEE NO endocarditis Change cefazolin to nafcillin Concern that there is an unidentified deep focus Need MRI lumbar spine and pelvis Cannot  place PICC unless we get a neg blood culture  Rt hip pain-resolved  no septic arthritis -possible obturator internus bursitis  Anemia  Hyponatremia  Hypoalbuminemia   Ca breast on chemo  Discussed the management with patient and care team

## 2021-02-21 NOTE — Progress Notes (Signed)
PHARMACY - PHYSICIAN COMMUNICATION CRITICAL VALUE ALERT - BLOOD CULTURE IDENTIFICATION (BCID)  Tamara Mejia is an 71 y.o. female who presented to [facility] on 02/14/2021 with a chief complaint of hip pain  Assessment:  MSSA bacteremia with port as likely source, Port removed 1/31.  Repeat Blood culture remain positive  Name of physician (or Provider) Contacted: Dr Delaine Lame  Current antibiotics: cefazolin  Changes to prescribed antibiotics recommended:  Change to nafcillin per ID  Results for orders placed or performed during the hospital encounter of 02/14/21  Blood Culture ID Panel (Reflexed) (Collected: 02/20/2021  3:46 AM)  Result Value Ref Range   Enterococcus faecalis NOT DETECTED NOT DETECTED   Enterococcus Faecium NOT DETECTED NOT DETECTED   Listeria monocytogenes NOT DETECTED NOT DETECTED   Staphylococcus species DETECTED (A) NOT DETECTED   Staphylococcus aureus (BCID) DETECTED (A) NOT DETECTED   Staphylococcus epidermidis NOT DETECTED NOT DETECTED   Staphylococcus lugdunensis NOT DETECTED NOT DETECTED   Streptococcus species NOT DETECTED NOT DETECTED   Streptococcus agalactiae NOT DETECTED NOT DETECTED   Streptococcus pneumoniae NOT DETECTED NOT DETECTED   Streptococcus pyogenes NOT DETECTED NOT DETECTED   A.calcoaceticus-baumannii NOT DETECTED NOT DETECTED   Bacteroides fragilis NOT DETECTED NOT DETECTED   Enterobacterales NOT DETECTED NOT DETECTED   Enterobacter cloacae complex NOT DETECTED NOT DETECTED   Escherichia coli NOT DETECTED NOT DETECTED   Klebsiella aerogenes NOT DETECTED NOT DETECTED   Klebsiella oxytoca NOT DETECTED NOT DETECTED   Klebsiella pneumoniae NOT DETECTED NOT DETECTED   Proteus species NOT DETECTED NOT DETECTED   Salmonella species NOT DETECTED NOT DETECTED   Serratia marcescens NOT DETECTED NOT DETECTED   Haemophilus influenzae NOT DETECTED NOT DETECTED   Neisseria meningitidis NOT DETECTED NOT DETECTED   Pseudomonas aeruginosa NOT  DETECTED NOT DETECTED   Stenotrophomonas maltophilia NOT DETECTED NOT DETECTED   Candida albicans NOT DETECTED NOT DETECTED   Candida auris NOT DETECTED NOT DETECTED   Candida glabrata NOT DETECTED NOT DETECTED   Candida krusei NOT DETECTED NOT DETECTED   Candida parapsilosis NOT DETECTED NOT DETECTED   Candida tropicalis NOT DETECTED NOT DETECTED   Cryptococcus neoformans/gattii NOT DETECTED NOT DETECTED   Meth resistant mecA/C and MREJ NOT DETECTED NOT DETECTED   Doreene Eland, PharmD, BCPS, BCIDP Work Cell: [phone] 02/21/2021 4:25 PM

## 2021-02-21 NOTE — Telephone Encounter (Signed)
-----   Message from Earlie Server, MD sent at 02/21/2021 11:55 AM EST ----- ID suggests 4 weeks of antibiotics.  Will hold of chemo while on abx.  Her port was infected.  I would like to see her back after she finished abx. Just MD visit  also prefer the POA to be available on the phone during the encounter to discuss future plan.

## 2021-02-21 NOTE — TOC Progression Note (Signed)
Transition of Care [facility]) - Progression Note    Patient Details  Name: Tamara Mejia MRN: [id number] Date of Birth: 07-Feb-1950  Transition of Care [facility]) CM/SW [place], RN Phone Number: 02/21/2021, 12:30 PM  Clinical Narrative:   Per conversation with Physician, The patient's group hom is next to [facility] and they would like a bed at [facility], I contacted tammy at [facility] to see if they woul dbe able to hold the bed until mid week         Expected Discharge Plan and Services                                                 Social Determinants of Health (SDOH) Interventions    Readmission Risk Interventions No flowsheet data found.

## 2021-02-21 NOTE — Plan of Care (Signed)
°  Problem: Education: Goal: Knowledge of General Education information will improve Description: Including pain rating scale, medication(s)/side effects and non-pharmacologic comfort measures Outcome: Progressing   Problem: Clinical Measurements: Goal: Ability to maintain clinical measurements within normal limits will improve Outcome: Progressing   Problem: Clinical Measurements: Goal: Will remain free from infection Outcome: Progressing   Problem: Clinical Measurements: Goal: Respiratory complications will improve Outcome: Progressing   Problem: Coping: Goal: Level of anxiety will decrease Outcome: Progressing   Problem: Safety: Goal: Ability to remain free from injury will improve Outcome: Progressing   Problem: Skin Integrity: Goal: Risk for impaired skin integrity will decrease Outcome: Progressing

## 2021-02-22 DIAGNOSIS — D649 Anemia, unspecified: Secondary | ICD-10-CM | POA: Diagnosis not present

## 2021-02-22 DIAGNOSIS — M4620 Osteomyelitis of vertebra, site unspecified: Secondary | ICD-10-CM | POA: Diagnosis not present

## 2021-02-22 DIAGNOSIS — B9561 Methicillin susceptible Staphylococcus aureus infection as the cause of diseases classified elsewhere: Secondary | ICD-10-CM | POA: Diagnosis not present

## 2021-02-22 DIAGNOSIS — R7881 Bacteremia: Secondary | ICD-10-CM | POA: Diagnosis not present

## 2021-02-22 LAB — GLUCOSE, CAPILLARY
Glucose-Capillary: 131 mg/dL — ABNORMAL HIGH (ref 70–99)
Glucose-Capillary: 161 mg/dL — ABNORMAL HIGH (ref 70–99)
Glucose-Capillary: 164 mg/dL — ABNORMAL HIGH (ref 70–99)
Glucose-Capillary: 181 mg/dL — ABNORMAL HIGH (ref 70–99)
Glucose-Capillary: 194 mg/dL — ABNORMAL HIGH (ref 70–99)
Glucose-Capillary: 196 mg/dL — ABNORMAL HIGH (ref 70–99)

## 2021-02-22 LAB — COMPREHENSIVE METABOLIC PANEL
ALT: 65 U/L — ABNORMAL HIGH (ref 0–44)
AST: 58 U/L — ABNORMAL HIGH (ref 15–41)
Albumin: 2.2 g/dL — ABNORMAL LOW (ref 3.5–5.0)
Alkaline Phosphatase: 75 U/L (ref 38–126)
Anion gap: 5 (ref 5–15)
BUN: 12 mg/dL (ref 8–23)
CO2: 27 mmol/L (ref 22–32)
Calcium: 8.4 mg/dL — ABNORMAL LOW (ref 8.9–10.3)
Chloride: 98 mmol/L (ref 98–111)
Creatinine, Ser: 0.6 mg/dL (ref 0.44–1.00)
GFR, Estimated: >60 mL/min (ref >60)
Glucose, Bld: 154 mg/dL — ABNORMAL HIGH (ref 70–99)
Potassium: 4.2 mmol/L (ref 3.5–5.1)
Sodium: 130 mmol/L — ABNORMAL LOW (ref 135–145)
Total Bilirubin: 0.3 mg/dL (ref 0.3–1.2)
Total Protein: 7.2 g/dL (ref 6.5–8.1)

## 2021-02-22 LAB — CBC
HCT: 26.3 % — ABNORMAL LOW (ref 36.0–46.0)
Hemoglobin: 8.5 g/dL — ABNORMAL LOW (ref 12.0–15.0)
MCH: 30.1 pg (ref 26.0–34.0)
MCHC: 32.3 g/dL (ref 30.0–36.0)
MCV: 93.3 fL (ref 80.0–100.0)
Platelets: 417 10*3/uL — ABNORMAL HIGH (ref 150–400)
RBC: 2.82 MIL/uL — ABNORMAL LOW (ref 3.87–5.11)
RDW: 14.6 % (ref 11.5–15.5)
WBC: 6.6 10*3/uL (ref 4.0–10.5)
nRBC: 0 % (ref 0.0–0.2)

## 2021-02-22 LAB — CULTURE, BLOOD (ROUTINE X 2): Culture: NO GROWTH

## 2021-02-22 NOTE — Consult Note (Signed)
Referring Physician:  No referring provider defined for this encounter.  Primary Physician:  Kirk Ruths, MD  Chief Complaint:  epidural abscess  History of Present Illness: 02/22/2021 Tamara Mejia is a 71 y.o. female who presents with the chief complaint of hip pain.  She had evidence of infection which ultimately showed MSSA bacteremia.  She has an indwelling port for cancer treatment, which was removed. IT was also positive.    MRI showed lumbar spinal abscess.  She has no complaints currently.   Review of Systems:  A 10 point review of systems is negative, except for the pertinent positives and negatives detailed in the HPI.  Past Medical History: Past Medical History:  Diagnosis Date   Breast cancer ([place])    Cognitive dysfunction    mental retardation (mild to moderate)   Diabetes mellitus, type 2 (HCC)    diet controlled   Family history of breast cancer    High cholesterol    History of 2019 novel coronavirus disease (COVID-19) 11/04/2020   a.) resides in group home; others were sick with SARS-Cov-2. Home test (+).   Hypertension    Meningitis    in childhood   Obesity (BMI 30-39.9)    Overactive bladder    Seizure ([place])    last seizure 2000    Past Surgical History: Past Surgical History:  Procedure Laterality Date   ABDOMINAL HYSTERECTOMY     as a teenager   BREAST BIOPSY Right    neg   BREAST BIOPSY Left 10/18/2020   u/s breast  biopsy, "venus" clip-INVASIVE MAMMARY CARCINOMA   CHOLECYSTECTOMY     COLONOSCOPY WITH PROPOFOL N/A 08/27/2015   Procedure: COLONOSCOPY WITH PROPOFOL;  Surgeon: Lucilla Lame, MD;  Location: [facility];  Service: Endoscopy;  Laterality: N/A;   PART MASTECTOMY,RADIO FREQUENCY LOCALIZER,AXILLARY SENTINEL NODE BIOPSY Left 11/22/2020   Procedure: PART MASTECTOMY, RADIO FREQUENCY LOCALIZER,AXILLARY SENTINEL NODE BIOPSY;  Surgeon: Herbert Pun, MD;  Location: [facility];  Service: General;  Laterality: Left;    PORTA CATH REMOVAL N/A 02/18/2021   Procedure: PORTA CATH REMOVAL;  Surgeon: Katha Cabal, MD;  Location: [place] [facility];  Service: Cardiovascular;  Laterality: N/A;   PORTACATH PLACEMENT N/A 12/31/2020   Procedure: INSERTION PORT-A-CATH;  Surgeon: Herbert Pun, MD;  Location: [facility];  Service: General;  Laterality: N/A;   TEE WITHOUT CARDIOVERSION N/A 02/19/2021   Procedure: TRANSESOPHAGEAL ECHOCARDIOGRAM (TEE);  Surgeon: Corey Skains, MD;  Location: [facility];  Service: Cardiovascular;  Laterality: N/A;    Allergies: Allergies as of 02/14/2021   (No Known Allergies)    Medications:  Current Facility-Administered Medications:    acetaminophen (TYLENOL) tablet 650 mg, 650 mg, Oral, Q6H PRN, 650 mg at 02/19/21 0421 **OR** acetaminophen (TYLENOL) suppository 650 mg, 650 mg, Rectal, Q6H PRN, Schnier, Dolores Lory, MD   amLODipine (NORVASC) tablet 5 mg, 5 mg, Oral, Q breakfast, Schnier, Dolores Lory, MD, 5 mg at 02/21/21 0263   aspirin EC tablet 81 mg, 81 mg, Oral, Q breakfast, Schnier, Dolores Lory, MD, 81 mg at 02/21/21 7858   carbamazepine (TEGRETOL) tablet 200 mg, 200 mg, Oral, TID, Schnier, Dolores Lory, MD, 200 mg at 02/22/21 0057   Chlorhexidine Gluconate Cloth 2 % PADS 6 each, 6 each, Topical, Daily, Schnier, Dolores Lory, MD, 6 each at 02/21/21 0823   enoxaparin (LOVENOX) injection 40 mg, 40 mg, Subcutaneous, Q24H, Schnier, Dolores Lory, MD, 40 mg at 02/22/21 0057   hydrALAZINE (APRESOLINE) tablet 25 mg, 25 mg, Oral,  Q6H PRN, Delana Meyer, Dolores Lory, MD, 25 mg at 02/18/21 0932   insulin aspart (novoLOG) injection 0-20 Units, 0-20 Units, Subcutaneous, TID WC, Schnier, Dolores Lory, MD, 3 Units at 02/21/21 1638   insulin aspart (novoLOG) injection 0-5 Units, 0-5 Units, Subcutaneous, QHS, Schnier, Dolores Lory, MD, 2 Units at 02/16/21 2123   lisinopril (ZESTRIL) tablet 40 mg, 40 mg, Oral, Q breakfast, 40 mg at 02/21/21 3557 **AND** [DISCONTINUED] hydrochlorothiazide (HYDRODIURIL) tablet 12.5  mg, 12.5 mg, Oral, Q breakfast, Cox, Amy N, DO, 12.5 mg at 02/15/21 3220   LORazepam (ATIVAN) injection 2 mg, 2 mg, Intravenous, Q1H PRN, Schnier, Dolores Lory, MD   magnesium oxide (MAG-OX) tablet 400 mg, 400 mg, Oral, Daily, Wieting, Richard, MD, 400 mg at 02/21/21 2542   multivitamin with minerals tablet 1 tablet, 1 tablet, Oral, Daily, Schnier, Dolores Lory, MD, 1 tablet at 02/21/21 7062   nafcillin 12 g in sodium chloride 0.9 % 500 mL continuous infusion, 12 g, Intravenous, Q24H, Ravishankar, Jayashree, MD, Stopped at 02/21/21 2118   ondansetron (ZOFRAN) tablet 4 mg, 4 mg, Oral, Q6H PRN **OR** ondansetron (ZOFRAN) injection 4 mg, 4 mg, Intravenous, Q6H PRN, Schnier, Dolores Lory, MD   oxyCODONE (Oxy IR/ROXICODONE) immediate release tablet 5 mg, 5 mg, Oral, Q6H PRN, Schnier, Dolores Lory, MD, 5 mg at 02/22/21 0057   protein supplement (ENSURE MAX) liquid, 11 oz, Oral, BID, Schnier, Dolores Lory, MD, 11 oz at 02/21/21 2257   simvastatin (ZOCOR) tablet 20 mg, 20 mg, Oral, QPM, Schnier, Dolores Lory, MD, 20 mg at 02/21/21 1638   sodium chloride flush (NS) 0.9 % injection 10-40 mL, 10-40 mL, Intracatheter, Q12H, Schnier, Dolores Lory, MD, 10 mL at 02/22/21 0057   sodium chloride flush (NS) 0.9 % injection 10-40 mL, 10-40 mL, Intracatheter, PRN, Schnier, Dolores Lory, MD, 10 mL at 02/18/21 2045   Social History: Social History   Tobacco Use   Smoking status: Never   Smokeless tobacco: Never  Substance Use Topics   Alcohol use: No   Drug use: No    Family Medical History: Family History  Problem Relation Age of Onset   Diabetes Father    Heart disease Father    Heart disease Brother    Breast cancer Cousin        dx 53s    Physical Examination: Vitals:   02/21/21 2208 02/22/21 0428  BP: (!) 156/80 140/77  Pulse: (!) 104 (!) 106  Resp: 16 17  Temp: 99 F (37.2 C) 98.7 F (37.1 C)  SpO2: 99% 93%     General: Patient is well developed, well nourished, calm, collected, and in no apparent  distress.  Psychiatric: Patient is non-anxious.  Head:  Pupils equal, round, and reactive to light.  ENT:  Oral mucosa appears well hydrated.  Neck:   Supple.  Full range of motion.  Respiratory: Patient is breathing without any difficulty.  Extremities: No edema.  Vascular: Palpable pulses in dorsal pedal vessels.  Skin:   On exposed skin, there are no abnormal skin lesions.  NEUROLOGICAL:  General: In no acute distress.   Awake, alert, oriented to person, place, and time.  Pupils equal round and reactive to light.  Facial tone is symmetric.  Tongue protrusion is midline.  There is no pronator drift.   Strength: Side Biceps Triceps Deltoid Interossei Grip Wrist Ext. Wrist Flex.  R 5 5 5 5 5 5 5   L 5 5 5 5 5 5 5    Side Iliopsoas Quads Hamstring PF DF  EHL  R 5 5 5 5 5 5   L 5 5 5 5 5 5     Bilateral upper and lower extremity sensation is intact to light touch. Reflexes are 1+ and symmetric at the biceps, triceps, brachioradialis, patella and achilles. Hoffman's is absent.  Clonus is not present.    Gait untested.  Imaging: MRI L spine 02/22/21   IMPRESSION: 1. 12 mm rim enhancing collection, likely abscess, in the paraspinal soft tissues just inferior to the L1 transverse process, with surrounding inflammation tracking into the right neural foramen and along the right aspect of the thecal sac, with dural and epidural inflammation, causing moderate thecal sac narrowing and severe right neural foraminal narrowing at this level. No intraspinal abscess. 2. No evidence of discitis or osteomyelitis.     Electronically Signed   By: Merilyn Baba M.D.   On: 02/22/2021 04:21  I have personally reviewed the images and agree with the above interpretation.  Labs: CBC Latest Ref Rng & Units 02/22/2021 02/19/2021 02/18/2021  WBC 4.0 - 10.5 K/uL 6.6 7.7 9.2  Hemoglobin 12.0 - 15.0 g/dL 8.5(L) 8.0(L) 7.6(L)  Hematocrit 36.0 - 46.0 % 26.3(L) 24.6(L) 22.9(L)  Platelets 150 - 400  K/uL 417(H) 390 375       Assessment and Plan: Ms. Prestage is a pleasant 71 y.o. female with MSSA bacteremia with epidural abscess with relatively minor symptoms.  - agree with medical therapy guided by Dr. Delaine Lame - Would not recommend surgery for washout or biopsy given known organism and benign symptoms - Would recommend mobilization and PTOT - If she has a neurologic change, we will reconsider whether intervention is worthwhile.   Rashiya Lofland K. Izora Ribas MD, [place] Dept. of Neurosurgery

## 2021-02-22 NOTE — Progress Notes (Signed)
Patient IV site red, blotchy, and a little painful. IV team consulted to assess. Patient has very poor vasculature, so waiting to remove until they see.

## 2021-02-22 NOTE — Progress Notes (Signed)
Patient went to MRI where she had to have a new IV placed. While in MRI a new IV was inserted by the IV team. About 2 hours being back in her room she called out c/o right arm pain. The tourniquet was still on her arm. It was removed, skin assessed, capillary refill good. Sharion Settler notified.

## 2021-02-22 NOTE — Progress Notes (Signed)
ID No fever No pain right hip Underwent MRI of the lumbosacral spine and hip.   O/e looks well No discomfort  Patient Vitals for the past 24 hrs:  BP Temp Pulse Resp SpO2  02/22/21 1547 (!) 135/93 98.3 F (36.8 C) -- 16 100 %  02/22/21 1150 129/69 98.4 F (36.9 C) (!) 102 16 99 %  02/22/21 0843 (!) 141/81 97.7 F (36.5 C) 96 18 92 %  02/22/21 0428 140/77 98.7 F (37.1 C) (!) 106 17 93 %  02/21/21 2208 (!) 156/80 99 F (37.2 C) (!) 104 16 99 %    Port removed- site looks well Rt arm- previous IV site has some erythema Chest b/l air entry [id number] Abd soft CNS grossly non focal   Labs CBC Latest Ref Rng & Units 02/22/2021 02/19/2021 02/18/2021  WBC 4.0 - 10.5 K/uL 6.6 7.7 9.2  Hemoglobin 12.0 - 15.0 g/dL 8.5(L) 8.0(L) 7.6(L)  Hematocrit 36.0 - 46.0 % 26.3(L) 24.6(L) 22.9(L)  Platelets 150 - 400 K/uL 417(H) 390 375    CMP Latest Ref Rng & Units 02/22/2021 02/19/2021 02/18/2021  Glucose 70 - 99 mg/dL 154(H) 158(H) 197(H)  BUN 8 - 23 mg/dL 12 14 13   Creatinine 0.44 - 1.00 mg/dL 0.60 0.51 0.63  Sodium 135 - 145 mmol/L 130(L) 130(L) 128(L)  Potassium 3.5 - 5.1 mmol/L 4.2 4.3 4.2  Chloride 98 - 111 mmol/L 98 96(L) 94(L)  CO2 22 - 32 mmol/L 27 27 26   Calcium 8.9 - 10.3 mg/dL 8.4(L) 7.9(L) 7.9(L)  Total Protein 6.5 - 8.1 g/dL 7.2 6.5 6.0(L)  Total Bilirubin 0.3 - 1.2 mg/dL 0.3 0.2(L) 0.5  Alkaline Phos 38 - 126 U/L 75 63 67  AST 15 - 41 U/L 58(H) 89(H) 35  ALT 0 - 44 U/L 65(H) 68(H) 39    Micro 02/14/21 BC- MSSA 02/17/21 BC MSSA 02/18/21 Cath tip 2/2/BC 1 of 4 positive  Impression/recommendation MSSA bacteremia  due to infected port - high bioburden repeat blood culture from 1/30 was positive as well.  PORT removed 02/18/21- cath tip positive for MSSA  repeat blood culture from 2/2 is still positive TEE NO endocarditis  cefazolin changed to nafcillin on 02/21/2021. Concern that there is an unidentified deep focus MRI of the lumbosacral spine shows L1 paraspinal infection inferior  to the transverse portions with surrounding inflammation tracking to the right neural foramina and along the right aspect of the thecal sac with dural and epidural inflammation causing moderate thecal sac narrowing and severe right neural foraminal narrowing.  No evidence of discitis or osteomyelitis. Some amount of muscle edema in the right obturator muscle at the insertion of the ischial tuberosity with enhancement on the postcontrast images. Patient was discussed with neurosurgeon and no role for surgery currently. No abscess big enough for anything kind of aspiration even by IR. Cannot place PICC unless we get a neg blood culture  Rt hip pain-resolved  no septic arthritis -possible obturator internus bursitis  Anemia  Hyponatremia  Hypoalbuminemia   Ca breast on chemo  Discussed the management with patient and care team

## 2021-02-22 NOTE — Progress Notes (Signed)
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  Imaging of the hip including MRI was consistent with muscle strain and likely bursitis.    Patient was started on empiric antibiotics on admission due to leukocytosis, tachycardia and concern for possible UTI and/or pneumonia.  Blood cultures subsequently positive for MSSA.    Admitted with sepsis due to MSSA bacteremia.  Patient again with positive blood cultures on 02/20/2021.  Antibiotic switched over to nafcillin.  Small abscess seen on MRI of the lumbar spine.  Too small to drain at this point.  Assessment and Plan: * MSSA bacteremia- (present on admission) Unfortunately she has another positive blood culture from the repeat cultures on 02/20/2021.  As per ID will repeat blood cultures on Monday.  Blood cultures will have to remain negative for 2 days prior to placing PICC line.  Will need 6 weeks of IV antibiotics.  MRI of the lumbar spine shows a small paraspinal abscess.  Neurosurgery recommended medical management.  Case also discussed with interventional radiology Dr. Pascal Lux and he said this is too small for drainage at this point in time.  Sepsis ([place])- (present on admission) Present on admission with MSSA bacteremia, leukocytosis and fever.  right Hip pain- (present on admission) Currently no pain in the right hip this has improved.  Severe obesity (BMI 35.0-35.9 with comorbidity)  (HCC) BMI 34.96   Seizure (HCC) Continue Tegretol.    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 Legal Guardian is Geraldo Pitter [phone].   Type 2 diabetes mellitus with hyperlipidemia (HCC) Last hemoglobin A1c 7.3.  Currently on sliding scale insulin.  Continue simvastatin.  Acute anemia Last hemoglobin 8.5.  Suspect this is secondary to chemotherapy.  Peripheral smear shows rouleaux formation which likely goes along with infection.  Iron studies consistent with anemia of chronic disease.  Hyponatremia- (present on admission) Sodium remains at 130.  Malignant neoplasm of left breast in female, estrogen receptor positive ([place]) Follows with Dr. Tasia Catchings as outpatient.  Port removed on 02/18/2021.  Hypomagnesemia- (present on admission) Continue replacement orally.   Hypokalemia- (present on admission) Resolved with replacement.  Hyperlipidemia- (present on admission) Continue Zocor        Subjective: Patient feels okay.  Offers no complaints.  No back pain.  Able to straight leg raise.  Physical Exam: Vitals:   02/21/21 2208 02/22/21 0428 02/22/21 0843 02/22/21 1150  BP: (!) 156/80 140/77 (!) 141/81 129/69  Pulse: (!) 104 (!) 106 96 (!) 102  Resp: 16 17 18 16   Temp: 99 F (37.2 C) 98.7 F (37.1 C) 97.7 F (36.5 C) 98.4 F (36.9 C)  TempSrc:      SpO2: 99% 93% 92% 99%  Weight:      Height:       Physical Exam HENT:     Head: Normocephalic.     Mouth/Throat:     Pharynx: No oropharyngeal exudate.  Eyes:  General: Lids are normal.     Conjunctiva/sclera: Conjunctivae normal.  Cardiovascular:     Rate and Rhythm: Normal rate and regular rhythm.     Heart sounds: Normal heart sounds, S1 normal and S2 normal.  Pulmonary:     Breath sounds: No decreased breath sounds, wheezing, rhonchi or rales.  Abdominal:     Palpations: Abdomen is soft.      Tenderness: There is no abdominal tenderness.  Musculoskeletal:     Right lower leg: Swelling present.     Left lower leg: Swelling present.     Right ankle: Swelling present.     Left ankle: Swelling present.     Comments: No pain to palpation over right hip  Skin:    General: Skin is warm.     Findings: No rash.  Neurological:     Mental Status: She is alert and oriented to person, place, and time.     Comments: Answer some simple questions appropriately.  Patient able to straight leg raise bilaterally.     Data Reviewed: MRIs from last night reviewed showing a small abscess in the lumbosacral spine.  Laboratory and radiological data reviewed from today.  Hemoglobin up at 8.5 and sodium still low at 130.  Family Communication: Left message for guardian  Disposition: Status is: Inpatient Remains inpatient appropriate because: Treating for persistent MSSA bacteremia  Planned Discharge Destination: Rehab Repeat blood cultures on Monday.  If those are negative for 2 days then are able to place a PICC line and get out to rehab.  Author: Loletha Grayer, MD 02/22/2021 1:33 PM  For on call review [email or web address].

## 2021-02-23 DIAGNOSIS — M4620 Osteomyelitis of vertebra, site unspecified: Secondary | ICD-10-CM

## 2021-02-23 DIAGNOSIS — F32A Depression, unspecified: Secondary | ICD-10-CM

## 2021-02-23 LAB — CULTURE, BLOOD (ROUTINE X 2)

## 2021-02-23 LAB — GLUCOSE, CAPILLARY
Glucose-Capillary: 171 mg/dL — ABNORMAL HIGH (ref 70–99)
Glucose-Capillary: 274 mg/dL — ABNORMAL HIGH (ref 70–99)
Glucose-Capillary: 180 mg/dL — ABNORMAL HIGH (ref 70–99)
Glucose-Capillary: 212 mg/dL — ABNORMAL HIGH (ref 70–99)

## 2021-02-23 MED ORDER — CITALOPRAM HYDROBROMIDE 10 MG PO TABS
10.0000 mg | ORAL_TABLET | Freq: Every day | ORAL | Status: DC
  Administered 2021-02-23 – 2021-02-28 (×6): 10 mg via ORAL
  Filled 2021-02-23 (×6): qty 1

## 2021-02-23 MED ORDER — METOPROLOL SUCCINATE ER 25 MG PO TB24
25.0000 mg | ORAL_TABLET | Freq: Every day | ORAL | Status: DC
  Administered 2021-02-23: 25 mg via ORAL
  Filled 2021-02-23: qty 1

## 2021-02-23 MED ORDER — INSULIN ASPART 100 UNIT/ML IJ SOLN
2.0000 [IU] | Freq: Three times a day (TID) | INTRAMUSCULAR | Status: DC
  Administered 2021-02-23 – 2021-02-24 (×3): 2 [IU] via SUBCUTANEOUS
  Filled 2021-02-23 (×3): qty 1

## 2021-02-23 NOTE — Assessment & Plan Note (Addendum)
As per legal guardian the patient has been very teary and upset that she has been in the hospital and wanted to me to start something for depression.  Continue low-dose Celexa.

## 2021-02-23 NOTE — Progress Notes (Signed)
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  Imaging of the hip including MRI was consistent with muscle strain and likely bursitis.    Patient was started on empiric antibiotics on admission due to leukocytosis, tachycardia and concern for possible UTI and/or pneumonia.  Blood cultures subsequently positive for MSSA.    Admitted with sepsis due to MSSA bacteremia.  Patient again with positive blood cultures on 02/20/2021.  Antibiotic switched over to nafcillin.  Small abscess seen on MRI of the lumbar spine.  Too small to drain at this point.  Assessment and Plan: * MSSA bacteremia- (present on admission) Unfortunately she has another positive blood culture from the repeat cultures on 02/20/2021.  TEE negative.  Port removed on 02/18/2021 and culture tip positive for MSSA.  As per ID will repeat blood cultures on 02/24/2021.  Blood cultures will have to remain negative for 2 days prior to placing PICC line.  Will need 6 weeks of IV antibiotics.  MRI of the lumbar spine shows a small paraspinal abscess.  Neurosurgery and interventional radiology recommended medical management at this point..  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  Continue to monitor and treat with antibiotics.  Sepsis ([place])- (present on admission) Present on admission with MSSA bacteremia, leukocytosis and fever.  Repeating blood cultures again  on 02/24/2021.  right Hip pain- (present on admission) Currently no pain in the right hip this has improved.  Severe obesity (BMI 35.0-35.9 with comorbidity) (HCC) BMI 34.96   Seizure (HCC) Continue Tegretol.    HTN (hypertension), benign- (present on admission) Continue lisinopril and Norvasc.  Add Toprol-XL at night with blood pressure still being a little high.  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 Legal Guardian is Geraldo Pitter [phone].   Depression As per legal guardian the patient has been very teary and upset that she has been in the hospital and wanted to me to start something for depression.  We will start low-dose Celexa.  Type 2 diabetes mellitus with hyperlipidemia (HCC) Last hemoglobin A1c 7.3.  Currently on sliding scale insulin.  Continue simvastatin.  Sugars trending a little higher we will add short acting insulin prior to meals plus a sliding scale at this point.  Acute anemia Last hemoglobin 8.5.  Suspect this is secondary to chemotherapy.  Peripheral smear shows rouleaux formation which likely goes along with infection.  Iron studies consistent with anemia of chronic disease.  Hyponatremia- (present on admission) Sodium remains at 130.  Malignant neoplasm of left breast in female, estrogen receptor positive ([place]) Follows with Dr. Tasia Catchings as outpatient.  Port removed on 02/18/2021.  Hypomagnesemia- (present on admission) Continue replacement orally.   Hypokalemia- (present on admission) Resolved with replacement.  Hyperlipidemia- (present on admission) Continue Zocor        Subjective: Patient feels okay and offers no complaints for me.  No complaints of back pain.  Admitted and found to have MSSA sepsis and bacteremia  Physical Exam:  Vitals:   02/22/21 2132 02/23/21 0355 02/23/21 0927 02/23/21 1142  BP: (!) 142/82 (!) 194/80 (!) 164/90 (!) 166/67  Pulse: (!) 107 (!) 109 (!) 109 (!) 103  Resp: 17 18 18 18   Temp: 98.1 F  (36.7 C) 98 F (36.7 C) 98.2 F (36.8 C) 98.2 F (36.8 C)  TempSrc: Oral     SpO2: 96% 93% 98% 96%  Weight:      Height:       Physical Exam HENT:     Head: Normocephalic.     Mouth/Throat:     Pharynx: No oropharyngeal exudate.  Eyes:     General: Lids are normal.     Conjunctiva/sclera: Conjunctivae normal.  Cardiovascular:     Rate and Rhythm: Normal rate and regular rhythm.     Heart sounds: Normal heart sounds, S1 normal and S2 normal.  Pulmonary:     Breath sounds: No decreased breath sounds, wheezing, rhonchi or rales.  Abdominal:     Palpations: Abdomen is soft.     Tenderness: There is no abdominal tenderness.  Musculoskeletal:     Right lower leg: Swelling present.     Left lower leg: Swelling present.     Right ankle: Swelling present.     Left ankle: Swelling present.     Comments: No pain to palpation over right hip  Skin:    General: Skin is warm.     Findings: No rash.  Neurological:     Mental Status: She is alert and oriented to person, place, and time.     Comments: Answer some simple questions appropriately.  Patient able to straight leg raise bilaterally.     Data Reviewed: Sugars trending higher we will add short acting insulin prior to meals also.  Family Communication: spoke with Jeannie  Disposition: Status is: Inpatient Remains inpatient appropriate because: With persistently positive blood cultures will have to have a longer hospital stay.  Planned Discharge Destination: Rehab  Author: Loletha Grayer, MD 02/23/2021 2:00 PM  For on call review [email or web address].

## 2021-02-23 NOTE — Assessment & Plan Note (Signed)
Too small at this point for any drainage.  Continue to monitor and treat with antibiotics.

## 2021-02-24 DIAGNOSIS — B9561 Methicillin susceptible Staphylococcus aureus infection as the cause of diseases classified elsewhere: Secondary | ICD-10-CM | POA: Diagnosis not present

## 2021-02-24 DIAGNOSIS — D638 Anemia in other chronic diseases classified elsewhere: Secondary | ICD-10-CM

## 2021-02-24 DIAGNOSIS — R7881 Bacteremia: Secondary | ICD-10-CM | POA: Diagnosis not present

## 2021-02-24 DIAGNOSIS — M4620 Osteomyelitis of vertebra, site unspecified: Secondary | ICD-10-CM | POA: Diagnosis not present

## 2021-02-24 LAB — CBC
HCT: 25.9 % — ABNORMAL LOW (ref 36.0–46.0)
Hemoglobin: 8.3 g/dL — ABNORMAL LOW (ref 12.0–15.0)
MCH: 30.4 pg (ref 26.0–34.0)
MCHC: 32 g/dL (ref 30.0–36.0)
MCV: 94.9 fL (ref 80.0–100.0)
Platelets: 411 10*3/uL — ABNORMAL HIGH (ref 150–400)
RBC: 2.73 MIL/uL — ABNORMAL LOW (ref 3.87–5.11)
RDW: 15.1 % (ref 11.5–15.5)
WBC: 6.4 10*3/uL (ref 4.0–10.5)
nRBC: 0 % (ref 0.0–0.2)

## 2021-02-24 LAB — BASIC METABOLIC PANEL
Anion gap: 9 (ref 5–15)
BUN: 18 mg/dL (ref 8–23)
CO2: 24 mmol/L (ref 22–32)
Calcium: 8.4 mg/dL — ABNORMAL LOW (ref 8.9–10.3)
Chloride: 100 mmol/L (ref 98–111)
Creatinine, Ser: 0.68 mg/dL (ref 0.44–1.00)
GFR, Estimated: >60 mL/min (ref >60)
Glucose, Bld: 187 mg/dL — ABNORMAL HIGH (ref 70–99)
Potassium: 4.3 mmol/L (ref 3.5–5.1)
Sodium: 133 mmol/L — ABNORMAL LOW (ref 135–145)

## 2021-02-24 LAB — GLUCOSE, CAPILLARY
Glucose-Capillary: 281 mg/dL — ABNORMAL HIGH (ref 70–99)
Glucose-Capillary: 200 mg/dL — ABNORMAL HIGH (ref 70–99)
Glucose-Capillary: 245 mg/dL — ABNORMAL HIGH (ref 70–99)
Glucose-Capillary: 198 mg/dL — ABNORMAL HIGH (ref 70–99)

## 2021-02-24 LAB — HEPATIC FUNCTION PANEL
ALT: 59 U/L — ABNORMAL HIGH (ref 0–44)
AST: 40 U/L (ref 15–41)
Albumin: 2.3 g/dL — ABNORMAL LOW (ref 3.5–5.0)
Alkaline Phosphatase: 66 U/L (ref 38–126)
Bilirubin, Direct: 0.1 mg/dL (ref 0.0–0.2)
Indirect Bilirubin: 0.4 mg/dL (ref 0.3–0.9)
Total Bilirubin: 0.5 mg/dL (ref 0.3–1.2)
Total Protein: 7.1 g/dL (ref 6.5–8.1)

## 2021-02-24 LAB — SEDIMENTATION RATE: Sed Rate: 126 mm/hr — ABNORMAL HIGH (ref 0–30)

## 2021-02-24 LAB — C-REACTIVE PROTEIN: CRP: 8.6 mg/dL — ABNORMAL HIGH (ref <1.0)

## 2021-02-24 MED ORDER — GLUCERNA SHAKE PO LIQD
237.0000 mL | Freq: Two times a day (BID) | ORAL | Status: DC
  Administered 2021-02-24 – 2021-02-28 (×9): 237 mL via ORAL

## 2021-02-24 MED ORDER — INSULIN ASPART 100 UNIT/ML IJ SOLN
4.0000 [IU] | Freq: Three times a day (TID) | INTRAMUSCULAR | Status: DC
  Administered 2021-02-24 – 2021-02-28 (×12): 4 [IU] via SUBCUTANEOUS
  Filled 2021-02-24 (×12): qty 1

## 2021-02-24 MED ORDER — METOPROLOL SUCCINATE ER 50 MG PO TB24
50.0000 mg | ORAL_TABLET | Freq: Every day | ORAL | Status: DC
  Administered 2021-02-24 – 2021-02-27 (×4): 50 mg via ORAL
  Filled 2021-02-24 (×4): qty 1

## 2021-02-24 NOTE — TOC Progression Note (Signed)
Transition of Care [facility]) - Progression Note    Patient Details  Name: Tamara Mejia MRN: [id number] Date of Birth: May 20, 1950  Transition of Care [facility]) CM/SW [place], RN Phone Number: 02/24/2021, 9:05 AM  Clinical Narrative:   Spoke with Geraldo Pitter, The patient's Legal Guardian, The plan is to go to [facility] mid week, the Legal guardian has treatment on Wed but has a secure voice mail, she said to transport when ready and if needed to call and leave  secure voice mail.          Expected Discharge Plan and Services                                                 Social Determinants of Health (SDOH) Interventions    Readmission Risk Interventions No flowsheet data found.

## 2021-02-24 NOTE — Progress Notes (Signed)
PT Cancellation Note  Patient Details Name: AZADEH HYDER MRN: [id number] DOB: 1950/01/23   Cancelled Treatment:    Reason Eval/Treat Not Completed: Other (comment) Pt reports she just got back to the chair with OT and does not want to work w/ PT at this time.    Jonnie Kind, SPT 02/24/2021, 10:25 AM

## 2021-02-24 NOTE — Progress Notes (Signed)
Inpatient Diabetes Program Recommendations  AACE/ADA: New Consensus Statement on Inpatient Glycemic Control   Target Ranges:  Prepandial:   less than 140 mg/dL      [place] postprandial:   less than 180 mg/dL (1-2 hours)      Critically ill patients:  140 - 180 mg/dL    Latest Reference Range & Units 02/23/21 07:40 02/23/21 11:40 02/23/21 16:25 02/23/21 20:41 02/24/21 07:36  Glucose-Capillary 70 - 99 mg/dL 171 (H) 274 (H) 180 (H) 212 (H) 281 (H)   Review of Glycemic Control  Diabetes history: DM2 Outpatient Diabetes medications: Metformin 500 mg BID Current orders for Inpatient glycemic control: Novolog 0-20 units TID with meals, Novolog 0-5 units QHS, Novolog 2 units TID with meals  Inpatient Diabetes Program Recommendations:    Insulin: If post prandial glucose remains consistently over 180 mg/dl, please consider increasing meal coverage to Novolog 4 units TID with meals if patient eats at least 50% of meals.  Thanks, Barnie Alderman, RN, MSN, CDE Diabetes Coordinator Inpatient Diabetes Program [phone] (Team Pager from 8am to 5pm)

## 2021-02-24 NOTE — Plan of Care (Signed)

## 2021-02-24 NOTE — Progress Notes (Addendum)
Physical Therapy Treatment Patient Details Name: Tamara Mejia MRN: [id number] DOB: [birth year] Today's Date: 02/24/2021   History of Present Illness Pt is a 71 y/o F admitted on 02/14/21 with c/c of R hip pain. Pt is admitted for work-up. MRI findings consistent with muscular strain and  associated obturator internus bursitis. Blood cultures positive for MSSA. PMH: L breast CA currently on chemo, childhood meningitis, mild<>moderate learning disability, cognitive dysfunction, tonic-clonic seziures, HTN, obesity, overactive bladder, DM2    PT Comments    Pt awake and alert, resting in recliner, upon PT entrance into room. She denies being in any pain currently and reports she needs to go to the bathroom. BP was recorded prior to transfers due to elevated readings prior, BP was obtained via RLE due to patient report of "they've been taking it on my leg" because of wrap on her right forearm. BP was 201/83 but unknown how accurate of a reading. Pt was able to perform sit to stand and transfer to [facility] w/ CGA using RW. She required verbal cueing for proper UE utilization for safety and effectiveness. Pt was able to perform pericare w/ unilateral and bilateral UE support while standing. BP was recorded again once Pt returned to recliner and was 206/120. Continuing w/ ambulation was deferred due to BP reading, nurse was notified following the completion of the session, who  reported that they had been taking it on her R arm and not on her leg. Pt will benefit from continued skilled PT in order to improve LE strength, mobility, gait, and restore PLOF. Current discharge recommendation remains appropriate due to the level of assistance required by the patient to ensure safety and improve overall function.    Recommendations for follow up therapy are one component of a multi-disciplinary discharge planning process, led by the attending physician.  Recommendations may be updated based on patient status, additional  functional criteria and insurance authorization.  Follow Up Recommendations  Acute inpatient rehab (3hours/day) (or SNF pending further progress.)     Assistance Recommended at Discharge Intermittent Supervision/Assistance  Patient can return home with the following A little help with walking and/or transfers;A little help with bathing/dressing/bathroom;Help with stairs or ramp for entrance;Direct supervision/assist for medications management;Assistance with cooking/housework;Direct supervision/assist for financial management;Assist for transportation   Equipment Recommendations  Rolling walker (2 wheels);BSC/3in1    Recommendations for Other Services       Precautions / Restrictions Precautions Precautions: Fall Restrictions Weight Bearing Restrictions: Yes RLE Weight Bearing: Weight bearing as tolerated     Mobility  Bed Mobility                    Transfers Overall transfer level: Needs assistance Equipment used: Rolling walker (2 wheels) Transfers: Sit to/from Stand Sit to Stand: Min guard   Step pivot transfers: Min guard       General transfer comment: VC for hand placement    Ambulation/Gait Ambulation/Gait assistance: Min guard Gait Distance (Feet): 8 Feet Assistive device: Rolling walker (2 wheels) Gait Pattern/deviations: Decreased step length - right, Decreased step length - left, Trunk flexed, Step-through pattern           Stairs             Wheelchair Mobility    Modified Rankin (Stroke Patients Only)       Balance Overall balance assessment: Needs assistance Sitting-balance support: No upper extremity supported, Feet supported Sitting balance-Leahy Scale: Good Sitting balance - Comments: intermittent BUE vs UE support on  RW handles while seated on BSC   Standing balance support: Single extremity supported, No upper extremity supported, During functional activity Standing balance-Leahy Scale: Fair Standing balance  comment: able to stand inside the RW with intermittent UE support on the counter for standing grooming tasks                            Cognition Arousal/Alertness: Awake/alert Behavior During Therapy: WFL for tasks assessed/performed Overall Cognitive Status: History of cognitive impairments - at baseline                                 General Comments: intermittent VC for safety/sequencing with RW        Exercises      General Comments        Pertinent Vitals/Pain Pain Assessment Pain Assessment: No/denies pain Pain Intervention(s): Limited activity within patient's tolerance, Monitored during session, Repositioned    Home Living                          Prior Function            PT Goals (current goals can now be found in the care plan section) Progress towards PT goals: Progressing toward goals    Frequency    Min 2X/week      PT Plan Current plan remains appropriate    Co-evaluation              AM-PAC PT "6 Clicks" Mobility   Outcome Measure  Help needed turning from your back to your side while in a flat bed without using bedrails?: A Little Help needed moving from lying on your back to sitting on the side of a flat bed without using bedrails?: A Little Help needed moving to and from a bed to a chair (including a wheelchair)?: A Little Help needed standing up from a chair using your arms (e.g., wheelchair or bedside chair)?: A Little Help needed to walk in hospital room?: A Little Help needed climbing 3-5 steps with a railing? : A Lot 6 Click Score: 17    End of Session Equipment Utilized During Treatment: Gait belt Activity Tolerance: Patient tolerated treatment well Patient left: in chair;with call bell/phone within reach Nurse Communication: Mobility status PT Visit Diagnosis: Muscle weakness (generalized) (M62.81);Difficulty in walking, not elsewhere classified (R26.2);Unsteadiness on feet  (R26.81) Pain - Right/Left: Right Pain - part of body: Hip     Time: 2979-8921 PT Time Calculation (min) (ACUTE ONLY): 19 min  Charges:                         Jonnie Kind, SPT 02/24/2021, 3:19 PM

## 2021-02-24 NOTE — Plan of Care (Signed)

## 2021-02-24 NOTE — Progress Notes (Signed)
ID Feeling good- sitting in chair No pain back or leg   O/e looks well No discomfort  Patient Vitals for the past 24 hrs:  BP Temp Temp src Pulse Resp SpO2  02/24/21 1510 (!) 178/83 98.5 F (36.9 C) -- 96 16 97 %  02/24/21 1115 (!) 176/74 98.7 F (37.1 C) Oral 100 16 99 %  02/24/21 1106 (!) 176/73 98.6 F (37 C) -- 98 16 98 %  02/24/21 0800 -- -- -- -- -- 94 %  02/24/21 0745 (!) 168/82 98.4 F (36.9 C) Oral (!) 102 16 --  02/24/21 0735 (!) 169/81 -- -- (!) 105 16 97 %  02/24/21 0637 (!) 178/72 -- -- (!) 106 18 96 %  02/24/21 0410 (!) 184/70 -- -- (!) 107 18 94 %    Port removed- site looks well Chest b/l air entry [id number] Abd soft CNS grossly non focal   Labs CBC Latest Ref Rng & Units 02/24/2021 02/22/2021 02/19/2021  WBC 4.0 - 10.5 K/uL 6.4 6.6 7.7  Hemoglobin 12.0 - 15.0 g/dL 8.3(L) 8.5(L) 8.0(L)  Hematocrit 36.0 - 46.0 % 25.9(L) 26.3(L) 24.6(L)  Platelets 150 - 400 K/uL 411(H) 417(H) 390    CMP Latest Ref Rng & Units 02/24/2021 02/22/2021 02/19/2021  Glucose 70 - 99 mg/dL 187(H) 154(H) 158(H)  BUN 8 - 23 mg/dL 18 12 14   Creatinine 0.44 - 1.00 mg/dL 0.68 0.60 0.51  Sodium 135 - 145 mmol/L 133(L) 130(L) 130(L)  Potassium 3.5 - 5.1 mmol/L 4.3 4.2 4.3  Chloride 98 - 111 mmol/L 100 98 96(L)  CO2 22 - 32 mmol/L 24 27 27   Calcium 8.9 - 10.3 mg/dL 8.4(L) 8.4(L) 7.9(L)  Total Protein 6.5 - 8.1 g/dL 7.1 7.2 6.5  Total Bilirubin 0.3 - 1.2 mg/dL 0.5 0.3 0.2(L)  Alkaline Phos 38 - 126 U/L 66 75 63  AST 15 - 41 U/L 40 58(H) 89(H)  ALT 0 - 44 U/L 59(H) 65(H) 68(H)    Micro 02/14/21 BC- MSSA 02/17/21 BC MSSA 02/18/21 Cath tip 2/2/BC 1 of 4 positive  Impression/recommendation MSSA bacteremia  due to infected port - high bioburden repeat blood culture from 1/30 was positive as well.  PORT removed 02/18/21- cath tip positive for MSSA  repeat blood culture from 2/2 is still positive TEE NO endocarditis  cefazolin changed to nafcillin on 02/21/2021. Will need minimum  6 weeks of IV  nafcillin  MRI of the lumbosacral spine shows L1 paraspinal infection inferior to the transverse portions with surrounding inflammation tracking to the right neural foramina and along the right aspect of the thecal sac with dural and epidural inflammation causing moderate thecal sac narrowing and severe right neural foraminal narrowing.  No evidence of discitis or osteomyelitis. Some amount of muscle edema in the right obturator muscle at the insertion of the ischial tuberosity with enhancement on the postcontrast images. Patient was discussed with neurosurgeon and no role for surgery currently. No abscess big enough for anything kind of aspiration even by IR.  Cannot place PICC unless we get a neg blood culture  Rt hip pain-resolved  no septic arthritis -possible obturator internus bursitis  Anemia  Hyponatremia  Hypoalbuminemia   Ca breast on chemo  Discussed the management with patient and care team

## 2021-02-24 NOTE — Progress Notes (Signed)
Nutrition Follow-up  DOCUMENTATION CODES:   Obesity unspecified  INTERVENTION:   -Continue MVI with minerals daily -D/c Ensure Max -Glucerna Shake po BID, each supplement provides 220 kcal and 10 grams of protein  -Liberalize diet to carb modified for increased variety of meal selections  NUTRITION DIAGNOSIS:   Increased nutrient needs related to cancer and cancer related treatments as evidenced by estimated needs.  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  1/31- pot-a-cath removed 2/1- s/p TEE- no evidence of endocarditis 2/4- MRI of lumbar spine revealed abscess (too small to drain)  Reviewed I/O's: +593 ml x 24 hours and -1.2 L since admission  UOP: 900 ml x 24 hours   Pt sitting up in recliner chair, talking on phone at time of visit. Observed breakfast tray- pt consumed 100% of tray in addition to Ensure supplement. Documented meal completions 90-100%.   Per chart review, plan for 4 weeks of antibiotics. Chemo will be held during this time.   Per TOC notes, plan to d/c to SNF later this week.   Labs reviewed: Na: 133, CBGS: 171-281 (inpatient orders for glycemic control are 0-20 units insulin aspart TID with meals and 0-5 units insulin aspart daily at bedtime).    Diet Order:   Diet Order             Diet heart healthy/carb modified Room service appropriate? Yes; Fluid consistency: Thin  Diet effective now                    EDUCATION NEEDS:   Education needs have been addressed  Skin:  Skin Assessment: Reviewed RN Assessment  Last BM:  02/23/21  Height:   Ht Readings from Last 1 Encounters:  02/14/21 5\' 1"  (1.549 m)    Weight:   Wt Readings from Last 1 Encounters:  02/14/21 83.9 kg   BMI:  Body mass index is 34.96 kg/m.  Estimated Nutritional Needs:   Kcal:  1900-2100  Protein:  95-110 grams  Fluid:  >1.9 L    Loistine Chance, RD, LDN, [place] Registered Dietitian II Certified Diabetes Care and Education Specialist Please refer to [facility] for RD and/or RD on-call/weekend/after hours pager

## 2021-02-24 NOTE — Progress Notes (Signed)
Occupational Therapy Treatment Patient Details Name: Tamara Mejia MRN: [id number] DOB: 03-24-1950 Today's Date: 02/24/2021   History of present illness Pt is a 71 y/o F admitted on 02/14/21 with c/c of R hip pain. Pt is admitted for work-up. MRI findings consistent with muscular strain and  associated obturator internus bursitis. Blood cultures positive for MSSA. 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 Pt received in the recliner, caregivers present for part of session. Pt initially endorses 8/10 low back pain but with more simplified pain scale (small/med/large), pt endorses small amount of pain in low back. Pt performed STS transfer from the recliner with VC for hand placement and CGA. Pt negotiated obstacles in the room with RW and intermittent VC for avoiding bumping the RW into end of bed vs IV pole vs tray table. Pt stood at the sink for grooming tasks with intermittent VC for sequencing, locating items on counter, and thoroughness of task to wash her face, hands, brush teeth, and clean her glasses. Pt tolerated well. Left in recliner with PT for additional therapy. Pt progressing towards goals, continues to benefit from skilled OT services.    Recommendations for follow up therapy are one component of a multi-disciplinary discharge planning process, led by the attending physician.  Recommendations may be updated based on patient status, additional functional criteria and insurance authorization.    Follow Up Recommendations  Acute inpatient rehab (3hours/day)    Assistance Recommended at Discharge Frequent or constant Supervision/Assistance  Patient can return home with the following  A lot of help with bathing/dressing/bathroom;Direct supervision/assist for medications management;Assist for  transportation;Assistance with cooking/housework;Help with stairs or ramp for entrance;A little help with walking and/or transfers   Equipment Recommendations  BSC/3in1;Other (comment) (reacher, LH sponge)    Recommendations for Other Services      Precautions / Restrictions Precautions Precautions: Fall Restrictions Weight Bearing Restrictions: Yes RLE Weight Bearing: Weight bearing as tolerated LLE Weight Bearing: Weight bearing as tolerated       Mobility Bed Mobility               General bed mobility comments: NT, up in recliner at start and end of session    Transfers Overall transfer level: Needs assistance Equipment used: Rolling walker (2 wheels) Transfers: Sit to/from Stand Sit to Stand: Min guard           General transfer comment: VC for hand placement     Balance Overall balance assessment: Needs assistance Sitting-balance support: No upper extremity supported, Feet supported Sitting balance-Leahy Scale: Good     Standing balance support: Single extremity supported, No upper extremity supported, During functional activity   Standing balance comment: able to stand inside the RW with intermittent UE support on the counter for standing groomingt asks                           ADL either performed or assessed with clinical judgement   ADL Overall ADL's : Needs assistance/impaired     Grooming: Standing;Min guard;Oral care;Wash/dry face;Wash/dry hands Grooming Details (indicate cue type and reason): PRN VC for sequencing                             Functional mobility during ADLs: Min guard;Cueing for safety;Rolling  walker (2 wheels)      Extremity/Trunk Assessment              Vision       Perception     Praxis      Cognition Arousal/Alertness: Awake/alert Behavior During Therapy: WFL for tasks assessed/performed Overall Cognitive Status: History of cognitive impairments - at baseline                                  General Comments: intermittent VC for safety/sequencing with RW, incr processing time        Exercises      Shoulder Instructions       General Comments      Pertinent Vitals/ Pain       Pain Assessment Pain Assessment: No/denies pain Pain Location: pt initially reports 8/10 low back pain but when phrased as small/medium/large amount, she reports small amount Pain Intervention(s): Limited activity within patient's tolerance, Monitored during session, Repositioned, Patient requesting pain meds-RN notified  Home Living                                          Prior Functioning/Environment              Frequency  Min 3X/week        Progress Toward Goals  OT Goals(current goals can now be found in the care plan section)  Progress towards OT goals: Progressing toward goals  Acute Rehab OT Goals Patient Stated Goal: go home OT Goal Formulation: With patient Time For Goal Achievement: 03/04/21 Potential to Achieve Goals: Good  Plan Discharge plan remains appropriate;Frequency remains appropriate    Co-evaluation                 AM-PAC OT "6 Clicks" Daily Activity     Outcome Measure   Help from another person eating meals?: None Help from another person taking care of personal grooming?: A Little Help from another person toileting, which includes using toliet, bedpan, or urinal?: A Lot Help from another person bathing (including washing, rinsing, drying)?: A Lot Help from another person to put on and taking off regular upper body clothing?: A Little Help from another person to put on and taking off regular lower body clothing?: A Lot 6 Click Score: 16    End of Session Equipment Utilized During Treatment: Rolling walker (2 wheels);Gait belt  OT Visit Diagnosis: Other abnormalities of gait and mobility (R26.89) Pain - Right/Left: Right Pain - part of body: Hip (and low back)   Activity Tolerance Patient  tolerated treatment well   Patient Left in chair;with call bell/phone within reach;Other (comment) (with PT)   Nurse Communication Patient requests pain meds        Time: 5102-5852 OT Time Calculation (min): 15 min  Charges: OT General Charges $OT Visit: 1 Visit OT Treatments $Self Care/Home Management : 8-22 mins  Ardeth Perfect., MPH, MS, OTR/L ascom [phone] 02/24/21, 10:34 AM   Sharlene Motts Zadiel Leyh 02/24/2021, 10:31 AM

## 2021-02-24 NOTE — Progress Notes (Signed)
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  Imaging of the hip including MRI was consistent with muscle strain and likely bursitis.    Patient was started on empiric antibiotics on admission due to leukocytosis, tachycardia and concern for possible UTI and/or pneumonia.  Blood cultures subsequently positive for MSSA.    Admitted with sepsis due to MSSA bacteremia.  Patient again with positive blood cultures on 02/20/2021.  Antibiotic switched over to nafcillin.  Small abscess seen on MRI of the lumbar spine.  Too small to drain at this point.  Repeat blood cultures drawn on 02/24/2021  Assessment and Plan: * MSSA bacteremia- (present on admission) Currently on continuous nafcillin.  Unfortunately she has another positive blood culture from the repeat cultures on 02/20/2021.  TEE negative.  Port removed on 02/18/2021 and culture tip positive for MSSA.  Repeat blood cultures drawn this morning on 02/24/2021.  Blood cultures will have to remain negative for 2 days prior to placing PICC line.  Will need 6 weeks of IV antibiotics.  MRI of the lumbar spine shows a small paraspinal abscess.  Neurosurgery and interventional radiology recommended medical management at this point..  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  Continue to monitor and treat with antibiotics.  Sepsis ([place])- (present on admission) Present on  admission with MSSA bacteremia, leukocytosis and fever.  Repeating blood cultures again on 02/24/2021.  right Hip pain- (present on admission) Currently no pain in the right hip this has improved.  Severe obesity (BMI 35.0-35.9 with comorbidity) (HCC) BMI 34.96   Seizure (HCC) Continue Tegretol.    Essential hypertension Continue lisinopril and Norvasc.  Increase Toprol-XL to 50 mg at night with blood pressure still being a little high.  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 Legal Guardian is Geraldo Pitter [phone].   Depression As per legal guardian the patient has been very teary and upset that she has been in the hospital and wanted to me to start something for depression.  We will start low-dose Celexa.  Type 2 diabetes mellitus with hyperlipidemia (HCC) Last hemoglobin A1c 7.3.  Currently on sliding scale insulin.  Continue simvastatin.  Sugars trending a little higher we will increase short acting insulin to 4 units prior to meals plus a sliding scale at this point.  Acute anemia Last hemoglobin 8.6.  Suspect this is secondary to chemotherapy.  Peripheral smear shows rouleaux formation which likely goes along with infection.  Iron studies consistent with anemia of chronic disease.  Hyponatremia- (present on admission) Sodium 133 on  02/24/2021  Malignant neoplasm of left breast in female, estrogen receptor positive ([place]) Follows with Dr. Tasia Catchings as outpatient.  Port removed on 02/18/2021.  Hypomagnesemia- (present on admission) Continue replacement orally.   Hypokalemia- (present on admission) Resolved with replacement.  Hyperlipidemia- (present on admission) Continue Zocor     Subjective: Patient does complain of a little low back pain.  Otherwise feels okay.  No complaints.  Initially admitted with right hip pain and found to have MSSA bacteremia.  Physical Exam: Vitals:   02/24/21 0745 02/24/21 0800 02/24/21 1106 02/24/21 1115  BP: (!)  168/82  (!) 176/73 (!) 176/74  Pulse: (!) 102  98 100  Resp: 16  16 16   Temp: 98.4 F (36.9 C)  98.6 F (37 C) 98.7 F (37.1 C)  TempSrc: Oral   Oral  SpO2:  94% 98% 99%  Weight:      Height:       Physical Exam HENT:     Head: Normocephalic.     Mouth/Throat:     Pharynx: No oropharyngeal exudate.  Eyes:     General: Lids are normal.     Conjunctiva/sclera: Conjunctivae normal.  Cardiovascular:     Rate and Rhythm: Normal rate and regular rhythm.     Heart sounds: Normal heart sounds, S1 normal and S2 normal.  Pulmonary:     Breath sounds: No decreased breath sounds, wheezing, rhonchi or rales.  Abdominal:     Palpations: Abdomen is soft.     Tenderness: There is no abdominal tenderness.  Musculoskeletal:     Right lower leg: Swelling present.     Left lower leg: Swelling present.     Right ankle: Swelling present.     Left ankle: Swelling present.     Comments: No pain to palpation over right hip  Skin:    General: Skin is warm.     Findings: No rash.  Neurological:     Mental Status: She is alert and oriented to person, place, and time.     Comments: Answer some simple questions appropriately.  Patient able to straight leg raise bilaterally.     Data Reviewed: Laboratory data reviewed from today.  CRP elevated 8.6, sedimentation rate elevated at 126, hemoglobin 8.3, platelet count 411, white blood cell count normal range at 6.4, ALT slightly elevated 59, sodium 133  Family Communication: Spoke with patient's guardian on the phone  Disposition: Status is: Inpatient Remains inpatient appropriate because: Being treated for persistent MSSA bacteremia.  Need to make sure cultures drawn today negative for 2 days prior to PICC line being placed  Planned Discharge Destination: Rehab  Author: Loletha Grayer, MD 02/24/2021 2:50 PM  For on call review [email or web address].

## 2021-02-25 DIAGNOSIS — R7881 Bacteremia: Secondary | ICD-10-CM | POA: Diagnosis not present

## 2021-02-25 DIAGNOSIS — B9561 Methicillin susceptible Staphylococcus aureus infection as the cause of diseases classified elsewhere: Secondary | ICD-10-CM | POA: Diagnosis not present

## 2021-02-25 LAB — GLUCOSE, CAPILLARY
Glucose-Capillary: 144 mg/dL — ABNORMAL HIGH (ref 70–99)
Glucose-Capillary: 259 mg/dL — ABNORMAL HIGH (ref 70–99)
Glucose-Capillary: 209 mg/dL — ABNORMAL HIGH (ref 70–99)
Glucose-Capillary: 144 mg/dL — ABNORMAL HIGH (ref 70–99)

## 2021-02-25 NOTE — Progress Notes (Signed)
Notified on call provider in regards to patient's temp of 100.6. No new orders received at this time. Will continue to follow current plan of care.

## 2021-02-25 NOTE — Progress Notes (Signed)
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  Imaging of the hip including MRI was consistent with muscle strain and likely bursitis.    Patient was started on empiric antibiotics on admission due to leukocytosis, tachycardia and concern for possible UTI and/or pneumonia.  Blood cultures subsequently positive for MSSA.    Port removed on 02/18/2021.  Admitted with sepsis due to MSSA bacteremia.  Patient again with positive blood cultures on 02/20/2021.  Antibiotic switched over to nafcillin.  Small abscess seen on MRI of the lumbar spine on 02/22/2021.  Too small to drain at this point.  Repeat blood cultures drawn on 02/24/2021.  If negative for 2 days can have PICC line placed.  Assessment and Plan: * MSSA bacteremia- (present on admission) Currently on continuous nafcillin.  Unfortunately she has another positive blood culture from the repeat cultures on 02/20/2021.  TEE negative.  Port removed on 02/18/2021 and culture tip positive for MSSA.  Repeat blood cultures drawn this morning on 02/24/2021.  Blood cultures will have to remain negative for 2 days prior to placing PICC line.  Will need 6 weeks of IV antibiotics.  MRI of the lumbar spine shows a small paraspinal abscess.  Neurosurgery and interventional radiology recommended medical management at this point..  Transitional care team to check with rehab facility on whether they can do a  continuous nafcillin drip which would probably be easier than every 4 dosing.  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  Continue to monitor and treat with antibiotics.  Sepsis ([place])- (present on admission) Present on admission with MSSA bacteremia, leukocytosis and fever.  Repeating blood cultures again on 02/24/2021.  right Hip pain- (present on admission) Currently no pain in the right hip this has improved.  Severe obesity (BMI 35.0-35.9 with comorbidity) (HCC) BMI 34.96   Seizure (HCC) Continue Tegretol.    Essential hypertension Continue lisinopril and Norvasc.  Continue increased dose Toprol-XL to 50 mg at night with blood pressure still being a little high.  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 Legal Guardian is Geraldo Pitter [phone].   Depression As per legal guardian the patient has been very teary and upset that she has been in the hospital and wanted to me to start something for depression.  Continue low-dose Celexa.  Type 2 diabetes mellitus with hyperlipidemia (HCC) Last hemoglobin A1c 7.3.  Currently on sliding scale insulin.  Continue simvastatin.  Sugars trending a little higher we will increase short acting insulin to 4 units prior to meals plus a sliding scale at this point.  Anemia of chronic disease Last hemoglobin 8.6.  Suspect this is secondary to chemotherapy.  Peripheral smear shows rouleaux formation which likely goes along with infection.  Iron studies consistent with anemia of chronic disease.  Hyponatremia- (present on admission) Sodium 133 on  02/24/2021  Malignant neoplasm of left breast in female, estrogen receptor positive ([place]) Follows with Dr. Tasia Catchings as outpatient.  Port removed on  02/18/2021.  Hypomagnesemia- (present on admission) Continue replacement orally.   Hypokalemia- (present on admission) Resolved with replacement.  Hyperlipidemia- (present on admission) Continue  Zocor        Subjective: Patient feels well and offers no complaints.  No back pain today.  Initially admitted 10 days ago with right hip pain and found to have MSSA bacteremia.  Physical Exam: Vitals:   02/25/21 0422 02/25/21 0606 02/25/21 0737 02/25/21 1103  BP: (!) 193/82 (!) 187/72 (!) 187/88 (!) 161/69  Pulse: 89 89 91 88  Resp: 17  16 16   Temp: 97.9 F (36.6 C)  98.3 F (36.8 C) 99.1 F (37.3 C)  TempSrc:      SpO2: 97%  97% 97%  Weight:      Height:       Physical Exam HENT:     Head: Normocephalic.     Mouth/Throat:     Pharynx: No oropharyngeal exudate.  Eyes:     General: Lids are normal.     Conjunctiva/sclera: Conjunctivae normal.  Cardiovascular:     Rate and Rhythm: Normal rate and regular rhythm.     Heart sounds: Normal heart sounds, S1 normal and S2 normal.  Pulmonary:     Breath sounds: No decreased breath sounds, wheezing, rhonchi or rales.  Abdominal:     Palpations: Abdomen is soft.     Tenderness: There is no abdominal tenderness.  Musculoskeletal:     Right lower leg: Swelling present.     Left lower leg: Swelling present.     Right ankle: Swelling present.     Left ankle: Swelling present.     Comments: No pain to palpation over right hip  Skin:    General: Skin is warm.     Findings: No rash.  Neurological:     Mental Status: She is alert and oriented to person, place, and time.     Comments: Answer some simple questions appropriately.  Patient able to straight leg raise bilaterally.     Data Reviewed: Blood cultures drawn from 02/24/2021 still show negative for less than 12 hours.  Awaiting for microbiology to update for today.  Family Communication: Spoke with guardian on the phone  Disposition: Status is: Inpatient Remains inpatient appropriate because: Need to make sure blood cultures are negative prior to PICC line placement and discharged to rehab  Planned Discharge Destination: Rehab  Author: Loletha Grayer,  MD 02/25/2021 2:59 PM  For on call review [email or web address].

## 2021-02-25 NOTE — Progress Notes (Signed)
ID Pt in bed Resting comfortably No new complaints Had a remp of 100.6 , but she did not feel it  O/e looks well No discomfort  Patient Vitals for the past 24 hrs:  BP Temp Temp src Pulse Resp SpO2  02/25/21 2119 -- (!) 97.4 F (36.3 C) Oral -- -- --  02/25/21 2002 138/61 (!) 100.6 F (38.1 C) Oral 94 16 99 %  02/25/21 1732 (!) 142/66 -- -- 95 15 97 %  02/25/21 1525 (!) 193/88 98.2 F (36.8 C) -- 98 16 99 %  02/25/21 1103 (!) 161/69 99.1 F (37.3 C) -- 88 16 97 %  02/25/21 0737 (!) 187/88 98.3 F (36.8 C) -- 91 16 97 %  02/25/21 0606 (!) 187/72 -- -- 89 -- --  02/25/21 0422 (!) 193/82 97.9 F (36.6 C) -- 89 17 97 %    Port removed- site looks well Chest b/l air entry [id number] Abd soft CNS grossly non focal Rt arm- site of previous IV -mild erythema   Labs CBC Latest Ref Rng & Units 02/24/2021 02/22/2021 02/19/2021  WBC 4.0 - 10.5 K/uL 6.4 6.6 7.7  Hemoglobin 12.0 - 15.0 g/dL 8.3(L) 8.5(L) 8.0(L)  Hematocrit 36.0 - 46.0 % 25.9(L) 26.3(L) 24.6(L)  Platelets 150 - 400 K/uL 411(H) 417(H) 390    CMP Latest Ref Rng & Units 02/24/2021 02/22/2021 02/19/2021  Glucose 70 - 99 mg/dL 187(H) 154(H) 158(H)  BUN 8 - 23 mg/dL 18 12 14   Creatinine 0.44 - 1.00 mg/dL 0.68 0.60 0.51  Sodium 135 - 145 mmol/L 133(L) 130(L) 130(L)  Potassium 3.5 - 5.1 mmol/L 4.3 4.2 4.3  Chloride 98 - 111 mmol/L 100 98 96(L)  CO2 22 - 32 mmol/L 24 27 27   Calcium 8.9 - 10.3 mg/dL 8.4(L) 8.4(L) 7.9(L)  Total Protein 6.5 - 8.1 g/dL 7.1 7.2 6.5  Total Bilirubin 0.3 - 1.2 mg/dL 0.5 0.3 0.2(L)  Alkaline Phos 38 - 126 U/L 66 75 63  AST 15 - 41 U/L 40 58(H) 89(H)  ALT 0 - 44 U/L 59(H) 65(H) 68(H)    Micro 02/14/21 BC- MSSA 02/17/21 BC MSSA 02/18/21 Cath tip 2/2/BC 1 of 4 positive  Impression/recommendation MSSA bacteremia  due to infected port - high bioburden repeat blood culture from 1/30 was positive as well.  PORT removed 02/18/21- cath tip positive for MSSA  repeat blood culture from 2/2 is still positive TEE  NO endocarditis  cefazolin changed to nafcillin on 02/21/2021. Will need minimum  6 weeks of IV nafcillin Need to make sure that SNF/[facility] can dose her properly - 2 options- q4hr dosing or 24hr continuous infusion   MRI of the lumbosacral spine shows L1 paraspinal infection inferior to the transverse portions with surrounding inflammation tracking to the right neural foramina and along the right aspect of the thecal sac with dural and epidural inflammation causing moderate thecal sac narrowing and severe right neural foraminal narrowing.  No evidence of discitis or osteomyelitis. Some amount of muscle edema in the right obturator muscle at the insertion of the ischial tuberosity with enhancement on the postcontrast images. Patient was discussed with neurosurgeon and no role for surgery currently. No abscess big enough for anything kind of aspiration even by IR.  Cannot place PICC unless we get a neg blood culture  Rt hip pain-resolved  no septic arthritis -possible obturator internus bursitis  Anemia  Hyponatremia  Hypoalbuminemia   Ca breast on chemo  Discussed the management with patient and care team

## 2021-02-26 ENCOUNTER — Inpatient Hospital Stay: Payer: Self-pay

## 2021-02-26 DIAGNOSIS — B9561 Methicillin susceptible Staphylococcus aureus infection as the cause of diseases classified elsewhere: Secondary | ICD-10-CM | POA: Diagnosis not present

## 2021-02-26 DIAGNOSIS — M4626 Osteomyelitis of vertebra, lumbar region: Secondary | ICD-10-CM | POA: Diagnosis not present

## 2021-02-26 DIAGNOSIS — R7881 Bacteremia: Secondary | ICD-10-CM | POA: Diagnosis not present

## 2021-02-26 LAB — CBC WITH DIFFERENTIAL/PLATELET
Abs Immature Granulocytes: 0.04 10*3/uL (ref 0.00–0.07)
Basophils Absolute: 0.1 10*3/uL (ref 0.0–0.1)
Basophils Relative: 1 %
Eosinophils Absolute: 0.2 10*3/uL (ref 0.0–0.5)
Eosinophils Relative: 3 %
HCT: 26.8 % — ABNORMAL LOW (ref 36.0–46.0)
Hemoglobin: 8.5 g/dL — ABNORMAL LOW (ref 12.0–15.0)
Immature Granulocytes: 1 %
Lymphocytes Relative: 13 %
Lymphs Abs: 0.7 10*3/uL (ref 0.7–4.0)
MCH: 30.4 pg (ref 26.0–34.0)
MCHC: 31.7 g/dL (ref 30.0–36.0)
MCV: 95.7 fL (ref 80.0–100.0)
Monocytes Absolute: 0.3 10*3/uL (ref 0.1–1.0)
Monocytes Relative: 6 %
Neutro Abs: 4.3 10*3/uL (ref 1.7–7.7)
Neutrophils Relative %: 76 %
Platelets: 391 10*3/uL (ref 150–400)
RBC: 2.8 MIL/uL — ABNORMAL LOW (ref 3.87–5.11)
RDW: 15.6 % — ABNORMAL HIGH (ref 11.5–15.5)
WBC: 5.6 10*3/uL (ref 4.0–10.5)
nRBC: 0 % (ref 0.0–0.2)

## 2021-02-26 LAB — COMPREHENSIVE METABOLIC PANEL
ALT: 33 U/L (ref 0–44)
AST: 19 U/L (ref 15–41)
Albumin: 2.1 g/dL — ABNORMAL LOW (ref 3.5–5.0)
Alkaline Phosphatase: 66 U/L (ref 38–126)
Anion gap: 9 (ref 5–15)
BUN: 16 mg/dL (ref 8–23)
CO2: 27 mmol/L (ref 22–32)
Calcium: 8.3 mg/dL — ABNORMAL LOW (ref 8.9–10.3)
Chloride: 98 mmol/L (ref 98–111)
Creatinine, Ser: 0.58 mg/dL (ref 0.44–1.00)
GFR, Estimated: >60 mL/min (ref >60)
Glucose, Bld: 220 mg/dL — ABNORMAL HIGH (ref 70–99)
Potassium: 4 mmol/L (ref 3.5–5.1)
Sodium: 134 mmol/L — ABNORMAL LOW (ref 135–145)
Total Bilirubin: 0.6 mg/dL (ref 0.3–1.2)
Total Protein: 6.9 g/dL (ref 6.5–8.1)

## 2021-02-26 LAB — GLUCOSE, CAPILLARY
Glucose-Capillary: 162 mg/dL — ABNORMAL HIGH (ref 70–99)
Glucose-Capillary: 213 mg/dL — ABNORMAL HIGH (ref 70–99)
Glucose-Capillary: 126 mg/dL — ABNORMAL HIGH (ref 70–99)
Glucose-Capillary: 191 mg/dL — ABNORMAL HIGH (ref 70–99)
Glucose-Capillary: 142 mg/dL — ABNORMAL HIGH (ref 70–99)

## 2021-02-26 NOTE — Progress Notes (Signed)
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 Continue on IV nafcillin. Repeat blood cx growing MSSA still. Port removed 02/18/21 & cx tip positive for MSSA. Will need 6 weeks of IV antibiotics. PICC line ordered today.   Paraspinal abscess: too small at this point for any drainage. Medical management currently as per neuro surg& IR.,   Sepsis: present on admission. Met criteria w/ leukocytosis, fever & MSSA bacteremia. Resolved    Right Hip pain: resolved   Obesity: BMI 34.9. Complicates overall care & prognosis    Seizure: continue on home dose of tegretol   HTN: continue on lisinopril, amlodipine, metoprolol    Intellectual disability/delay: live is a group home.   Depression: severity unknown. Started on celexa this admission    DM2: fairly well contolled, HbA1c 7.3. Continue on aspart, SSI w/ accuchecks    Anemia of chronic disease: likely secondary to chemo. H&H are labile   Hyponatremia: trending up from day prior    Breast cancer: management as per onco outpatient, Dr. Tasia Catchings. Hold chemo until IV abxs are complete    Hypomagnesemia: will monitor intermittently    Hypokalemia: WNL today    HLD: continue on statin    DVT prophylaxis: lovenox  Code Status: full  Family Communication:  Disposition Plan: likely d/c to LTAC tomorrow  Level of care: Med-Surg  Status is:  Inpatient Remains inpatient appropriate because: likely d/c to LTAC tomorrow    Consultants:  ID Neuro surg   Procedures:   Antimicrobials:   Nafcillin   Subjective: Pt c/o fatigue   Objective: Vitals:   02/26/21 0407 02/26/21 0748 02/26/21 1120 02/26/21 1540  BP: (!) 151/71 (!) 149/80 (!) 119/56 131/68  Pulse: 86 94 90 88  Resp: _0 Temp: 98.6 F (37 C) 98.6 F (37 C) 98.3 F (36.8 C) 98.3 F (36.8 C)  TempSrc: Oral Oral Oral   SpO2: 98% 97% 98% 99%  Weight:      Height:        Intake/Output Summary (Last 24 hours) at 02/26/2021 1607 Last data filed at 02/26/2021 1402 Gross per 24 hour  Intake 240 ml  Output 1300 ml  Net -1060 ml   Filed Weights   02/14/21 1430  Weight: 83.9 kg    Examination:  General exam: Appears calm and comfortable  Respiratory system: Clear to auscultation. Respiratory effort normal. Cardiovascular system: S1 & S2 +. No rubs, gallops or clicks. Gastrointestinal system: Abdomen is nondistended, soft and nontender.  Normal bowel sounds heard. Central nervous system: Alert and oriented. Moves all extremities  Psychiatry: Judgement and insight appear poor. Flat mood and affect     Data Reviewed: I have personally reviewed following labs and imaging studies  CBC: Recent Labs  Lab 02/22/21 0425 02/24/21 0430 02/26/21 1119  WBC 6.6 6.4 5.6  NEUTROABS  --   --  4.3  HGB 8.5* 8.3* 8.5*  HCT 26.3* 25.9* 26.8*  MCV 93.3 94.9 95.7  PLT 417* 411* 882   Basic Metabolic Panel: Recent Labs  Lab 02/22/21 0425 02/24/21 0430 02/26/21 1119  NA 130* 133* 134*  K 4.2 4.3 4.0  CL 98 100 98  CO2 _0 GLUCOSE 154* 187* 220*  BUN _1 CREATININE 0.60 0.68 0.58  CALCIUM 8.4* 8.4* 8.3*   GFR: Estimated Creatinine Clearance: 66.1 mL/min (by C-G formula based on SCr of 0.58 mg/dL). Liver Function Tests: Recent Labs  Lab 02/22/21 0425 02/24/21 0430 02/26/21 1119  AST 58* 40 19  ALT 65* 59* 33  ALKPHOS 75 66 66   BILITOT 0.3 0.5 0.6  PROT 7.2 7.1 6.9  ALBUMIN 2.2* 2.3* 2.1*   No results for input(s): LIPASE, AMYLASE in the last 168 hours. No results for input(s): AMMONIA in the last 168 hours. Coagulation Profile: No results for input(s): INR, PROTIME in the last 168 hours. Cardiac Enzymes: No results for input(s): CKTOTAL, CKMB, CKMBINDEX, TROPONINI in the last 168 hours. BNP (last 3 results) No results for input(s): PROBNP in the last 8760 hours. HbA1C: No results for input(s): HGBA1C in the last 72 hours. CBG: Recent Labs  Lab 02/25/21 1622 02/25/21 2048 02/26/21 0751 02/26/21 1129 02/26/21 1537  GLUCAP 209* 144* 162* 213* 126*   Lipid Profile: No results for input(s): CHOL, HDL, LDLCALC, TRIG, CHOLHDL, LDLDIRECT in the last 72 hours. Thyroid Function Tests: No results for input(s): TSH, T4TOTAL, FREET4, T3FREE, THYROIDAB in the last 72 hours. Anemia Panel: No results for input(s): VITAMINB12, FOLATE, FERRITIN, TIBC, IRON, RETICCTPCT in the last 72 hours. Sepsis Labs: No results for input(s): PROCALCITON, LATICACIDVEN in the last 168 hours.  Recent Results (from the past 240 hour(s))  CULTURE, BLOOD (ROUTINE X 2) w Reflex to ID Panel     Status: Abnormal   Collection Time: 02/17/21  1:38 PM   Specimen: BLOOD  Result Value Ref Range Status   Specimen Description   Final    BLOOD RIGHT ANTECUBITAL Performed at [facility], [address], [place], [place] [address]    Special Requests   Final    BOTTLES DRAWN AEROBIC AND ANAEROBIC Blood Culture adequate volume Performed at [facility], [address] [place], [place] [address]    Culture  Setup Time   Final    GRAM POSITIVE COCCI ANAEROBIC BOTTLE ONLY CRITICAL RESULT CALLED TO, READ BACK BY AND VERIFIED WITH: JASON ROBBINS_2 /31/23 RH    Culture (A)  Final    STAPHYLOCOCCUS AUREUS SUSCEPTIBILITIES PERFORMED ON PREVIOUS CULTURE WITHIN THE LAST 5 DAYS. Performed at [facility], [place] [address], [place], [place] [address]    Report Status 02/20/2021 FINAL  Final  Blood Culture ID Panel (Reflexed)     Status: Abnormal   Collection Time: 02/17/21  1:38 PM  Result Value Ref Range Status   Enterococcus faecalis NOT DETECTED NOT DETECTED Final   Enterococcus Faecium NOT DETECTED NOT DETECTED Final   Listeria monocytogenes NOT DETECTED NOT DETECTED Final   Staphylococcus species DETECTED (A) NOT DETECTED Corrected    Comment: CRITICAL RESULT CALLED TO, READ BACK BY AND VERIFIED WITH: JASON ROBBINS_3  02/18/21 RH CORRECTED ON 02/01 AT 0457: PREVIOUSLY REPORTED AS DETECTED JASON ROBBINS_4  02/18/21 RH    Staphylococcus aureus (BCID) DETECTED (A) NOT DETECTED Final    Comment: CRITICAL RESULT CALLED TO, READ BACK BY AND VERIFIED WITH: JASON ROBBINS_5  02/18/21 RH  Staphylococcus epidermidis NOT DETECTED NOT DETECTED Final   Staphylococcus lugdunensis NOT DETECTED NOT DETECTED Final   Streptococcus species NOT DETECTED NOT DETECTED Final   Streptococcus agalactiae NOT DETECTED NOT DETECTED Final   Streptococcus pneumoniae NOT DETECTED NOT DETECTED Final   Streptococcus pyogenes NOT DETECTED NOT DETECTED Final   A.calcoaceticus-baumannii NOT DETECTED NOT DETECTED Final   Bacteroides fragilis NOT DETECTED NOT DETECTED Final   Enterobacterales NOT DETECTED NOT DETECTED Final   Enterobacter cloacae complex NOT DETECTED NOT DETECTED Final   Escherichia coli NOT DETECTED NOT DETECTED Final   Klebsiella aerogenes NOT DETECTED NOT DETECTED Final   Klebsiella oxytoca NOT DETECTED NOT DETECTED Final   Klebsiella pneumoniae NOT DETECTED NOT DETECTED Final   Proteus species NOT DETECTED NOT DETECTED Final   Salmonella species NOT DETECTED NOT DETECTED Final   Serratia marcescens NOT DETECTED NOT DETECTED Final   Haemophilus influenzae NOT DETECTED NOT DETECTED Final   Neisseria meningitidis NOT DETECTED NOT DETECTED Final   Pseudomonas aeruginosa NOT DETECTED NOT DETECTED Final    Stenotrophomonas maltophilia NOT DETECTED NOT DETECTED Final   Candida albicans NOT DETECTED NOT DETECTED Final   Candida auris NOT DETECTED NOT DETECTED Final   Candida glabrata NOT DETECTED NOT DETECTED Final   Candida krusei NOT DETECTED NOT DETECTED Final   Candida parapsilosis NOT DETECTED NOT DETECTED Final   Candida tropicalis NOT DETECTED NOT DETECTED Final   Cryptococcus neoformans/gattii NOT DETECTED NOT DETECTED Final   Meth resistant mecA/C and MREJ NOT DETECTED NOT DETECTED Final    Comment: Performed at [facility], [address] [place], [place] [address]  CULTURE, BLOOD (ROUTINE X 2) w Reflex to ID Panel     Status: None   Collection Time: 02/17/21  1:45 PM   Specimen: BLOOD  Result Value Ref Range Status   Specimen Description BLOOD BLOOD RIGHT HAND  Final   Special Requests   Final    BOTTLES DRAWN AEROBIC ONLY Blood Culture results may not be optimal due to an inadequate volume of blood received in culture bottles   Culture   Final    NO GROWTH 5 DAYS Performed at [facility], [address] [place], [place] [address]    Report Status 02/22/2021 FINAL  Final  Cath Tip Culture     Status: Abnormal   Collection Time: 02/18/21 10:49 AM   Specimen: Porta Cath; Other  Result Value Ref Range Status   Specimen Description   Final    PORTA CATH Performed at [facility], [address] [place], [place] [address]    Special Requests   Final    NONE Performed at [facility], [place], [place] [address]    Culture 20,000 COLONIES/mL STAPHYLOCOCCUS AUREUS (A)  Final   Report Status 02/21/2021 FINAL  Final   Organism ID, Bacteria STAPHYLOCOCCUS AUREUS (A)  Final      Susceptibility   Staphylococcus aureus - MIC*    CIPROFLOXACIN <=0.5 SENSITIVE Sensitive     ERYTHROMYCIN >=8 RESISTANT Resistant     GENTAMICIN <=0.5 SENSITIVE Sensitive     OXACILLIN 0.5 SENSITIVE Sensitive     TETRACYCLINE <=1 SENSITIVE  Sensitive     VANCOMYCIN 1 SENSITIVE Sensitive     TRIMETH/SULFA <=10 SENSITIVE Sensitive     CLINDAMYCIN RESISTANT Resistant     RIFAMPIN <=0.5 SENSITIVE Sensitive     Inducible Clindamycin POSITIVE Resistant     * 20,000 COLONIES/mL STAPHYLOCOCCUS AUREUS  CULTURE, BLOOD (ROUTINE X  2) w Reflex to ID Panel     Status: None (Preliminary result)   Collection Time: 02/20/21  3:43 AM   Specimen: BLOOD  Result Value Ref Range Status   Specimen Description BLOOD RIGHT HAND  Final   Special Requests   Final    BOTTLES DRAWN AEROBIC AND ANAEROBIC Blood Culture adequate volume   Culture   Final    NO GROWTH 4 DAYS Performed at [facility], [address] [place], [place] [address]    Report Status PENDING  Incomplete  CULTURE, BLOOD (ROUTINE X 2) w Reflex to ID Panel     Status: Abnormal   Collection Time: 02/20/21  3:46 AM   Specimen: BLOOD  Result Value Ref Range Status   Specimen Description   Final    BLOOD RIGHT WRIST Performed at [facility], [address], [place], [place] [address]    Special Requests   Final    IN PEDIATRIC BOTTLE Blood Culture results may not be optimal due to an excessive volume of blood received in culture bottles Performed at [facility], [address] [place], [place] [address]    Culture  Setup Time   Final    GRAM POSITIVE COCCI IN PEDIATRIC BOTTLE CRITICAL RESULT CALLED TO, READ BACK BY AND VERIFIED WITH: Windy Fast 02/27/21 0913 MW Performed at [facility], [place] [address], [place], [place] [address]    Culture STAPHYLOCOCCUS AUREUS (A)  Final   Report Status 02/23/2021 FINAL  Final   Organism ID, Bacteria STAPHYLOCOCCUS AUREUS  Final      Susceptibility   Staphylococcus aureus - MIC*    CIPROFLOXACIN <=0.5 SENSITIVE Sensitive     ERYTHROMYCIN >=8 RESISTANT Resistant     GENTAMICIN <=0.5 SENSITIVE Sensitive     OXACILLIN 0.5 SENSITIVE Sensitive     TETRACYCLINE <=1 SENSITIVE Sensitive     VANCOMYCIN 1  SENSITIVE Sensitive     TRIMETH/SULFA <=10 SENSITIVE Sensitive     CLINDAMYCIN RESISTANT Resistant     RIFAMPIN <=0.5 SENSITIVE Sensitive     Inducible Clindamycin POSITIVE Resistant     * STAPHYLOCOCCUS AUREUS  Blood Culture ID Panel (Reflexed)     Status: Abnormal   Collection Time: 02/20/21  3:46 AM  Result Value Ref Range Status   Enterococcus faecalis NOT DETECTED NOT DETECTED Final   Enterococcus Faecium NOT DETECTED NOT DETECTED Final   Listeria monocytogenes NOT DETECTED NOT DETECTED Final   Staphylococcus species DETECTED (A) NOT DETECTED Final    Comment: CRITICAL RESULT CALLED TO, READ BACK BY AND VERIFIED WITH: [facility] 02/27/21 0913 MW    Staphylococcus aureus (BCID) DETECTED (A) NOT DETECTED Final    Comment: CRITICAL RESULT CALLED TO, READ BACK BY AND VERIFIED WITH: SHEMA HALLAJI 02/27/21 0913 MW    Staphylococcus epidermidis NOT DETECTED NOT DETECTED Final   Staphylococcus lugdunensis NOT DETECTED NOT DETECTED Final   Streptococcus species NOT DETECTED NOT DETECTED Final   Streptococcus agalactiae NOT DETECTED NOT DETECTED Final   Streptococcus pneumoniae NOT DETECTED NOT DETECTED Final   Streptococcus pyogenes NOT DETECTED NOT DETECTED Final   A.calcoaceticus-baumannii NOT DETECTED NOT DETECTED Final   Bacteroides fragilis NOT DETECTED NOT DETECTED Final   Enterobacterales NOT DETECTED NOT DETECTED Final   Enterobacter cloacae complex NOT DETECTED NOT DETECTED Final   Escherichia coli NOT DETECTED NOT DETECTED Final   Klebsiella aerogenes NOT DETECTED NOT DETECTED Final   Klebsiella oxytoca NOT DETECTED NOT DETECTED Final   Klebsiella pneumoniae NOT  DETECTED NOT DETECTED Final   Proteus species NOT DETECTED NOT DETECTED Final   Salmonella species NOT DETECTED NOT DETECTED Final   Serratia marcescens NOT DETECTED NOT DETECTED Final   Haemophilus influenzae NOT DETECTED NOT DETECTED Final   Neisseria meningitidis NOT DETECTED NOT DETECTED Final   Pseudomonas aeruginosa  NOT DETECTED NOT DETECTED Final   Stenotrophomonas maltophilia NOT DETECTED NOT DETECTED Final   Candida albicans NOT DETECTED NOT DETECTED Final   Candida auris NOT DETECTED NOT DETECTED Final   Candida glabrata NOT DETECTED NOT DETECTED Final   Candida krusei NOT DETECTED NOT DETECTED Final   Candida parapsilosis NOT DETECTED NOT DETECTED Final   Candida tropicalis NOT DETECTED NOT DETECTED Final   Cryptococcus neoformans/gattii NOT DETECTED NOT DETECTED Final   Meth resistant mecA/C and MREJ NOT DETECTED NOT DETECTED Final    Comment: Performed at [facility], [address] [place], [place] [address]  CULTURE, BLOOD (ROUTINE X 2) w Reflex to ID Panel     Status: None (Preliminary result)   Collection Time: 02/24/21  4:18 AM   Specimen: BLOOD  Result Value Ref Range Status   Specimen Description BLOOD RIGHT HAND  Final   Special Requests   Final    BOTTLES DRAWN AEROBIC AND ANAEROBIC Blood Culture adequate volume   Culture   Final    NO GROWTH 2 DAYS Performed at [facility], [address] [place], [place] [address]    Report Status PENDING  Incomplete  CULTURE, BLOOD (ROUTINE X 2) w Reflex to ID Panel     Status: None (Preliminary result)   Collection Time: 02/24/21  4:30 AM   Specimen: BLOOD  Result Value Ref Range Status   Specimen Description BLOOD RIGHT WRIST  Final   Special Requests   Final    BOTTLES DRAWN AEROBIC AND ANAEROBIC Blood Culture adequate volume   Culture   Final    NO GROWTH 2 DAYS Performed at [facility], [address], [place], [place] [address]    Report Status PENDING  Incomplete         Radiology Studies: [place] EKG SITE RITE  Result Date: 02/26/2021 If Site Rite image not attached, placement could not be confirmed due to current cardiac rhythm.       Scheduled Meds:  amLODipine  5 mg Oral Q breakfast   aspirin EC  81 mg Oral Q breakfast   carbamazepine  200 mg Oral TID   Chlorhexidine Gluconate Cloth   6 each Topical Daily   citalopram  10 mg Oral Daily   enoxaparin (LOVENOX) injection  40 mg Subcutaneous Q24H   feeding supplement (GLUCERNA SHAKE)  237 mL Oral BID BM   insulin aspart  0-20 Units Subcutaneous TID WC   insulin aspart  0-5 Units Subcutaneous QHS   insulin aspart  4 Units Subcutaneous TID WC   lisinopril  40 mg Oral Q breakfast   magnesium oxide  400 mg Oral Daily   metoprolol succinate  50 mg Oral QHS   multivitamin with minerals  1 tablet Oral Daily   simvastatin  20 mg Oral QPM   sodium chloride flush  10-40 mL Intracatheter Q12H   Continuous Infusions:  nafcillin (NAFCIL) continuous infusion 12 g (02/26/21 1548)     LOS: 11 days    Time spent: 32 mins     Wyvonnia Dusky, MD [facility] Pager [phone]  If 7PM-7AM, please contact night-coverage 02/26/2021, 4:07 PM

## 2021-02-26 NOTE — Progress Notes (Addendum)
Physical Therapy Treatment Patient Details Name: Tamara Mejia MRN: [id number] DOB: 1950/02/01 Today's Date: 02/26/2021   History of Present Illness Pt is a 71 y/o F admitted on 02/14/21 with c/c of R hip pain. Pt is admitted for work-up. MRI findings consistent with muscular strain and  associated obturator internus bursitis. Blood cultures positive for MSSA. 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 She denies any c/o pain while at rest. She is able to perform sit to stand w/ CGA using RW; but requires verbal cueing for proper UE placement for safety and optimal utilization w/ transfer. Once standing Pt was able to ambulate ~152ft w/ CGA and RW. She reported no c/o negative symptoms and stated that she "felt really good to walk that far" and asked how far she had just walked before PT exited room. Pt will benefit from continued skilled PT in order to improve LE strength, mobility, and restore PLOF. Current discharge recommendation needs to be updated due to [facility] and patient discharge to [facility], and due to the level of assistance required by the patient to ensure safety and improve overall function.   Recommendations for follow up therapy are one component of a multi-disciplinary discharge planning process, led by the attending physician.  Recommendations may be updated based on patient status, additional functional criteria and insurance authorization.  Follow Up Recommendations  Acute inpatient rehab (3hours/day)     Assistance Recommended at Discharge Intermittent Supervision/Assistance  Patient can return home with the following A little help with walking and/or transfers;A little help with bathing/dressing/bathroom;Help with stairs or ramp for entrance;Direct  supervision/assist for medications management;Assistance with cooking/housework;Direct supervision/assist for financial management;Assist for transportation   Equipment Recommendations  Rolling walker (2 wheels);BSC/3in1    Recommendations for Other Services       Precautions / Restrictions Precautions Precautions: Fall Restrictions Weight Bearing Restrictions: Yes RLE Weight Bearing: Weight bearing as tolerated     Mobility  Bed Mobility                    Transfers Overall transfer level: Needs assistance Equipment used: Rolling walker (2 wheels) Transfers: Sit to/from Stand Sit to Stand: Min guard           General transfer comment: VC for hand placement    Ambulation/Gait Ambulation/Gait assistance: Min guard Gait Distance (Feet): 180 Feet Assistive device: Rolling walker (2 wheels) Gait Pattern/deviations: Decreased step length - right, Decreased step length - left, Trunk flexed, Step-through pattern Gait velocity: decreased         Stairs             Wheelchair Mobility    Modified Rankin (Stroke Patients Only)       Balance Overall balance assessment: Needs assistance Sitting-balance support: No upper extremity supported, Feet supported Sitting balance-Leahy Scale: Good     Standing balance support: No upper extremity supported, During functional activity, Bilateral upper extremity supported Standing balance-Leahy Scale: Fair                              Cognition Arousal/Alertness: Awake/alert Behavior During Therapy: WFL for tasks assessed/performed Overall Cognitive Status: History of cognitive impairments - at baseline  General Comments: intermittent VC for safety/sequencing with RW        Exercises      General Comments        Pertinent Vitals/Pain Pain Assessment Pain Assessment: No/denies pain    Home Living                           Prior Function            PT Goals (current goals can now be found in the care plan section) Progress towards PT goals: Progressing toward goals    Frequency    Min 2X/week      PT Plan Current plan remains appropriate    Co-evaluation              AM-PAC PT "6 Clicks" Mobility   Outcome Measure  Help needed turning from your back to your side while in a flat bed without using bedrails?: A Little Help needed moving from lying on your back to sitting on the side of a flat bed without using bedrails?: A Little Help needed moving to and from a bed to a chair (including a wheelchair)?: A Little Help needed standing up from a chair using your arms (e.g., wheelchair or bedside chair)?: A Little Help needed to walk in hospital room?: A Little Help needed climbing 3-5 steps with a railing? : A Lot 6 Click Score: 17    End of Session Equipment Utilized During Treatment: Gait belt Activity Tolerance: Patient tolerated treatment well;No increased pain Patient left: in chair;with call bell/phone within reach;with chair alarm set Nurse Communication: Mobility status PT Visit Diagnosis: Muscle weakness (generalized) (M62.81);Difficulty in walking, not elsewhere classified (R26.2);Unsteadiness on feet (R26.81) Pain - Right/Left: Right Pain - part of body: Hip     Time: 1022-1040 PT Time Calculation (min) (ACUTE ONLY): 18 min  Charges:                         Jonnie Kind, SPT 02/26/2021, 11:48 AM

## 2021-02-26 NOTE — TOC Progression Note (Addendum)
Transition of Care [facility]) - Progression Note    Patient Details  Name: Tamara Mejia MRN: [id number] Date of Birth: May 03, 1950  Transition of [facility]) CM/SW [place], RN Phone Number: 02/26/2021, 2:45 PM  Clinical Narrative:   Per Tamara Mejia with [facility] in [place] is going to offer a bed to the patient, however, they need the DC summary to state that Chemo will be on hold until she finished her IV ABX and Discharges back to her group home, They can take her tomorrow, they are calling the group home to confirm that they will take her back and calling the Legal Guardian to confirm everything, I spoke with Tamara Mejia the patients cousin and provided the address to the LTAC, Tamara Mejia is her Legal Guardian however she is getting radiation today and unavailable   Tamara Mejia is accepting the patient and can take tomorrow, Tamara Mejia was provided the pharmacy number i Tamara Mejia [phone], They are getting all the consents signed and can take tomorrow if her PICC is in place, provided this inforamtion to the Physicians and Nurses  Expected Discharge Plan: Skilled Nursing Facility Barriers to Discharge: Continued Medical Work up  Expected Discharge Plan and Services Expected Discharge Plan: [place]                                               Social Determinants of Health (SDOH) Interventions    Readmission Risk Interventions No flowsheet data found.

## 2021-02-26 NOTE — Progress Notes (Signed)
Occupational Therapy Treatment Patient Details Name: Tamara Mejia MRN: [id number] DOB: 1950-12-28 Today's Date: 02/26/2021   History of present illness Pt is a 71 y/o F admitted on 02/14/21 with c/c of R hip pain. Pt is admitted for work-up. MRI findings consistent with muscular strain and  associated obturator internus bursitis. Blood cultures positive for MSSA. 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 Upon arrival to room, pt awake and seated upright in recliner following PT session. Pt agreeable to OT tx and reporting urge to urinate. Pt required SUPERVISION for functional mobility of short household distances with RW, MIN A for toilet transfer (with use of grab bar), MIN GUARD for peri-care while sitting/leaning laterally, and SUPERVISION for standing hand hygiene. Once returned to recliner, pt performed seated grooming tasks requiring set-up assistance only. At end of session, pt provided with assistance for calling and ordering lunch through meal services. Pt is making good progress toward goals and continues to benefit from skilled OT services to maximize return to PLOF and minimize risk of future falls, injury, caregiver burden, and readmission. Will continue to follow POC. Per discussion with TOC, pt is planning to discharge to [facility] (discharge recommendation updated to reflect discussion).    Recommendations for follow up therapy are one component of a multi-disciplinary discharge planning process, led by the attending physician.  Recommendations may be updated based on patient status, additional functional criteria and insurance authorization.    Follow Up Recommendations  Long-term institutional care without follow-up therapy    Assistance Recommended at Discharge Frequent or constant  Supervision/Assistance  Patient can return home with the following  Direct supervision/assist for medications management;Assist for transportation;Assistance with cooking/housework;Help with stairs or ramp for entrance;A little help with walking and/or transfers;A lot of help with bathing/dressing/bathroom   Equipment Recommendations  BSC/3in1;Other (comment) (reacher, LH sponge)       Precautions / Restrictions Precautions Precautions: Fall Restrictions Weight Bearing Restrictions: No       Mobility Bed Mobility               General bed mobility comments: NT, up in recliner at start and end of session    Transfers Overall transfer level: Needs assistance Equipment used: Rolling walker (2 wheels) Transfers: Sit to/from Stand Sit to Stand: Min guard, Min assist           General transfer comment: Requires VC for hand placement. MIN GUARD from recliner, MIN A from toilet (d/t low height)     Balance Overall balance assessment: Needs assistance Sitting-balance support: No upper extremity supported, Feet supported Sitting balance-Leahy Scale: Good     Standing balance support: No upper extremity supported, During functional activity, Bilateral upper extremity supported Standing balance-Leahy Scale: Fair Standing balance comment: requires only supervision for standing hand hygiene                           ADL either performed or assessed with clinical judgement   ADL Overall ADL's : Needs assistance/impaired     Grooming: Wash/dry hands;Supervision/safety;Standing;Applying deodorant;Set up;Sitting                   Toilet Transfer: Minimal assistance;Regular Toilet;Grab bars Toilet Transfer Details (indicate cue type and reason): Requires verbal cues for safe hand placement, MIN A for upward momentum from  toilet Toileting- Clothing Manipulation and Hygiene: Min guard;Sitting/lateral lean       Functional mobility during ADLs:  Supervision/safety;Rolling walker (2 wheels)        Cognition Arousal/Alertness: Awake/alert Behavior During Therapy: WFL for tasks assessed/performed Overall Cognitive Status: History of cognitive impairments - at baseline                                 General Comments: Pleasant and agreeable throughout                   Pertinent Vitals/ Pain       Pain Assessment Pain Assessment: No/denies pain         Frequency  Min 3X/week        Progress Toward Goals  OT Goals(current goals can now be found in the care plan section)  Progress towards OT goals: Progressing toward goals  Acute Rehab OT Goals Patient Stated Goal: to go home OT Goal Formulation: With patient Time For Goal Achievement: 03/04/21 Potential to Achieve Goals: Good  Plan Discharge plan needs to be updated;Frequency remains appropriate       AM-PAC OT "6 Clicks" Daily Activity     Outcome Measure   Help from another person eating meals?: None Help from another person taking care of personal grooming?: A Little Help from another person toileting, which includes using toliet, bedpan, or urinal?: A Little Help from another person bathing (including washing, rinsing, drying)?: A Lot Help from another person to put on and taking off regular upper body clothing?: A Little Help from another person to put on and taking off regular lower body clothing?: A Lot 6 Click Score: 17    End of Session Equipment Utilized During Treatment: Rolling walker (2 wheels)  OT Visit Diagnosis: Other abnormalities of gait and mobility (R26.89)   Activity Tolerance Patient tolerated treatment well   Patient Left in chair;with call bell/phone within reach;with chair alarm set   Nurse Communication Mobility status        Time: 9791-5041 OT Time Calculation (min): 23 min  Charges: OT General Charges $OT Visit: 1 Visit OT Treatments $Self Care/Home Management : 23-37 mins  Fredirick Maudlin,  OTR/L [place]

## 2021-02-26 NOTE — Progress Notes (Cosign Needed)
Pt eating breakfast in chair. She is calm and cooperative. Will continue to monitor.

## 2021-02-26 NOTE — Progress Notes (Signed)
ID Pt in bed Cheerful No issues  O/e looks well No discomfort  Patient Vitals for the past 24 hrs:  BP Temp Temp src Pulse Resp SpO2  02/26/21 0748 (!) 149/80 98.6 F (37 C) Oral 94 16 97 %  02/26/21 0407 (!) 151/71 98.6 F (37 C) Oral 86 16 98 %  02/25/21 2119 -- (!) 97.4 F (36.3 C) Oral -- -- --  02/25/21 2002 138/61 (!) 100.6 F (38.1 C) Oral 94 16 99 %  02/25/21 1732 (!) 142/66 -- -- 95 15 97 %  02/25/21 1525 (!) 193/88 98.2 F (36.8 C) -- 98 16 99 %    Removed port - looks well Chest b/l air entry [id number] Abd soft CNS grossly non focal    Labs CBC Latest Ref Rng & Units 02/24/2021 02/22/2021 02/19/2021  WBC 4.0 - 10.5 K/uL 6.4 6.6 7.7  Hemoglobin 12.0 - 15.0 g/dL 8.3(L) 8.5(L) 8.0(L)  Hematocrit 36.0 - 46.0 % 25.9(L) 26.3(L) 24.6(L)  Platelets 150 - 400 K/uL 411(H) 417(H) 390    CMP Latest Ref Rng & Units 02/24/2021 02/22/2021 02/19/2021  Glucose 70 - 99 mg/dL 187(H) 154(H) 158(H)  BUN 8 - 23 mg/dL 18 12 14   Creatinine 0.44 - 1.00 mg/dL 0.68 0.60 0.51  Sodium 135 - 145 mmol/L 133(L) 130(L) 130(L)  Potassium 3.5 - 5.1 mmol/L 4.3 4.2 4.3  Chloride 98 - 111 mmol/L 100 98 96(L)  CO2 22 - 32 mmol/L 24 27 27   Calcium 8.9 - 10.3 mg/dL 8.4(L) 8.4(L) 7.9(L)  Total Protein 6.5 - 8.1 g/dL 7.1 7.2 6.5  Total Bilirubin 0.3 - 1.2 mg/dL 0.5 0.3 0.2(L)  Alkaline Phos 38 - 126 U/L 66 75 63  AST 15 - 41 U/L 40 58(H) 89(H)  ALT 0 - 44 U/L 59(H) 65(H) 68(H)    Micro 02/14/21 BC- MSSA 02/17/21 BC MSSA 02/18/21 Cath tip 2/2/BC 1 of 4 positive  Impression/recommendation MSSA bacteremia  due to infected port - high bioburden repeat blood culture from 1/30 was positive as well.  PORT removed 02/18/21- cath tip positive for MSSA  repeat blood culture from 2/2 is still positive TEE NO endocarditis  cefazolin changed to nafcillin on 02/21/2021. Negative blood culture 02/24/21- so PICC can be placed Will need minimum  6 weeks of IV nafcillin Need to make sure that SNF/LTAC can dose her properly  - 2 options- q4hr dosing or 24hr continuous infusion. Called and left  a [facility] to [facility]  [phone]   MRI of the lumbosacral spine showed L1 paraspinal infection inferior to the transverse portions with surrounding inflammation tracking to the right neural foramina and along the right aspect of the thecal sac with dural and epidural inflammation causing moderate thecal sac narrowing and severe right neural foraminal narrowing.  No evidence of discitis or osteomyelitis. Some amount of muscle edema in the right obturator muscle at the insertion of the ischial tuberosity with enhancement on the postcontrast images. Patient was discussed with neurosurgeon and no role for surgery currently. No abscess big enough for anything kind of aspiration even by IR.    Rt hip pain-resolved  no septic arthritis -possible obturator internus bursitis  Anemia  Ca breast on chemo  Discussed the management with patient and care team

## 2021-02-26 NOTE — TOC Progression Note (Addendum)
Transition of Care [facility]) - Progression Note    Patient Details  Name: Tamara Mejia MRN: [id number] Date of Birth: Jun 13, 1950  Transition of Care [facility]) CM/SW [place], RN Phone Number: 02/26/2021, 9:14 AM  Clinical Narrative:   I spoke with Geraldo Pitter the legal Guardian and she asked that I call her cousin Vinnie Level and speak with her about the Bronson Ing with [facility] came to the hospital to talk about the patient and answer any questions  I called Vinnie Level at the request of [name], I explained that [facility] would not be able to do the IV ABX  Due to the cost and the fact that it is continuous, I explained that there are 2 LTAC in [place], I also explained that I weill need to verify that they have a bed available, she asked that we look for an LTAC in [place] area, Architectural technologist has a [place] facility and will check to see if they have a bed available   Expected Discharge Plan: [place] Barriers to Discharge: Continued Medical Work up  Expected Discharge Plan and Services Expected Discharge Plan: [place]                                               Social Determinants of Health (SDOH) Interventions    Readmission Risk Interventions No flowsheet data found.

## 2021-02-26 NOTE — TOC Progression Note (Signed)
Transition of Care [facility]) - Progression Note    Patient Details  Name: Tamara Mejia MRN: [id number] Date of Birth: 1950-11-18  Transition of Care [facility]) CM/SW [place], RN Phone Number: 02/26/2021, 8:47 AM  Clinical Narrative:   [facility] is not able to do the Continuous IV ABX, the recommendation from ID is LTAC, I called Rob with [facility]  at [phone] and left a VM requesting a call back, I called [facility] at [phone] and left a vm requesting a call back, I called Geraldo Pitter the Legal Guardian at [phone] left a secure VM requesting a call back     Expected Discharge Plan: [place] Barriers to Discharge: Continued Medical Work up  Expected Discharge Plan and Services Expected Discharge Plan: [place]                                               Social Determinants of Health (SDOH) Interventions    Readmission Risk Interventions No flowsheet data found.

## 2021-02-26 NOTE — TOC Progression Note (Signed)
Transition of Care [facility]) - Progression Note    Patient Details  Name: Tamara Mejia MRN: [id number] Date of Birth: 02-19-1950  Transition of Care [facility]) CM/SW [place], RN Phone Number: 02/26/2021, 9:02 AM  Clinical Narrative:   [facility] called back and stated that he will have Anderson Malta call me and discuss, They do have a bed available    Expected Discharge Plan: [place] Barriers to Discharge: Continued Medical Work up  Expected Discharge Plan and Services Expected Discharge Plan: [place]                                               Social Determinants of Health (SDOH) Interventions    Readmission Risk Interventions No flowsheet data found.

## 2021-02-27 LAB — CBC
HCT: 26.3 % — ABNORMAL LOW (ref 36.0–46.0)
Hemoglobin: 8.2 g/dL — ABNORMAL LOW (ref 12.0–15.0)
MCH: 29.6 pg (ref 26.0–34.0)
MCHC: 31.2 g/dL (ref 30.0–36.0)
MCV: 94.9 fL (ref 80.0–100.0)
Platelets: 391 10*3/uL (ref 150–400)
RBC: 2.77 MIL/uL — ABNORMAL LOW (ref 3.87–5.11)
RDW: 15.6 % — ABNORMAL HIGH (ref 11.5–15.5)
WBC: 4.7 10*3/uL (ref 4.0–10.5)
nRBC: 0 % (ref 0.0–0.2)

## 2021-02-27 LAB — GLUCOSE, CAPILLARY
Glucose-Capillary: 146 mg/dL — ABNORMAL HIGH (ref 70–99)
Glucose-Capillary: 180 mg/dL — ABNORMAL HIGH (ref 70–99)
Glucose-Capillary: 153 mg/dL — ABNORMAL HIGH (ref 70–99)
Glucose-Capillary: 194 mg/dL — ABNORMAL HIGH (ref 70–99)

## 2021-02-27 LAB — BASIC METABOLIC PANEL
Anion gap: 8 (ref 5–15)
BUN: 15 mg/dL (ref 8–23)
CO2: 26 mmol/L (ref 22–32)
Calcium: 8.2 mg/dL — ABNORMAL LOW (ref 8.9–10.3)
Chloride: 100 mmol/L (ref 98–111)
Creatinine, Ser: 0.62 mg/dL (ref 0.44–1.00)
GFR, Estimated: >60 mL/min (ref >60)
Glucose, Bld: 171 mg/dL — ABNORMAL HIGH (ref 70–99)
Potassium: 3.9 mmol/L (ref 3.5–5.1)
Sodium: 134 mmol/L — ABNORMAL LOW (ref 135–145)

## 2021-02-27 MED ORDER — NAFCILLIN IV (FOR PTA / DISCHARGE USE ONLY): 12.0000 g | INTRAVENOUS | 0 refills | Status: AC

## 2021-02-27 MED ORDER — SODIUM CHLORIDE 0.9% FLUSH: 10.0000 mL | INTRAVENOUS | Status: DC | PRN

## 2021-02-27 MED ORDER — METOPROLOL SUCCINATE ER 50 MG PO TB24: 50.0000 mg | ORAL_TABLET | Freq: Every day | ORAL | 0 refills | Status: AC

## 2021-02-27 MED ORDER — SODIUM CHLORIDE 0.9 % IV SOLN: 12.0000 g | INTRAVENOUS | Status: DC

## 2021-02-27 MED ORDER — CITALOPRAM HYDROBROMIDE 10 MG PO TABS
10.0000 mg | ORAL_TABLET | Freq: Every day | ORAL | 0 refills | Status: DC
Start: 2021-02-28 — End: 2021-10-22

## 2021-02-27 MED ORDER — HEPARIN SOD (PORK) LOCK FLUSH 100 UNIT/ML IV SOLN
250.0000 [IU] | INTRAVENOUS | Status: DC | PRN
  Filled 2021-02-27: qty 5

## 2021-02-27 MED ORDER — SODIUM CHLORIDE 0.9% FLUSH
10.0000 mL | Freq: Two times a day (BID) | INTRAVENOUS | Status: DC
  Administered 2021-02-27 (×2): 10 mL

## 2021-02-27 NOTE — Progress Notes (Signed)
Securechat sent to [employer] RN re removal of PIV and documentation of site upon removal.  Site infusing ABT when at bedside, but noted site to be red and slightly swollen.  RN to document detail of site upon removal.

## 2021-02-27 NOTE — Treatment Plan (Addendum)
Diagnosis: MSSA bacteremia and paraspinal abscess Baseline Creatinine <1   No Known Allergies  OPAT Orders Discharge antibiotics: Nafcillin 12 grams in 500cc saline continuous every 24 hour infusion ( The other option would be Nafcillin 2 grams IV every 4 hours with no missed doses)  End Date: 04/07/21   [facility] Care Per Protocol:  Labs weekly  mondaywhile on IV antibiotics: _X_ CBC with differential  _X_ CMP  X_ ESR  _X_ Please pull PIC at completion of IV antibiotics _  Fax weekly labs to  [place] [phone]  Clinic Follow Up Appt: 04/10/21 at 9.15 AM   Call [phone] if any concerns with IV antibiotics, side effects from nafcillin, abnormal labs and on completion of antibiotic and discharge

## 2021-02-27 NOTE — Discharge Summary (Addendum)
Physician Discharge Summary  BRAXTYN DORFF EYC:[id number] DOB: 10-21-1950 DOA: 02/14/2021  PCP: Kirk Ruths, MD  Admit date: 02/14/2021 Discharge date: 02/28/21  Admitted From: home  Disposition:  home   Recommendations for Outpatient Follow-up:  Follow up with PCP in 1-2 weeks F/u w/ ID, Dr. Delaine Lame, in 3-4 weeks F/u w/ onco, Dr. Tasia Catchings, in 4- 6 weeks  Home Health: no Equipment/Devices:  Discharge Condition: stable  CODE STATUS: full  Diet recommendation: Heart Healthy / Carb Modified   Brief/Interim Summary: HPI was taken from Dr. Tobie Poet: Ms. 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   Vitals in the emergency department showed temperature of 97.3, heart rate of 107, respiration rate of 18, initial blood pressure of 87/67, improved to 133/86, SPO2 of 98% on room air.  Serum sodium 123, potassium 3.4, chloride of 86, bicarb 25, BUN of 15, serum creatinine of 0.81, nonfasting blood glucose 285, GFR greater than 60, WBC was elevated at 16.5, hemoglobin 9.2, platelets of 328.   No medical treatment was initiated in the emergency department.   At bedside, she is able to tell me her name, age, current location of hospital and current calender year of 77.  She is able to tell me that her caregiver is at bedside.  She states her leg started hurting on Friday.    Caregiver denies known sick contacts, cough.   She denies new cough, chest pain, nausea, vomiting, abdominal pain, burning or pain when she pees, diarrhea.   Caregiver did endorse that patient had slid down her chair onto her right side a  couple of days ago.  However caregiver states that her leg was hurting prior to that.   Social history: She lives at a group home.   ROS: Unable to fully complete as patient has learning disability/cognitive dysfunction.   ED Course: Discussed with emergency medicine provider, patient requiring hospitalization for chief concerns of hyponatremia.   As per Dr. 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   Imaging of the hip including MRI was consistent with muscle strain and likely bursitis.     Patient was started on empiric antibiotics on admission due to leukocytosis, tachycardia and concern for possible UTI and/or pneumonia.  Blood cultures subsequently positive for MSSA.     Port removed on 02/18/2021.   Admitted with sepsis due to MSSA bacteremia.  Patient again with positive blood cultures on 02/20/2021.  Antibiotic switched over to nafcillin.   Small abscess seen on MRI of the lumbar spine on 02/22/2021.  Too small to drain at this point.   Repeat blood cultures drawn on 02/24/2021.  If negative for 2 days can have PICC line placed.   As per Dr. Jimmye Norman 2/8-02/27/21: Repeat blood cxs x 2 have been NGTD. Pt will continue on IV nafcillin x 6 weeks as per ID.  TEE showed no endocarditis. PT/OT evaluated the pt and recommends LTAC. For more information, please see previous progress/consult notes.     Discharge Diagnoses:  Principal Problem:   MSSA bacteremia Active Problems:   Essential hypertension   Hyperlipidemia   Seizure (HCC)   Severe obesity (BMI 35.0-35.9 with comorbidity)  (HCC)   Hypokalemia   Hypomagnesemia   Malignant neoplasm of left breast in female, estrogen receptor positive ([place])   right Hip pain   Hyponatremia   Sepsis ([place])   Lives in group home   Anemia of chronic disease   Type 2 diabetes mellitus with hyperlipidemia ([place])   Paraspinal abscess (HCC)   Depression  MSSA bacteremia: POA. Continue on IV nafcillin. Repeat blood cxs x 2 NGTD. Port removed 02/18/21 & cx tip positive for MSSA. Will need 6 weeks of IV antibiotics as per ID.  PICC line will be placed 02/27/21    Paraspinal abscess: too small at this point for any drainage. Medical management currently as per neuro surg& IR.,   Sepsis: present on admission. Met criteria w/ leukocytosis, fever & MSSA bacteremia. Resolved    Right Hip pain: resolved   Obesity: BMI 34.9. Complicates overall care & prognosis    Seizure: continue on home dose of tegretol    HTN: continue on lisinopril, amlodipine, metoprolol    Intellectual disability/delay: live is a group home.    Depression: severity unknown. Started on celexa this admission and will continue at d/c   DM2: fairly well contolled, HbA1c 7.3. Continue on home anti-DM meds at d/c   Anemia of chronic disease: likely secondary to chemo. H&H are labile    Hyponatremia: almost WNL    Breast cancer: management as per onco outpatient, Dr. Tasia Catchings. Hold chemo until IV abxs are complete    Hypomagnesemia: will monitor intermittently    Hypokalemia: WNL today    HLD: continue on statin   Discharge Instructions  Discharge Instructions     Diet - low sodium heart healthy   Complete by: As directed    Diet Carb Modified   Complete by: As directed    Discharge instructions   Complete by: As directed    F/u w/ PCP in 1-2 weeks. F/u w/ ID, Dr. Delaine Lame, in 3-4 weeks. F/u onco, Dr. Tasia Catchings, in 4-6 weeks   Home infusion instructions   Complete by: As directed    Instructions: Flushing of vascular access device: 0.9% NaCl pre/post medication  administration and prn patency; Heparin 100 u/ml, 6ml for implanted ports and Heparin 10u/ml, 22ml for all other central venous catheters.   Increase activity slowly   Complete by: As directed       Allergies as of 02/28/2021   No Known Allergies      Medication List     STOP taking these medications    ibuprofen 200 MG tablet Commonly known as: ADVIL       TAKE these medications    acetaminophen 500 MG tablet Commonly known as: TYLENOL Take 1,000 mg by mouth every 8 (eight) hours as needed (pain.).   amLODipine 5 MG tablet Commonly known as: NORVASC Take 5 mg by mouth in the morning.   aspirin 81 MG EC tablet Take 81 mg by mouth in the morning. Swallow whole.   carbamazepine 200 MG tablet Commonly known as: TEGRETOL TAKE ONE TABLET BY MOUTH THREE TIMES DAILY. (SEIZURE CONTROL)   citalopram 10 MG tablet Commonly known as: CELEXA Take 1 tablet (10 mg total) by mouth daily.  dexamethasone 4 MG tablet Commonly known as: DECADRON Take 2 tablets (8 mg total) by mouth 2 (two) times daily. Start the day before Taxotere. Then again the day after chemo for 3 days.   HYDROcodone-acetaminophen 10-325 MG tablet Commonly known as: NORCO Take 1 tablet by mouth every 6 (six) hours as needed.   lidocaine-prilocaine cream Commonly known as: EMLA Apply to affected area once   lisinopril-hydrochlorothiazide 20-12.5 MG tablet Commonly known as: ZESTORETIC Take 1 tablet by mouth every morning.   loratadine 10 MG tablet Commonly known as: Claritin Take 1 tablet (10 mg total) by mouth daily. Take Claritin daily for 4 days, Day1-4 of each chemo cycle.   metFORMIN 500 MG tablet Commonly known as: GLUCOPHAGE Take by mouth 2 (two) times daily with a meal.   metoprolol succinate 50 MG 24 hr tablet Commonly known as: TOPROL-XL Take 1 tablet (50 mg total) by mouth at bedtime. Take with or immediately following a meal.   nafcillin  IVPB Inject 12 g into the vein continuous.  Infuse nafcillin 12gm IV over 24h as a continuous infusion Indication: MSSA bacteremia and parapspinal abscess Last Day of Therapy:  04/07/2021 Labs - Once weekly:  CBC/D, CMP, ESR Pull PICC at completion of antibiotics   ondansetron 8 MG tablet Commonly known as: Zofran Take 1 tablet (8 mg total) by mouth 2 (two) times daily as needed for refractory nausea / vomiting. Start on day 3 after chemo.   prochlorperazine 10 MG tablet Commonly known as: COMPAZINE Take 1 tablet (10 mg total) by mouth every 6 (six) hours as needed (Nausea or vomiting).   simvastatin 20 MG tablet Commonly known as: ZOCOR Take 20 mg by mouth in the morning.   Vitamin D 50 MCG (2000 UT) tablet Take 4,000 Units by mouth in the morning.               Home Infusion Instuctions  (From admission, onward)           Start     Ordered   02/27/21 0000  Home infusion instructions       Question:  Instructions  Answer:  Flushing of vascular access device: 0.9% NaCl pre/post medication administration and prn patency; Heparin 100 u/ml, 96ml for implanted ports and Heparin 10u/ml, 61ml for all other central venous catheters.   02/27/21 1706            No Known Allergies  Consultations: ID Neuro surg  Cardio    Procedures/Studies: DG Chest 2 View  Result Date: 02/14/2021 CLINICAL DATA:  Abnormal chest x-ray, weak, hyponatremia EXAM: CHEST - 2 VIEW COMPARISON:  02/14/2021, 12/31/2020 FINDINGS: Frontal and lateral views of the chest demonstrates stable right chest wall port. Cardiac silhouette is unremarkable. Patient is slightly rotated to the right, accentuating the vascular markings at the right lung base. No airspace disease identified on lateral view. No effusion or pneumothorax. No acute bony abnormalities. IMPRESSION: 1. No acute airspace disease. 2. Densities in the medial right lung base on prior exam correspond to prominent vascular shadows and superimposed structures on this study. Electronically  Signed   By: Randa Ngo M.D.   On: 02/14/2021 18:42   MR Lumbar Spine W Wo Contrast  Result Date: 02/22/2021 CLINICAL DATA:  Persistent bacteremia EXAM: MRI LUMBAR SPINE WITHOUT AND WITH CONTRAST TECHNIQUE: Multiplanar and multiecho pulse sequences of the lumbar spine were obtained without and with intravenous contrast. CONTRAST:  37mL GADAVIST GADOBUTROL 1 MMOL/ML IV SOLN COMPARISON:  None. FINDINGS: Segmentation:  Standard. Alignment: Levocurvature of the lumbar spine. 3 mm retrolisthesis of T12 on L1 and L1 on L2. 5 mm anterolisthesis L4 on L5. Exaggeration of the normal lumbar lordosis. Vertebrae:  No acute fracture or suspicious osseous lesion. Conus medullaris and cauda equina: Inferior to the right L1 transverse process, there is an area of enhancement and edema with a small rim enhancing collection, likely abscess (series 16, image 1 and series 7, image 1), which measures up to 12 mm. This does appear to track into the right L1-L2 neural foramen (series 16, image 4, with some dural and epidural inflammation along the right aspect of the thecal sac at L1-L2. This causes moderate thecal sac narrowing and severe right neural foraminal narrowing. The area of dural and epidural inflammation measures approximately 1.7 x 0.4 x 2.9 cm (AP x TR x CC) (series 17, image 12 and series 16, image 7); however no intraspinal abscess is seen. Conus extends to the L1 level and is otherwise unremarkable. No abnormal nerve enhancement. Paraspinal and other soft tissues: Edema in the paraspinous musculature. Disc levels: T12-L1: Moderate disc bulge. No spinal canal stenosis or neural foraminal narrowing. L1-L2: Retrolisthesis and moderate disc bulge. Moderate thecal sac narrowing. Mild left and severe right neural foraminal narrowing. L2-L3: Mild disc bulge. Mild facet arthropathy. No spinal canal stenosis or neural foraminal narrowing. Narrowing of the lateral recesses. L3-L4: Mild disc bulge. Mild facet arthropathy. No  spinal canal stenosis or neural foraminal narrowing. Narrowing of the lateral recesses. L4-L5: Mild anterolisthesis with disc unroofing. Severe right-greater-than-left facet arthropathy. No spinal canal stenosis or neural foraminal narrowing. L5-S1: Mild disc bulge and moderate to severe facet arthropathy. No spinal canal stenosis. Mild left neural foraminal narrowing. IMPRESSION: 1. 12 mm rim enhancing collection, likely abscess, in the paraspinal soft tissues just inferior to the L1 transverse process, with surrounding inflammation tracking into the right neural foramen and along the right aspect of the thecal sac, with dural and epidural inflammation, causing moderate thecal sac narrowing and severe right neural foraminal narrowing at this level. No intraspinal abscess. 2. No evidence of discitis or osteomyelitis. Electronically Signed   By: Merilyn Baba M.D.   On: 02/22/2021 04:21   MR HIP RIGHT W CONTRAST  Result Date: 02/22/2021 CLINICAL DATA:  Right hip pain, evaluate for septic arthritis. EXAM: MRI OF THE RIGHT HIP WITH CONTRAST TECHNIQUE: Multiplanar, multisequence MR imaging was performed following the administration of intravenous contrast. CONTRAST:  57mL GADAVIST GADOBUTROL 1 MMOL/ML IV SOLN COMPARISON:  None. FINDINGS: Bones: No hip fracture, dislocation or avascular necrosis. No periosteal reaction or bone destruction. No aggressive osseous lesion. Normal sacrum and sacroiliac joints. No SI joint widening or erosive changes. Mild degenerative changes of the pubic symphysis. Articular cartilage and labrum Articular cartilage:  No chondral defect. Labrum:  Small right anterior labral tear. Joint or bursal effusion Joint effusion:  No hip joint effusion.  No SI joint effusion. Bursae:  No bursa formation. Muscles and tendons Flexors: Normal. Extensors: Normal. Abductors: Normal. Adductors: Muscle edema in the right obturator muscle at the insertion at the ischial tuberosity with enhancement on the  postcontrast images which may reflect mild muscle strain versus myositis secondary to an infectious or inflammatory etiology. No intramuscular fluid collection. Gluteals: On the MRI of the hip dated 02/14/2021 there is mild edema in the periphery of the left gluteus maximus muscle adjacent to the left greater trochanter which may reflect muscle strain versus myositis. Hamstrings: Normal. Other findings No pelvic free fluid. No  fluid collection or hematoma. No inguinal lymphadenopathy. No inguinal hernia. IMPRESSION: 1. No evidence of septic arthritis. 2. Right anterior labral tear. 3. Muscle edema in the right obturator muscle at the insertion at the ischial tuberosity with enhancement on the postcontrast images which may reflect mild muscle strain versus myositis secondary to an infectious or inflammatory etiology. No intramuscular fluid collection to suggest an abscess. 4. On the MRI of the hip dated 02/14/2021 (this area is not included on the current MRI), there is mild edema in the periphery of the left gluteus maximus muscle adjacent to the left greater trochanter which may reflect muscle strain versus myositis. Electronically Signed   By: Kathreen Devoid M.D.   On: 02/22/2021 07:30   MR HIP RIGHT WO CONTRAST  Result Date: 02/14/2021 CLINICAL DATA:  Hip pain for 1 week, pain with ambulation EXAM: MR OF THE RIGHT HIP WITHOUT CONTRAST TECHNIQUE: Multiplanar, multisequence MR imaging was performed. No intravenous contrast was administered. COMPARISON:  02/14/2021 FINDINGS: Bones: There are no acute or destructive bony lesions. No abnormal signal. Articular cartilage and labrum Articular cartilage:  No gross abnormalities. Labrum:  Limited visualization. Joint or bursal effusion Joint effusion:  Trace symmetrical bilateral hip effusions. Bursae: There is a small amount of fluid medial to the right ischial tuberosity near the insertion of the obturator internus muscle, which may reflect bursitis. Muscles and  tendons Muscles and tendons: Minimal edema within the insertion of the right obturator internus muscle near the ischial tuberosity. Fluid signal along the shield tuberosities may also reflect superimposed bursitis. Remaining muscular structures are unremarkable. Other findings Miscellaneous:   Visualized intrapelvic structures are unremarkable. IMPRESSION: 1. Minimal fluid signal within the insertion of the right obturator internus muscle, as well as along the medial margin of the right ischial tuberosity. Findings consistent with muscular strain and associated obturator internus bursitis. 2. No acute or destructive bony lesions. Electronically Signed   By: Randa Ngo M.D.   On: 02/14/2021 20:25   PERIPHERAL VASCULAR CATHETERIZATION  Result Date: 02/18/2021 See surgical note for result.  DG Chest Portable 1 View  Result Date: 02/14/2021 CLINICAL DATA:  Weak and hyponatremic EXAM: PORTABLE CHEST 1 VIEW.  Patient is slightly rotated. COMPARISON:  Chest x-ray 12/31/2020. FINDINGS: Right chest wall Port-A-Cath with tip overlying the expected region of the distal superior vena cava just proximal to the superior cavoatrial junction. Poorly visualized right heart border. Otherwise the heart and mediastinal contours are unchanged. Aortic calcification. Query right middle lobe opacity. No pulmonary edema. No pleural effusion. No pneumothorax. No acute osseous abnormality. IMPRESSION: 1. Query right middle lobe opacity. Recommend repeat PA and lateral view of the chest. 2.  Aortic Atherosclerosis (ICD10-I70.0). Electronically Signed   By: Iven Finn M.D.   On: 02/14/2021 17:59   ECHOCARDIOGRAM COMPLETE  Result Date: 02/17/2021    ECHOCARDIOGRAM REPORT   Patient Name:   MANVI GUILLIAMS Date of Exam: 02/17/2021 Medical Rec #:  [id number]      Height:       61.0 in Accession #:    [id number]     Weight:       185.0 lb Date of Birth:  12/31/1950       BSA:          1.827 m Patient Age:    41 years       BP:            158/87 mmHg Patient Gender: F  HR:           113 bpm. Exam Location:  [facility] Procedure: 2D Echo, Color Doppler and Cardiac Doppler Indications:     R78.81 Bacteremia  History:         Patient has prior history of Echocardiogram examinations, most                  recent 01/01/2021. Risk Factors:Hypertension, Diabetes and HCL.  Sonographer:     Humphrey Rolls Referring Phys:  [id number] Tresa Endo A GRIFFITH Diagnosing Phys: Lorine Bears MD  Sonographer Comments: Suboptimal apical window and no subcostal window. Image acquisition challenging due to patient body habitus. IMPRESSIONS  1. Left ventricular ejection fraction, by estimation, is 60 to 65%. The left ventricle has normal function. The left ventricle has no regional wall motion abnormalities. There is mild left ventricular hypertrophy. Left ventricular diastolic parameters are consistent with Grade I diastolic dysfunction (impaired relaxation).  2. Right ventricular systolic function is normal. The right ventricular size is normal. Tricuspid regurgitation signal is inadequate for assessing PA pressure.  3. The mitral valve is normal in structure. No evidence of mitral valve regurgitation. No evidence of mitral stenosis.  4. The aortic valve is normal in structure. Aortic valve regurgitation is not visualized. No aortic stenosis is present.  5. No clear vegetations but overall suboptimal study. FINDINGS  Left Ventricle: Left ventricular ejection fraction, by estimation, is 60 to 65%. The left ventricle has normal function. The left ventricle has no regional wall motion abnormalities. The left ventricular internal cavity size was normal in size. There is  mild left ventricular hypertrophy. Left ventricular diastolic parameters are consistent with Grade I diastolic dysfunction (impaired relaxation). Right Ventricle: The right ventricular size is normal. No increase in right ventricular wall thickness. Right ventricular systolic function is normal. Tricuspid  regurgitation signal is inadequate for assessing PA pressure. Left Atrium: Left atrial size was normal in size. Right Atrium: Right atrial size was normal in size. Pericardium: There is no evidence of pericardial effusion. Mitral Valve: The mitral valve is normal in structure. No evidence of mitral valve regurgitation. No evidence of mitral valve stenosis. MV peak gradient, 6.7 mmHg. The mean mitral valve gradient is 4.0 mmHg. Tricuspid Valve: The tricuspid valve is normal in structure. Tricuspid valve regurgitation is not demonstrated. No evidence of tricuspid stenosis. Aortic Valve: The aortic valve is normal in structure. Aortic valve regurgitation is not visualized. No aortic stenosis is present. Aortic valve mean gradient measures 6.0 mmHg. Aortic valve peak gradient measures 11.6 mmHg. Aortic valve area, by VTI measures 1.95 cm. Pulmonic Valve: The pulmonic valve was normal in structure. Pulmonic valve regurgitation is not visualized. No evidence of pulmonic stenosis. Aorta: The aortic root is normal in size and structure. Venous: The inferior vena cava was not well visualized. IAS/Shunts: No atrial level shunt detected by color flow Doppler.  LEFT VENTRICLE PLAX 2D LVIDd:         4.30 cm   Diastology LVIDs:         2.74 cm   LV e' medial:    7.29 cm/s LV PW:         1.09 cm   LV E/e' medial:  11.6 LV IVS:        0.78 cm   LV e' lateral:   9.57 cm/s LVOT diam:     1.80 cm   LV E/e' lateral: 8.9 LV SV:         47 LV SV Index:  26 LVOT Area:     2.54 cm  RIGHT VENTRICLE RV Basal diam:  3.77 cm LEFT ATRIUM           Index LA diam:      2.90 cm 1.59 cm/m LA Vol (A4C): 37.1 ml 20.30 ml/m  AORTIC VALVE                     PULMONIC VALVE AV Area (Vmax):    1.93 cm      PV Vmax:       1.18 m/s AV Area (Vmean):   1.94 cm      PV Vmean:      78.200 cm/s AV Area (VTI):     1.95 cm      PV VTI:        0.188 m AV Vmax:           170.00 cm/s   PV Peak grad:  5.6 mmHg AV Vmean:          112.000 cm/s  PV Mean grad:   3.0 mmHg AV VTI:            0.242 m AV Peak Grad:      11.6 mmHg AV Mean Grad:      6.0 mmHg LVOT Vmax:         129.00 cm/s LVOT Vmean:        85.200 cm/s LVOT VTI:          0.185 m LVOT/AV VTI ratio: 0.76  AORTA Ao Root diam: 3.00 cm MITRAL VALVE MV Area (PHT): 6.32 cm     SHUNTS MV Area VTI:   2.27 cm     Systemic VTI:  0.18 m MV Peak grad:  6.7 mmHg     Systemic Diam: 1.80 cm MV Mean grad:  4.0 mmHg MV Vmax:       1.29 m/s MV Vmean:      88.8 cm/s MV Decel Time: 120 msec MV E velocity: 84.80 cm/s MV A velocity: 114.00 cm/s MV E/A ratio:  0.74 Kathlyn Sacramento MD Electronically signed by Kathlyn Sacramento MD Signature Date/Time: 02/17/2021/1:06:03 PM    Final    ECHO TEE  Result Date: 02/19/2021    TRANSESOPHOGEAL ECHO REPORT   Patient Name:   Vonita Moss Date of Exam: 02/19/2021 Medical Rec #:  [id number]      Height:       61.0 in Accession #:    [id number]     Weight:       185.0 lb Date of Birth:  1950/07/14       BSA:          1.827 m Patient Age:    4 years       BP:           98/66 mmHg Patient Gender: F              HR:           66 bpm. Exam Location:  [facility] Procedure: Transesophageal Echo  History:        Patient has no prior history of Echocardiogram examinations.                 Signs/Symptoms:Bacteremia.  Sonographer:    JERRY Referring Phys: [id number] [place]: The transesophogeal probe was passed without difficulty through the esophogus of the patient. Sedation performed by performing physician. The patient developed no complications during the procedure. IMPRESSIONS  1. Left  ventricular ejection fraction, by estimation, is 60 to 65%. The left ventricle has normal function.  2. Right ventricular systolic function is normal. The right ventricular size is normal.  3. No left atrial/left atrial appendage thrombus was detected.  4. The mitral valve is normal in structure. Mild mitral valve regurgitation.  5. The aortic valve is normal in structure. Aortic valve regurgitation is not  visualized.  6. Agitated saline contrast bubble study was negative, with no evidence of any interatrial shunt. FINDINGS  Left Ventricle: Left ventricular ejection fraction, by estimation, is 60 to 65%. The left ventricle has normal function. The left ventricular internal cavity size was normal in size. Right Ventricle: The right ventricular size is normal. No increase in right ventricular wall thickness. Right ventricular systolic function is normal. Left Atrium: Left atrial size was normal in size. No left atrial/left atrial appendage thrombus was detected. Right Atrium: Right atrial size was normal in size. Pericardium: There is no evidence of pericardial effusion. Mitral Valve: The mitral valve is normal in structure. Mild mitral valve regurgitation. There is no evidence of mitral valve vegetation. Tricuspid Valve: The tricuspid valve is normal in structure. Tricuspid valve regurgitation is trivial. There is no evidence of tricuspid valve vegetation. Aortic Valve: The aortic valve is normal in structure. Aortic valve regurgitation is not visualized. There is no evidence of aortic valve vegetation. Pulmonic Valve: The pulmonic valve was normal in structure. Pulmonic valve regurgitation is trivial. There is no evidence of pulmonic valve vegetation. Aorta: The aortic root and ascending aorta are structurally normal, with no evidence of dilitation. IAS/Shunts: No atrial level shunt detected by color flow Doppler. Agitated saline contrast bubble study was negative, with no evidence of any interatrial shunt. There is no evidence of a patent foramen ovale. There is no evidence of an atrial septal defect. Serafina Royals MD Electronically signed by Serafina Royals MD Signature Date/Time: 02/19/2021/12:32:42 PM    Final    DG Hip Unilat W or Wo Pelvis 2-3 Views Right  Result Date: 02/14/2021 CLINICAL DATA:  Right hip pain EXAM: DG HIP (WITH OR WITHOUT PELVIS) 2-3V RIGHT COMPARISON:  None. FINDINGS: No recent fracture or  dislocation is seen. Degenerative changes are noted with small bony spurs. Degenerative changes are noted in the pubic symphysis and visualized lower lumbar spine. IMPRESSION: No recent fracture or dislocation is seen in the right hip. Degenerative changes with small bony spurs are noted in the right hip. Electronically Signed   By: Elmer Picker M.D.   On: 02/14/2021 15:39   [place] EKG SITE RITE  Result Date: 02/26/2021 If Site Rite image not attached, placement could not be confirmed due to current cardiac rhythm.  (Echo, Carotid, EGD, Colonoscopy, ERCP)    Subjective:   Discharge Exam: Vitals:   02/27/21 1947 02/28/21 0402  BP: (!) 168/87 (!) 180/80  Pulse: 92 88  Resp: 18   Temp: 99.3 F (37.4 C) 98.1 F (36.7 C)  SpO2: 98% 98%   Vitals:   02/27/21 1151 02/27/21 1555 02/27/21 1947 02/28/21 0402  BP: (!) 196/90 (!) 188/80 (!) 168/87 (!) 180/80  Pulse: 98 95 92 88  Resp: $Remo'18 18 18   'ZQxRo$ Temp: 97.8 F (36.6 C) 99.3 F (37.4 C) 99.3 F (37.4 C) 98.1 F (36.7 C)  TempSrc:      SpO2: 99% 98% 98% 98%  Weight:      Height:        General: Pt is alert, awake, not in acute distress Cardiovascular: RRR,  S1/S2 +, no rubs, no gallops Respiratory: CTA bilaterally, no wheezing, no rhonchi Abdominal: Soft, NT, ND, bowel sounds + Extremities: no edema, no cyanosis    The results of significant diagnostics from this hospitalization (including imaging, microbiology, ancillary and laboratory) are listed below for reference.     Microbiology: Recent Results (from the past 240 hour(s))  Cath Tip Culture     Status: Abnormal   Collection Time: 02/18/21 10:49 AM   Specimen: Porta Cath; Other  Result Value Ref Range Status   Specimen Description   Final    PORTA CATH Performed at [facility], [address] [place], [place] [address]    Special Requests   Final    NONE Performed at [facility], [place], [place] [address]    Culture 20,000  COLONIES/mL STAPHYLOCOCCUS AUREUS (A)  Final   Report Status 02/21/2021 FINAL  Final   Organism ID, Bacteria STAPHYLOCOCCUS AUREUS (A)  Final      Susceptibility   Staphylococcus aureus - MIC*    CIPROFLOXACIN <=0.5 SENSITIVE Sensitive     ERYTHROMYCIN >=8 RESISTANT Resistant     GENTAMICIN <=0.5 SENSITIVE Sensitive     OXACILLIN 0.5 SENSITIVE Sensitive     TETRACYCLINE <=1 SENSITIVE Sensitive     VANCOMYCIN 1 SENSITIVE Sensitive     TRIMETH/SULFA <=10 SENSITIVE Sensitive     CLINDAMYCIN RESISTANT Resistant     RIFAMPIN <=0.5 SENSITIVE Sensitive     Inducible Clindamycin POSITIVE Resistant     * 20,000 COLONIES/mL STAPHYLOCOCCUS AUREUS  CULTURE, BLOOD (ROUTINE X 2) w Reflex to ID Panel     Status: None (Preliminary result)   Collection Time: 02/20/21  3:43 AM   Specimen: BLOOD  Result Value Ref Range Status   Specimen Description BLOOD RIGHT HAND  Final   Special Requests   Final    BOTTLES DRAWN AEROBIC AND ANAEROBIC Blood Culture adequate volume   Culture   Final    NO GROWTH 4 DAYS Performed at [facility], [address], [place], [place] [address]    Report Status PENDING  Incomplete  CULTURE, BLOOD (ROUTINE X 2) w Reflex to ID Panel     Status: Abnormal   Collection Time: 02/20/21  3:46 AM   Specimen: BLOOD  Result Value Ref Range Status   Specimen Description   Final    BLOOD RIGHT WRIST Performed at [facility], [address] [place], [place] [address]    Special Requests   Final    IN PEDIATRIC BOTTLE Blood Culture results may not be optimal due to an excessive volume of blood received in culture bottles Performed at [facility], [address] [place], [place] [address]    Culture  Setup Time   Final    GRAM POSITIVE COCCI IN PEDIATRIC BOTTLE CRITICAL RESULT CALLED TO, READ BACK BY AND VERIFIED WITH: Windy Fast 02/27/21 0913 MW Performed at [facility], [place] [address], [place], [place] [address]    Culture  STAPHYLOCOCCUS AUREUS (A)  Final   Report Status 02/23/2021 FINAL  Final   Organism ID, Bacteria STAPHYLOCOCCUS AUREUS  Final      Susceptibility   Staphylococcus aureus - MIC*    CIPROFLOXACIN <=0.5 SENSITIVE Sensitive     ERYTHROMYCIN >=8 RESISTANT Resistant     GENTAMICIN <=0.5 SENSITIVE Sensitive     OXACILLIN 0.5 SENSITIVE Sensitive     TETRACYCLINE <=1 SENSITIVE Sensitive     VANCOMYCIN 1 SENSITIVE Sensitive  TRIMETH/SULFA <=10 SENSITIVE Sensitive     CLINDAMYCIN RESISTANT Resistant     RIFAMPIN <=0.5 SENSITIVE Sensitive     Inducible Clindamycin POSITIVE Resistant     * STAPHYLOCOCCUS AUREUS  Blood Culture ID Panel (Reflexed)     Status: Abnormal   Collection Time: 02/20/21  3:46 AM  Result Value Ref Range Status   Enterococcus faecalis NOT DETECTED NOT DETECTED Final   Enterococcus Faecium NOT DETECTED NOT DETECTED Final   Listeria monocytogenes NOT DETECTED NOT DETECTED Final   Staphylococcus species DETECTED (A) NOT DETECTED Final    Comment: CRITICAL RESULT CALLED TO, READ BACK BY AND VERIFIED WITH: [facility] 02/27/21 0913 MW    Staphylococcus aureus (BCID) DETECTED (A) NOT DETECTED Final    Comment: CRITICAL RESULT CALLED TO, READ BACK BY AND VERIFIED WITH: SHEMA HALLAJI 02/27/21 0913 MW    Staphylococcus epidermidis NOT DETECTED NOT DETECTED Final   Staphylococcus lugdunensis NOT DETECTED NOT DETECTED Final   Streptococcus species NOT DETECTED NOT DETECTED Final   Streptococcus agalactiae NOT DETECTED NOT DETECTED Final   Streptococcus pneumoniae NOT DETECTED NOT DETECTED Final   Streptococcus pyogenes NOT DETECTED NOT DETECTED Final   A.calcoaceticus-baumannii NOT DETECTED NOT DETECTED Final   Bacteroides fragilis NOT DETECTED NOT DETECTED Final   Enterobacterales NOT DETECTED NOT DETECTED Final   Enterobacter cloacae complex NOT DETECTED NOT DETECTED Final   Escherichia coli NOT DETECTED NOT DETECTED Final   Klebsiella aerogenes NOT DETECTED NOT DETECTED Final    Klebsiella oxytoca NOT DETECTED NOT DETECTED Final   Klebsiella pneumoniae NOT DETECTED NOT DETECTED Final   Proteus species NOT DETECTED NOT DETECTED Final   Salmonella species NOT DETECTED NOT DETECTED Final   Serratia marcescens NOT DETECTED NOT DETECTED Final   Haemophilus influenzae NOT DETECTED NOT DETECTED Final   Neisseria meningitidis NOT DETECTED NOT DETECTED Final   Pseudomonas aeruginosa NOT DETECTED NOT DETECTED Final   Stenotrophomonas maltophilia NOT DETECTED NOT DETECTED Final   Candida albicans NOT DETECTED NOT DETECTED Final   Candida auris NOT DETECTED NOT DETECTED Final   Candida glabrata NOT DETECTED NOT DETECTED Final   Candida krusei NOT DETECTED NOT DETECTED Final   Candida parapsilosis NOT DETECTED NOT DETECTED Final   Candida tropicalis NOT DETECTED NOT DETECTED Final   Cryptococcus neoformans/gattii NOT DETECTED NOT DETECTED Final   Meth resistant mecA/C and MREJ NOT DETECTED NOT DETECTED Final    Comment: Performed at [facility], [address] [place], [place] [address]  CULTURE, BLOOD (ROUTINE X 2) w Reflex to ID Panel     Status: None (Preliminary result)   Collection Time: 02/24/21  4:18 AM   Specimen: BLOOD  Result Value Ref Range Status   Specimen Description BLOOD RIGHT HAND  Final   Special Requests   Final    BOTTLES DRAWN AEROBIC AND ANAEROBIC Blood Culture adequate volume   Culture   Final    NO GROWTH 3 DAYS Performed at [facility], [address] [place], [place] [address]    Report Status PENDING  Incomplete  CULTURE, BLOOD (ROUTINE X 2) w Reflex to ID Panel     Status: None (Preliminary result)   Collection Time: 02/24/21  4:30 AM   Specimen: BLOOD  Result Value Ref Range Status   Specimen Description BLOOD RIGHT WRIST  Final   Special Requests   Final    BOTTLES DRAWN AEROBIC AND ANAEROBIC Blood Culture adequate volume   Culture   Final    NO GROWTH 3  DAYS Performed at [facility], [address] [place], [place] [address]    Report Status PENDING  Incomplete     Labs: BNP (last 3 results) No results for input(s): BNP in the last 8760 hours. Basic Metabolic Panel: Recent Labs  Lab 02/22/21 0425 02/24/21 0430 02/26/21 1119 02/27/21 0334 02/28/21 0458  NA 130* 133* 134* 134* 132*  K 4.2 4.3 4.0 3.9 3.8  CL 98 100 98 100 99  CO2 $Re'27 24 27 26 27  'eOj$ GLUCOSE 154* 187* 220* 171* 162*  BUN $Re'12 18 16 15 12  'kdw$ CREATININE 0.60 0.68 0.58 0.62 0.59  CALCIUM 8.4* 8.4* 8.3* 8.2* 8.3*   Liver Function Tests: Recent Labs  Lab 02/22/21 0425 02/24/21 0430 02/26/21 1119  AST 58* 40 19  ALT 65* 59* 33  ALKPHOS 75 66 66  BILITOT 0.3 0.5 0.6  PROT 7.2 7.1 6.9  ALBUMIN 2.2* 2.3* 2.1*   No results for input(s): LIPASE, AMYLASE in the last 168 hours. No results for input(s): AMMONIA in the last 168 hours. CBC: Recent Labs  Lab 02/22/21 0425 02/24/21 0430 02/26/21 1119 02/27/21 0334 02/28/21 0458  WBC 6.6 6.4 5.6 4.7 5.1  NEUTROABS  --   --  4.3  --   --   HGB 8.5* 8.3* 8.5* 8.2* 8.3*  HCT 26.3* 25.9* 26.8* 26.3* 26.5*  MCV 93.3 94.9 95.7 94.9 93.0  PLT 417* 411* 391 391 346   Cardiac Enzymes: No results for input(s): CKTOTAL, CKMB, CKMBINDEX, TROPONINI in the last 168 hours. BNP: Invalid input(s): POCBNP CBG: Recent Labs  Lab 02/26/21 2055 02/27/21 0758 02/27/21 1147 02/27/21 1528 02/27/21 2050  GLUCAP 142* 146* 180* 153* 194*   D-Dimer No results for input(s): DDIMER in the last 72 hours. Hgb A1c No results for input(s): HGBA1C in the last 72 hours. Lipid Profile No results for input(s): CHOL, HDL, LDLCALC, TRIG, CHOLHDL, LDLDIRECT in the last 72 hours. Thyroid function studies No results for input(s): TSH, T4TOTAL, T3FREE, THYROIDAB in the last 72 hours.  Invalid input(s): FREET3 Anemia work up No results for input(s): VITAMINB12, FOLATE, FERRITIN, TIBC, IRON, RETICCTPCT in the last 72 hours. Urinalysis    Component Value Date/Time   COLORURINE YELLOW  02/15/2021 0230   APPEARANCEUR CLEAR 02/15/2021 0230   LABSPEC 1.010 02/15/2021 0230   PHURINE 6.0 02/15/2021 0230   GLUCOSEU 100 (A) 02/15/2021 0230   HGBUR MODERATE (A) 02/15/2021 0230   BILIRUBINUR NEGATIVE 02/15/2021 0230   KETONESUR NEGATIVE 02/15/2021 0230   PROTEINUR 30 (A) 02/15/2021 0230   NITRITE POSITIVE (A) 02/15/2021 0230   LEUKOCYTESUR NEGATIVE 02/15/2021 0230   Sepsis Labs Invalid input(s): PROCALCITONIN,  WBC,  LACTICIDVEN Microbiology Recent Results (from the past 240 hour(s))  Cath Tip Culture     Status: Abnormal   Collection Time: 02/18/21 10:49 AM   Specimen: Porta Cath; Other  Result Value Ref Range Status   Specimen Description   Final    PORTA CATH Performed at [facility], [address] [place], [place] [address]    Special Requests   Final    NONE Performed at [facility], [place], [place] [address]    Culture 20,000 COLONIES/mL STAPHYLOCOCCUS AUREUS (A)  Final   Report Status 02/21/2021 FINAL  Final   Organism ID, Bacteria STAPHYLOCOCCUS AUREUS (A)  Final      Susceptibility   Staphylococcus aureus - MIC*    CIPROFLOXACIN <=0.5 SENSITIVE Sensitive     ERYTHROMYCIN >=8 RESISTANT Resistant  GENTAMICIN <=0.5 SENSITIVE Sensitive     OXACILLIN 0.5 SENSITIVE Sensitive     TETRACYCLINE <=1 SENSITIVE Sensitive     VANCOMYCIN 1 SENSITIVE Sensitive     TRIMETH/SULFA <=10 SENSITIVE Sensitive     CLINDAMYCIN RESISTANT Resistant     RIFAMPIN <=0.5 SENSITIVE Sensitive     Inducible Clindamycin POSITIVE Resistant     * 20,000 COLONIES/mL STAPHYLOCOCCUS AUREUS  CULTURE, BLOOD (ROUTINE X 2) w Reflex to ID Panel     Status: None (Preliminary result)   Collection Time: 02/20/21  3:43 AM   Specimen: BLOOD  Result Value Ref Range Status   Specimen Description BLOOD RIGHT HAND  Final   Special Requests   Final    BOTTLES DRAWN AEROBIC AND ANAEROBIC Blood Culture adequate volume   Culture   Final    NO GROWTH 4  DAYS Performed at [facility], [address], [place], [place] [address]    Report Status PENDING  Incomplete  CULTURE, BLOOD (ROUTINE X 2) w Reflex to ID Panel     Status: Abnormal   Collection Time: 02/20/21  3:46 AM   Specimen: BLOOD  Result Value Ref Range Status   Specimen Description   Final    BLOOD RIGHT WRIST Performed at [facility], [address], [place], [place] [address]    Special Requests   Final    IN PEDIATRIC BOTTLE Blood Culture results may not be optimal due to an excessive volume of blood received in culture bottles Performed at [facility], [address] [place], [place] [address]    Culture  Setup Time   Final    GRAM POSITIVE COCCI IN PEDIATRIC BOTTLE CRITICAL RESULT CALLED TO, READ BACK BY AND VERIFIED WITH: Gaylyn Cheers 02/27/21 0913 MW Performed at [facility], [address], [place], [place] [address]    Culture STAPHYLOCOCCUS AUREUS (A)  Final   Report Status 02/23/2021 FINAL  Final   Organism ID, Bacteria STAPHYLOCOCCUS AUREUS  Final      Susceptibility   Staphylococcus aureus - MIC*    CIPROFLOXACIN <=0.5 SENSITIVE Sensitive     ERYTHROMYCIN >=8 RESISTANT Resistant     GENTAMICIN <=0.5 SENSITIVE Sensitive     OXACILLIN 0.5 SENSITIVE Sensitive     TETRACYCLINE <=1 SENSITIVE Sensitive     VANCOMYCIN 1 SENSITIVE Sensitive     TRIMETH/SULFA <=10 SENSITIVE Sensitive     CLINDAMYCIN RESISTANT Resistant     RIFAMPIN <=0.5 SENSITIVE Sensitive     Inducible Clindamycin POSITIVE Resistant     * STAPHYLOCOCCUS AUREUS  Blood Culture ID Panel (Reflexed)     Status: Abnormal   Collection Time: 02/20/21  3:46 AM  Result Value Ref Range Status   Enterococcus faecalis NOT DETECTED NOT DETECTED Final   Enterococcus Faecium NOT DETECTED NOT DETECTED Final   Listeria monocytogenes NOT DETECTED NOT DETECTED Final   Staphylococcus species DETECTED (A) NOT DETECTED Final    Comment: CRITICAL RESULT CALLED TO, READ BACK  BY AND VERIFIED WITH: [facility] 02/27/21 0913 MW    Staphylococcus aureus (BCID) DETECTED (A) NOT DETECTED Final    Comment: CRITICAL RESULT CALLED TO, READ BACK BY AND VERIFIED WITH: SHEMA HALLAJI 02/27/21 0913 MW    Staphylococcus epidermidis NOT DETECTED NOT DETECTED Final   Staphylococcus lugdunensis NOT DETECTED NOT DETECTED Final   Streptococcus species NOT DETECTED NOT DETECTED Final   Streptococcus agalactiae NOT DETECTED NOT DETECTED Final   Streptococcus pneumoniae NOT DETECTED NOT DETECTED Final  Streptococcus pyogenes NOT DETECTED NOT DETECTED Final   A.calcoaceticus-baumannii NOT DETECTED NOT DETECTED Final   Bacteroides fragilis NOT DETECTED NOT DETECTED Final   Enterobacterales NOT DETECTED NOT DETECTED Final   Enterobacter cloacae complex NOT DETECTED NOT DETECTED Final   Escherichia coli NOT DETECTED NOT DETECTED Final   Klebsiella aerogenes NOT DETECTED NOT DETECTED Final   Klebsiella oxytoca NOT DETECTED NOT DETECTED Final   Klebsiella pneumoniae NOT DETECTED NOT DETECTED Final   Proteus species NOT DETECTED NOT DETECTED Final   Salmonella species NOT DETECTED NOT DETECTED Final   Serratia marcescens NOT DETECTED NOT DETECTED Final   Haemophilus influenzae NOT DETECTED NOT DETECTED Final   Neisseria meningitidis NOT DETECTED NOT DETECTED Final   Pseudomonas aeruginosa NOT DETECTED NOT DETECTED Final   Stenotrophomonas maltophilia NOT DETECTED NOT DETECTED Final   Candida albicans NOT DETECTED NOT DETECTED Final   Candida auris NOT DETECTED NOT DETECTED Final   Candida glabrata NOT DETECTED NOT DETECTED Final   Candida krusei NOT DETECTED NOT DETECTED Final   Candida parapsilosis NOT DETECTED NOT DETECTED Final   Candida tropicalis NOT DETECTED NOT DETECTED Final   Cryptococcus neoformans/gattii NOT DETECTED NOT DETECTED Final   Meth resistant mecA/C and MREJ NOT DETECTED NOT DETECTED Final    Comment: Performed at [facility], [address]  [place], [place] [address]  CULTURE, BLOOD (ROUTINE X 2) w Reflex to ID Panel     Status: None (Preliminary result)   Collection Time: 02/24/21  4:18 AM   Specimen: BLOOD  Result Value Ref Range Status   Specimen Description BLOOD RIGHT HAND  Final   Special Requests   Final    BOTTLES DRAWN AEROBIC AND ANAEROBIC Blood Culture adequate volume   Culture   Final    NO GROWTH 3 DAYS Performed at [facility], [address] [place], [place] [address]    Report Status PENDING  Incomplete  CULTURE, BLOOD (ROUTINE X 2) w Reflex to ID Panel     Status: None (Preliminary result)   Collection Time: 02/24/21  4:30 AM   Specimen: BLOOD  Result Value Ref Range Status   Specimen Description BLOOD RIGHT WRIST  Final   Special Requests   Final    BOTTLES DRAWN AEROBIC AND ANAEROBIC Blood Culture adequate volume   Culture   Final    NO GROWTH 3 DAYS Performed at [facility], [address] [place], [place] [address]    Report Status PENDING  Incomplete     Time coordinating discharge: Over 30 minutes  SIGNED:   Wyvonnia Dusky, MD  [facility] 02/28/2021, 6:43 AM Pager   If 7PM-7AM, please contact night-coverage

## 2021-02-27 NOTE — Progress Notes (Signed)
ID Looks well No fever  No specific complaints  O/e  Alert , no distress  Patient Vitals for the past 24 hrs:  BP Temp Temp src Pulse Resp SpO2  02/27/21 1151 (!) 196/90 97.8 F (36.6 C) -- 98 18 99 %  02/27/21 0802 (!) 179/78 98 F (36.7 C) -- 85 18 96 %  02/27/21 0346 135/79 97.9 F (36.6 C) -- 86 14 96 %  02/27/21 0012 (!) 152/79 99.4 F (37.4 C) -- 87 16 97 %  02/26/21 2013 138/72 98 F (36.7 C) Oral 85 14 99 %  02/26/21 1540 131/68 98.3 F (36.8 C) -- 88 16 99 %    Removed port - looks well Chest b/l air entry [id number] Abd soft CNS grossly non focal PICC in place   Labs CBC Latest Ref Rng & Units 02/27/2021 02/26/2021 02/24/2021  WBC 4.0 - 10.5 K/uL 4.7 5.6 6.4  Hemoglobin 12.0 - 15.0 g/dL 8.2(L) 8.5(L) 8.3(L)  Hematocrit 36.0 - 46.0 % 26.3(L) 26.8(L) 25.9(L)  Platelets 150 - 400 K/uL 391 391 411(H)    CMP Latest Ref Rng & Units 02/27/2021 02/26/2021 02/24/2021  Glucose 70 - 99 mg/dL 171(H) 220(H) 187(H)  BUN 8 - 23 mg/dL $Remove'15 16 18  'GZyYWKg$ Creatinine 0.44 - 1.00 mg/dL 0.62 0.58 0.68  Sodium 135 - 145 mmol/L 134(L) 134(L) 133(L)  Potassium 3.5 - 5.1 mmol/L 3.9 4.0 4.3  Chloride 98 - 111 mmol/L 100 98 100  CO2 22 - 32 mmol/L $RemoveB'26 27 24  'jmIWwEts$ Calcium 8.9 - 10.3 mg/dL 8.2(L) 8.3(L) 8.4(L)  Total Protein 6.5 - 8.1 g/dL - 6.9 7.1  Total Bilirubin 0.3 - 1.2 mg/dL - 0.6 0.5  Alkaline Phos 38 - 126 U/L - 66 66  AST 15 - 41 U/L - 19 40  ALT 0 - 44 U/L - 33 59(H)    Micro 02/14/21 BC- MSSA 02/17/21 BC MSSA 02/18/21 Cath tip 2/2/BC 1 of 4 positive 02/24/21- BC NG  Impression/recommendation MSSA bacteremia  due to infected port - high bioburden repeat blood culture from 1/30 was positive as well.  PORT removed 02/18/21- cath tip positive for MSSA  repeat blood culture from 2/2 is still positive TEE NO endocarditis  cefazolin changed to nafcillin on 02/21/2021. Negative blood culture 02/24/21- so PICC  placed Will need minimum  6 weeks of IV nafcillin Need to make sure that SNF/[facility] can dose her  properly - 2 options- q4hr dosing or 24hr continuous infusion.    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  No evidence of discitis or osteomyelitis. Some amount of muscle edema in the right obturator muscle at the insertion of the ischial tuberosity with enhancement on the postcontrast images. Patient was discussed with neurosurgeon and no role for surgery currently. No abscess big enough for anything kind of aspiration even by IR.    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 crew of pharmacy) . They both assured  me that it could be done as a continuous infusion- the other option discussed was 2 grms IV q4 .  IF she develops any  allergic reaction or intolerance to nafcillin they will contact  ID  Also spoke to her cousins  and informed them about the care plan     OPAT Orders Discharge antibiotics: Nafcillin 12 grams in 500cc saline continuous every 24 hour infusion ( The other option would be Nafcillin 2 grams IV every 4 hours with no missed doses)   End Date: 04/07/21     [facility] Per Protocol:   Labs weekly  mondaywhile on IV antibiotics: _X_ CBC with differential   _X_ CMP   X_ ESR   _X_ Please pull PIC at completion of IV antibiotics _   Fax weekly labs to  [place] [phone]   Clinic Follow Up Appt: 04/10/21 at 9.15 AM     Call [phone] if any concerns with IV antibiotics, side effects from nafcillin, abnormal labs and on completion of antibiotic and discharge

## 2021-02-27 NOTE — Progress Notes (Signed)
Physical Therapy Treatment Patient Details Name: Tamara Mejia MRN: [id number] DOB: 10/18/1950 Today's Date: 02/27/2021   History of Present Illness Pt is a 71 y/o F admitted on 02/14/21 with c/c of R hip pain. Pt is admitted for work-up. MRI findings consistent with muscular strain and  associated obturator internus bursitis. Blood cultures positive for MSSA. 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 Pt demonstrated good tolerance for long distance gait and support of RW. Repeated vc's to stay inside walker and encourage upright stance if able. Pt remains very motivated to improve and return home, possible d/c to SNF today to attain highest level of functional independence and finish ABT   Recommendations for follow up therapy are one component of a multi-disciplinary discharge planning process, led by the attending physician.  Recommendations may be updated based on patient status, additional functional criteria and insurance authorization.  Follow Up Recommendations  PT at Long-term acute care hospital     Assistance Recommended at Discharge Intermittent Supervision/Assistance  Patient can return home with the following A little help with walking and/or transfers;A little help with bathing/dressing/bathroom;Help with stairs or ramp for entrance;Direct supervision/assist for medications management;Assistance with cooking/housework;Direct supervision/assist for financial management;Assist for transportation   Equipment Recommendations  Rolling walker (2 wheels);BSC/3in1    Recommendations for Other Services Rehab consult     Precautions / Restrictions Precautions Precautions: Fall Restrictions Weight Bearing Restrictions: No     Mobility  Bed Mobility                General bed mobility comments: NT, up in recliner at start and end of session    Transfers Overall transfer level: Needs assistance Equipment used: Rolling walker (2 wheels) Transfers: Sit to/from Stand Sit to Stand: Min guard, Min assist           General transfer comment: Requires VC for hand placement. MIN GUARD from recliner, MIN A from toilet (d/t low height)    Ambulation/Gait Ambulation/Gait assistance: Min guard Gait Distance (Feet): 190 Feet Assistive device: Rolling walker (2 wheels) Gait Pattern/deviations: Decreased step length - right, Decreased step length - left, Trunk flexed, Step-through pattern           Stairs             Wheelchair Mobility    Modified [place] (Stroke Patients Only)       Balance                                            Cognition                                                Exercises      General Comments        Pertinent Vitals/Pain Pain Assessment Pain Assessment: No/denies pain    Home Living                          Prior Function  PT Goals (current goals can now be found in the care plan section) Acute Rehab PT Goals Patient Stated Goal: to return home    Frequency    Min 2X/week      PT Plan Discharge plan needs to be updated    Co-evaluation              AM-PAC PT "6 Clicks" Mobility   Outcome Measure  Help needed turning from your back to your side while in a flat bed without using bedrails?: A Little Help needed moving from lying on your back to sitting on the side of a flat bed without using bedrails?: A Little Help needed moving to and from a bed to a chair (including a wheelchair)?: A Little Help needed standing up from a chair using your arms (e.g., wheelchair or bedside chair)?: A Little Help needed to walk in hospital room?: A Little Help needed climbing 3-5 steps with a railing? : A  Lot 6 Click Score: 17    End of Session Equipment Utilized During Treatment: Gait belt Activity Tolerance: Patient tolerated treatment well;No increased pain Patient left: in chair;with call bell/phone within reach;with chair alarm set Nurse Communication: Mobility status PT Visit Diagnosis: Muscle weakness (generalized) (M62.81);Difficulty in walking, not elsewhere classified (R26.2);Unsteadiness on feet (R26.81)     Time: 1140-1155 PT Time Calculation (min) (ACUTE ONLY): 15 min  Charges:  $Therapeutic Activity: 8-22 mins          Mikel Cella, PTA   Josie Dixon 02/27/2021, 11:59 AM

## 2021-02-27 NOTE — Progress Notes (Signed)
Peripherally Inserted Central Catheter Placement  The IV Nurse has discussed with the patient and/or persons authorized to consent for the patient, the purpose of this procedure and the potential benefits and risks involved with this procedure.  The benefits include less needle sticks, lab draws from the catheter, and the patient may be discharged home with the catheter. Risks include, but not limited to, infection, bleeding, blood clot (thrombus formation), and puncture of an artery; nerve damage and irregular heartbeat and possibility to perform a PICC exchange if needed/ordered by physician.  Alternatives to this procedure were also discussed.  Bard Power PICC patient education guide, fact sheet on infection prevention and patient information card has been provided to patient /or left at bedside.  Telephone consent obtained by RN from legal guardian.  PICC Placement Documentation  PICC Single Lumen 02/27/21 Right Brachial 37 cm 0 cm (Active)  Indication for Insertion or Continuance of Line Home intravenous therapies (PICC only) 02/27/21 1057  Exposed Catheter (cm) 0 cm 02/27/21 1057  Site Assessment Clean;Dry;Intact 02/27/21 1057  Line Status Flushed;Saline locked;Blood return noted 02/27/21 1057  Dressing Type Transparent;Securing device 02/27/21 1057  Dressing Status Clean;Dry;Intact 02/27/21 1057  Antimicrobial disc in place? Yes 02/27/21 1057  Safety Lock Not Applicable 34/19/62 2297  Line Care Connections checked and tightened 02/27/21 1057  Line Adjustment (NICU/IV Team Only) No 02/27/21 1057  Dressing Intervention New dressing 02/27/21 1057  Dressing Change Due 03/06/21 02/27/21 1057       Rolena Infante 02/27/2021, 10:58 AM

## 2021-02-27 NOTE — TOC Progression Note (Addendum)
Transition of Care [facility]) - Progression Note    Patient Details  Name: Tamara Mejia MRN: [id number] Date of Birth: 1950-08-01  Transition of Care [facility]) CM/SW [place], RN Phone Number: 02/27/2021, 1:06 PM  Clinical Narrative:   called [facility] transport to schedule transport to [facility] in [place], left a vm for a call back, I spoke with Jenny Reichmann and they have no availability today, they can transport at [place] and was not able to reach anyone [employer], they have no availability this week [facility] and the crew chief will call me to see if they can transport to [place] in [place] they have no availability this week [facility] Spoke with Merrilyn Puma and they have no availability this week [employer] transport they have no availability this week  [employer] and they have no availability this week  Called [facility] they have no availability this week [employer] transport and spoke with Colletta [place]  they have no availability this week   The patient is to DC to [facility] in [place] room [phone], the bedside nurse to call report o [phone] I notified the physician and Anderson Malta with [facility] that I am not able to get transport until tomorrow I called the patient's Legal Guardian  Tamara Mejia and let her know,  Myra Rude at [employer] at [facility] request and notified her, she will send someone to pick up the patient's belongings from the hospital that EMS will not transport   Expected Discharge Plan: [place] Barriers to Discharge: Continued Medical Work up  Expected Discharge Plan and Services Expected Discharge Plan: [place]                                               Social Determinants of Health (SDOH) Interventions    Readmission Risk Interventions No flowsheet data found.

## 2021-02-27 NOTE — Progress Notes (Signed)
PHARMACY CONSULT NOTE FOR:  OUTPATIENT  PARENTERAL ANTIBIOTIC THERAPY (OPAT)  Indication: MSSA bacteremia and paraspinal abscess Regimen: Nafcillin 12gm IV as continuous infusion over 24h  End date: 04/07/2021   IV antibiotic discharge orders are pended. To discharging provider:  please sign these orders via discharge navigator,  Select New Orders & click on the button choice - Manage This Unsigned Work.     Thank you for allowing pharmacy to be a part of this patient's care.  Doreene Eland, PharmD, BCPS, BCIDP Work Cell: [phone] 02/27/2021 2:45 PM

## 2021-02-27 NOTE — Progress Notes (Signed)
Occupational Therapy Treatment Patient Details Name: Tamara Mejia MRN: [id number] DOB: [birth year] Today's Date: 02/27/2021   History of present illness Pt is a 71 y/o F admitted on 02/14/21 with c/c of R hip pain. Pt is admitted for work-up. MRI findings consistent with muscular strain and  associated obturator internus bursitis. Blood cultures positive for MSSA. 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 Pt pleasant, agreeable, and requiring supervision for bed mobility and CGA for ADL transfers +RW and MIN VC for hand placement/RW mgt/safety. Pt went to the bathroom and required CGA and VC for pericare in standing. Pt left with PTA for additional mobility. Pt progressing towards goals, denies pain this date, and requiring less assist for ADL/mobility.    Recommendations for follow up therapy are one component of a multi-disciplinary discharge planning process, led by the attending physician.  Recommendations may be updated based on patient status, additional functional criteria and insurance authorization.    Follow Up Recommendations  Long-term institutional care without follow-up therapy    Assistance Recommended at Discharge Frequent or constant Supervision/Assistance  Patient can return home with the following  Direct supervision/assist for medications management;Assist for transportation;Assistance with cooking/housework;Help with stairs or ramp for entrance;A little help with walking and/or transfers;A lot of help with bathing/dressing/bathroom   Equipment Recommendations  BSC/3in1;Other (comment) (reacher, LH sponge)    Recommendations for Other Services      Precautions / Restrictions Precautions Precautions: Fall Restrictions Weight Bearing Restrictions: Yes RLE Weight Bearing: Weight bearing as tolerated        Mobility Bed Mobility Overal bed mobility: Needs Assistance Bed Mobility: Supine to Sit     Supine to sit: Supervision, HOB elevated     General bed mobility comments: increased time/effort    Transfers Overall transfer level: Needs assistance Equipment used: Rolling walker (2 wheels) Transfers: Sit to/from Stand Sit to Stand: Min guard           General transfer comment: VC hand placement/RW mgt     Balance Overall balance assessment: Needs assistance Sitting-balance support: No upper extremity supported, Feet supported Sitting balance-Leahy Scale: Good     Standing balance support: No upper extremity supported, During functional activity, Bilateral upper extremity supported Standing balance-Leahy Scale: Fair                             ADL either performed or assessed with clinical judgement   ADL Overall ADL's : Needs assistance/impaired     Grooming: Standing;Supervision/safety;Wash/dry hands;Min guard;Cueing for sequencing Grooming Details (indicate cue type and reason): cues to initiate                 Toilet Transfer: BSC/3in1;Regular Toilet;Rolling walker (2 wheels);Min guard Armed forces technical officer Details (indicate cue type and reason): VC for safety, BSC over toilet Toileting- Clothing Manipulation and Hygiene: Sit to/from stand;Min guard              Extremity/Trunk Assessment              Vision       Perception     Praxis      Cognition Arousal/Alertness: Awake/alert Behavior During Therapy: WFL for tasks assessed/performed Overall Cognitive Status: History of cognitive impairments - at baseline  Exercises      Shoulder Instructions       General Comments      Pertinent Vitals/ Pain       Pain Assessment Pain Assessment: No/denies pain  Home Living                                          Prior Functioning/Environment               Frequency  Min 3X/week        Progress Toward Goals  OT Goals(current goals can now be found in the care plan section)  Progress towards OT goals: Progressing toward goals  Acute Rehab OT Goals Patient Stated Goal: to go home OT Goal Formulation: With patient Time For Goal Achievement: 03/04/21 Potential to Achieve Goals: Good  Plan Frequency remains appropriate;Discharge plan remains appropriate    Co-evaluation                 AM-PAC OT "6 Clicks" Daily Activity     Outcome Measure   Help from another person eating meals?: None Help from another person taking care of personal grooming?: A Little Help from another person toileting, which includes using toliet, bedpan, or urinal?: A Little Help from another person bathing (including washing, rinsing, drying)?: A Lot Help from another person to put on and taking off regular upper body clothing?: A Little Help from another person to put on and taking off regular lower body clothing?: A Lot 6 Click Score: 17    End of Session Equipment Utilized During Treatment: Rolling walker (2 wheels);Gait belt  OT Visit Diagnosis: Other abnormalities of gait and mobility (R26.89)   Activity Tolerance Patient tolerated treatment well   Patient Left Other (comment) (standing with PTA for additional mobility)   Nurse Communication          Time: 1657-9038 OT Time Calculation (min): 15 min  Charges: OT General Charges $OT Visit: 1 Visit OT Treatments $Self Care/Home Management : 8-22 mins  Ardeth Perfect., MPH, MS, OTR/L ascom [phone] 02/27/21, 12:52 PM

## 2021-02-27 NOTE — Care Management Important Message (Signed)
Important Message  Patient Details  Name: LINET BRASH MRN: [id number] Date of Birth: [birth year]   Medicare Important Message Given:  Other (see comment)  Left a message for patient's legal guardian, Geraldo Pitter [phone]) to call me at her convenience. Will await a return call.    Juliann Pulse A Uriyah Massimo 02/27/2021, 3:13 PM

## 2021-02-28 LAB — CBC
HCT: 26.5 % — ABNORMAL LOW (ref 36.0–46.0)
Hemoglobin: 8.3 g/dL — ABNORMAL LOW (ref 12.0–15.0)
MCH: 29.1 pg (ref 26.0–34.0)
MCHC: 31.3 g/dL (ref 30.0–36.0)
MCV: 93 fL (ref 80.0–100.0)
Platelets: 346 10*3/uL (ref 150–400)
RBC: 2.85 MIL/uL — ABNORMAL LOW (ref 3.87–5.11)
RDW: 16 % — ABNORMAL HIGH (ref 11.5–15.5)
WBC: 5.1 10*3/uL (ref 4.0–10.5)
nRBC: 0 % (ref 0.0–0.2)

## 2021-02-28 LAB — BASIC METABOLIC PANEL
Anion gap: 6 (ref 5–15)
BUN: 12 mg/dL (ref 8–23)
CO2: 27 mmol/L (ref 22–32)
Calcium: 8.3 mg/dL — ABNORMAL LOW (ref 8.9–10.3)
Chloride: 99 mmol/L (ref 98–111)
Creatinine, Ser: 0.59 mg/dL (ref 0.44–1.00)
GFR, Estimated: >60 mL/min (ref >60)
Glucose, Bld: 162 mg/dL — ABNORMAL HIGH (ref 70–99)
Potassium: 3.8 mmol/L (ref 3.5–5.1)
Sodium: 132 mmol/L — ABNORMAL LOW (ref 135–145)

## 2021-02-28 LAB — GLUCOSE, CAPILLARY
Glucose-Capillary: 148 mg/dL — ABNORMAL HIGH (ref 70–99)
Glucose-Capillary: 212 mg/dL — ABNORMAL HIGH (ref 70–99)

## 2021-02-28 NOTE — TOC Progression Note (Signed)
Transition of Care [facility]) - Progression Note    Patient Details  Name: Tamara Mejia MRN: [id number] Date of Birth: [birth year]  Transition of Care [facility]) CM/SW [place], RN Phone Number: 02/28/2021, 12:25 PM  Clinical Narrative:   The patient is to DC to [facility] in [place] room [phone], the bedside nurse to call report o [phone] Notified Geraldo Pitter Transport picking up at 330   Expected Discharge Plan: [place] Barriers to Discharge: Continued Medical Work up  Expected Discharge Plan and Services Expected Discharge Plan: [place]         Expected Discharge Date: 02/28/21                                     Social Determinants of Health (SDOH) Interventions    Readmission Risk Interventions No flowsheet data found.

## 2021-02-28 NOTE — Plan of Care (Signed)

## 2021-02-28 NOTE — Plan of Care (Signed)
°  Problem: Clinical Measurements: Goal: Will remain free from infection Outcome: Not Progressing   Problem: Nutrition: Goal: Adequate nutrition will be maintained Outcome: Not Progressing   Problem: Safety: Goal: Ability to remain free from injury will improve Outcome: Not Progressing   

## 2021-02-28 NOTE — Care Management Important Message (Signed)
Important Message  Patient Details  Name: Tamara Mejia MRN: [id number] Date of Birth: 01-Jul-1950   Medicare Important Message Given:  Yes  I reviewed the Important Message from Medicare with the patient's Legal Guardian, Geraldo Pitter by phone [phone]) and she is in agreement with the discharge. I asked if she would like me to send her a copy and she declined as she already has a copy. I thanked her for her time.   Juliann Pulse A Satonya Lux 02/28/2021, 10:53 AM

## 2021-02-28 NOTE — Progress Notes (Signed)
Report called to Ronalee Belts at [facility] in [place]

## 2021-02-28 NOTE — Progress Notes (Signed)
Occupational Therapy Treatment Patient Details Name: Tamara Mejia MRN: [id number] DOB: 04/18/1950 Today's Date: 02/28/2021   History of present illness Pt is a 71 y/o F admitted on 02/14/21 with c/c of R hip pain. Pt is admitted for work-up. MRI findings consistent with muscular strain and  associated obturator internus bursitis. Blood cultures positive for MSSA. 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 Pt in recliner, required SBA for ADL transfers from recliner and from [facility] with 1 VC for hand placement with improved carryover. Pt transferred to the [facility] with VC for RW mgt/safety, SBA in standing for pericare. Pt noted a spot on her RUE that was bothering her. RN notified. Pt progressing, continues to benefit.    Recommendations for follow up therapy are one component of a multi-disciplinary discharge planning process, led by the attending physician.  Recommendations may be updated based on patient status, additional functional criteria and insurance authorization.    Follow Up Recommendations  Long-term institutional care without follow-up therapy    Assistance Recommended at Discharge Frequent or constant Supervision/Assistance  Patient can return home with the following  Direct supervision/assist for medications management;Assist for transportation;Assistance with cooking/housework;Help with stairs or ramp for entrance;A little help with walking and/or transfers;A lot of help with bathing/dressing/bathroom   Equipment Recommendations  BSC/3in1;Other (comment) (reacher, LH sponge)    Recommendations for Other Services      Precautions / Restrictions Precautions Precautions: Fall Restrictions Weight Bearing Restrictions: Yes RLE Weight Bearing: Weight bearing as tolerated       Mobility Bed  Mobility               General bed mobility comments: NT, up in recliner    Transfers Overall transfer level: Needs assistance Equipment used: Rolling walker (2 wheels) Transfers: Sit to/from Stand Sit to Stand: Min guard, Supervision           General transfer comment: VC hand placement/RW mgt     Balance Overall balance assessment: Needs assistance Sitting-balance support: No upper extremity supported, Feet supported Sitting balance-Leahy Scale: Good     Standing balance support: No upper extremity supported, During functional activity, Single extremity supported Standing balance-Leahy Scale: Fair                             ADL either performed or assessed with clinical judgement   ADL Overall ADL's : Needs assistance/impaired                         Toilet Transfer: BSC/3in1;Rolling walker (2 wheels);Min guard Toilet Transfer Details (indicate cue type and reason): VC safety w/ RW Toileting- Clothing Manipulation and Hygiene: Supervision/safety;Sit to/from stand;Set up              Extremity/Trunk Assessment              Vision       Perception     Praxis      Cognition Arousal/Alertness: Awake/alert Behavior During Therapy: WFL for tasks assessed/performed Overall Cognitive Status: History of cognitive impairments - at baseline  Exercises      Shoulder Instructions       General Comments      Pertinent Vitals/ Pain       Pain Assessment Pain Assessment: Faces Faces Pain Scale: Hurts a little bit Pain Location: R arm Pain Descriptors / Indicators: Aching Pain Intervention(s): Limited activity within patient's tolerance, Monitored during session, Patient requesting pain meds-RN notified  Home Living                                          Prior Functioning/Environment              Frequency  Min 3X/week         Progress Toward Goals  OT Goals(current goals can now be found in the care plan section)  Progress towards OT goals: Progressing toward goals  Acute Rehab OT Goals Patient Stated Goal: to go home OT Goal Formulation: With patient Time For Goal Achievement: 03/04/21 Potential to Achieve Goals: Good  Plan Frequency remains appropriate;Discharge plan remains appropriate    Co-evaluation                 AM-PAC OT "6 Clicks" Daily Activity     Outcome Measure   Help from another person eating meals?: None Help from another person taking care of personal grooming?: A Little Help from another person toileting, which includes using toliet, bedpan, or urinal?: A Little Help from another person bathing (including washing, rinsing, drying)?: A Lot Help from another person to put on and taking off regular upper body clothing?: A Little Help from another person to put on and taking off regular lower body clothing?: A Lot 6 Click Score: 17    End of Session Equipment Utilized During Treatment: Rolling walker (2 wheels)  OT Visit Diagnosis: Other abnormalities of gait and mobility (R26.89) Pain - Right/Left: Right Pain - part of body: Arm   Activity Tolerance Patient tolerated treatment well   Patient Left in chair;with call bell/phone within reach;with chair alarm set   Nurse Communication Patient requests pain meds        Time: 5208-0223 OT Time Calculation (min): 10 min  Charges: OT General Charges $OT Visit: 1 Visit OT Treatments $Self Care/Home Management : 8-22 mins  Ardeth Perfect., MPH, MS, OTR/L ascom [phone] 02/28/21, 1:10 PM

## 2021-03-01 LAB — CULTURE, BLOOD (ROUTINE X 2)
Culture: NO GROWTH
Culture: NO GROWTH
Special Requests: ADEQUATE
Special Requests: ADEQUATE

## 2021-03-03 LAB — CULTURE, BLOOD (ROUTINE X 2)
Culture: NO GROWTH
Special Requests: ADEQUATE

## 2021-03-11 ENCOUNTER — Other Ambulatory Visit: Payer: Self-pay

## 2021-03-11 ENCOUNTER — Ambulatory Visit: Payer: Medicare Other | Attending: Infectious Diseases | Admitting: Infectious Diseases

## 2021-03-11 ENCOUNTER — Telehealth: Payer: Self-pay

## 2021-03-11 DIAGNOSIS — B9561 Methicillin susceptible Staphylococcus aureus infection as the cause of diseases classified elsewhere: Secondary | ICD-10-CM | POA: Diagnosis not present

## 2021-03-11 DIAGNOSIS — M4620 Osteomyelitis of vertebra, site unspecified: Secondary | ICD-10-CM | POA: Diagnosis not present

## 2021-03-11 DIAGNOSIS — R7881 Bacteremia: Secondary | ICD-10-CM | POA: Diagnosis present

## 2021-03-11 NOTE — Telephone Encounter (Signed)
I spoke with Tamara Mejia with [facility]  [phone]) in [place] regarding patient's missed appointment today. Per Tamara Mejia patient's nurse Tamara Mejia would call our office back regarding the patient. I have provided Dr. Gwenevere Ghazi office number for Tamara Mejia to call back.  Tamara Mejia Tamara Mejia

## 2021-03-11 NOTE — Progress Notes (Signed)
The purpose of this virtual visit is to provide medical care while limiting exposure to the novel coronavirus (COVID19) for both patient and office staff.   Consent was obtained for phone visit:  Yes.   Answered questions that patient had about telehealth interaction:  Yes.   I discussed the limitations, risks, security and privacy concerns of performing an evaluation and management service by telephone. I also discussed with the patient that there may be a patient responsible charge related to this service. The patient expressed understanding and agreed to proceed.   Patient Location: [place] Provider Location: Office People on the call- patient, Nursing staff Pamala Hurry, Telemetry tech adeli PT a 71 yr female was in [facility] between  02/14/21-02/28/21 with ca breast on chemo thru port,mental disability admitted for MSSA bacteremia due to infected port which was removed on 02/18/21. She also had initally on admission pain rt buttock area Persistent bacteremia until 02/14/21 [facility]- MSSA 02/17/21 BC MSSA 02/18/21 Cath tip 2/2/BC 1 of 4 positive MSSA 02/24/21 BC- Neg TEE from 02/19/21 NEG MRI on 02/22/21-  12 mm rim enhancing collection, likely abscess, in the paraspinal soft tissues just inferior to the L1 transverse process, with surrounding inflammation tracking into the right neural foramen and along the right aspect of the thecal sac, with dural and epidural inflammation, causing moderate thecal sac narrowing and severe right neural foraminal narrowing at this level. No intraspinal abscess. 2. No evidence of discitis or osteomyelitis Some amount of muscle edema in the right obturator muscle at the insertion of the ischial tuberosity with enhancement on the postcontrast images. Patient was discussed with neurosurgeon and no role for surgery currently. No abscess big enough for anything kind of aspiration even by IR. Cefazolin was changed to nafcillin and she was discharged to [facility] on continuous  Nafcillin infusion for 6 weeks until 04/08/21 Pt is doing fine Says no fever or rash or itching or diarrhea Appetite good Labs reviewed cr 0.6, WBC 3.9 LFTS N Plan Continue IV nafcillin Spoke to Dr.Diamond at [facility] and asked him to closely follow wbc- If there is a persistent downward trend will have to change nafcillin to cefazolin Remove PICC after completion of antibiotic Let him know that he can reach [facility] physicians who are covering me while I am away in March Total time spent 21 min

## 2021-03-12 ENCOUNTER — Telehealth: Payer: Self-pay | Admitting: *Deleted

## 2021-03-12 NOTE — Telephone Encounter (Signed)
please cancel appt on 3/8 and schedule MD only on 3/22 @ 2:45p. POA and Facilty informed of appt change,

## 2021-03-12 NOTE — Telephone Encounter (Signed)
Return call made to [facility]. She wanted to update and let us know that patient has a staff blood infection from port, so she now has a PICC line and is getting IV antibiotics. She was transferred [facility] [place] and they don't think they will be discharged by March 16. She doesn't think that patient may be able to get IV chemo. Informed her that future plan will be discussed with MD at visit. Visit will need to be rescheduled to 3/22 @ 2:45, per Jeanne's request.   Called home care and spoke to Prentiss and updated her on appts. She verbalized understanding. Per Kennyth Lose, she states that she was told that pt will be discharged on 3/21. Informed her to call back and r/s if pt is still at hospital on 3/22.

## 2021-03-12 NOTE — Telephone Encounter (Signed)
Patient guardian Tyrone Apple called asking to speak with Benjamine Mola directly.  She states you are aware that patient is in hospital, but wants to update you on her and asks that you call her back in the afternoon hours as she has radiation therapy herself in the mornings [phone]

## 2021-03-26 ENCOUNTER — Ambulatory Visit: Payer: Medicare Other | Admitting: Oncology

## 2021-04-09 ENCOUNTER — Encounter: Payer: Self-pay | Admitting: Oncology

## 2021-04-09 ENCOUNTER — Inpatient Hospital Stay: Payer: Medicare Other | Attending: Oncology | Admitting: Oncology

## 2021-04-09 ENCOUNTER — Other Ambulatory Visit: Payer: Self-pay

## 2021-04-09 VITALS — BP 131/80 | HR 100 | Temp 96.6°F | Resp 16 | Wt 179.4 lb

## 2021-04-09 DIAGNOSIS — I10 Essential (primary) hypertension: Secondary | ICD-10-CM | POA: Insufficient documentation

## 2021-04-09 DIAGNOSIS — Z170 Estrogen receptor positive status [ER+]: Secondary | ICD-10-CM | POA: Diagnosis not present

## 2021-04-09 DIAGNOSIS — Z87898 Personal history of other specified conditions: Secondary | ICD-10-CM | POA: Diagnosis not present

## 2021-04-09 DIAGNOSIS — C50912 Malignant neoplasm of unspecified site of left female breast: Secondary | ICD-10-CM

## 2021-04-09 DIAGNOSIS — E119 Type 2 diabetes mellitus without complications: Secondary | ICD-10-CM | POA: Diagnosis not present

## 2021-04-09 DIAGNOSIS — Z90710 Acquired absence of both cervix and uterus: Secondary | ICD-10-CM | POA: Diagnosis not present

## 2021-04-09 DIAGNOSIS — Z803 Family history of malignant neoplasm of breast: Secondary | ICD-10-CM | POA: Insufficient documentation

## 2021-04-09 DIAGNOSIS — Z7189 Other specified counseling: Secondary | ICD-10-CM | POA: Diagnosis not present

## 2021-04-09 NOTE — Progress Notes (Signed)
Pt in for follow up, recently discharged from hospital.  Group home caregiver present today. MD to call legal guardian during visit.  ?

## 2021-04-09 NOTE — Progress Notes (Signed)
?Hematology/Oncology Progress note ?[phone] [phone] ?  ? ? ? ?Patient Care Team: ?Kirk Ruths, MD as PCP - General (Internal Medicine) ?Theodore Demark, RN as Oncology Nurse Navigator ? ?REFERRING PROVIDER: ?Kirk Ruths, MD  ?CHIEF COMPLAINTS/REASON FOR VISIT:  ?left breast cancer ? ?HISTORY OF PRESENTING ILLNESS:  ? ?Tamara Mejia is a  71 y.o.  female with PMH listed below was seen in consultation at the request of  Kirk Ruths, MD  for evaluation of left breast cancer.  ?She has screening mammogram done which recommends additional left breast diagnostic mammogram.  ?09/26/2020 unilateral left diagnostic mammogram showed there is a suspicious 17 mm mass in the LEFT upper breast which is concerning for malignancy. Recommend ultrasound-guided biopsy for definitive characterization. No suspicious LEFT axillary adenopathy. ? ?10/08/2020 left breast US guided biopsy showed invasive mammary carcinoma, no special type. Grade 3, no DCIS, no LVI.  ?ER 51-90%, PR 51-90%, HER2 negative.  ? ?Family history of breast cancer: maternal cousin breast cancer. Great aunt breast cancer.  ?Family history of other cancers: no ? ?Menarche: unknown ?Menopause: hysterotomy during her teenager ?Number of pregnancies : none ? ?Used OCP: never ?Used estrogen and progesterone therapy: never ? 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  lives in a group home. ?She has a guardian, Geraldo Pitter who is her maternal cousin.  ? ?11/22/2020 stage IB left breast cancer status postlumpectomy and sentinel lymph node biopsy, ER/PR+, HER2 - pT1c pN1a M0 ?Mammaprint came back high risk, 29% recurrence risk in 10 years ? ?#01/09/2019 start adjuvant TC ? ?INTERVAL HISTORY ?Tamara Mejia is a 71 y.o. female who has above history reviewed by me today presents for follow up visit for left breast cancer.  ? ?02/14/21- 02/28/2021  admission due to MSSA bacteremia due to infected medi port.  Medi port was removed on 02/18/2021.  ?Small abscess seen on MRI of the lumbar spine on 02/22/2021.  Too small to drain  ?TEE from 02/19/21 NEG ?She was seen by ID and treated with IV antibiotics. Picc line was placed and patient finished total of 6 weeks of IV antibiotics. PICC line removal.  ?She has follow up appointment with ID Dr.Ravishankar tomorrow.  ? ?Today she was accompanied by staff from group home.  ?She feels well today. No fever or chills.  ? ?Review of Systems  ?Constitutional:  Negative for appetite change, chills, fatigue and fever.  ?HENT:   Negative for hearing loss and voice change.   ?Eyes:  Negative for eye problems.  ?Respiratory:  Negative for chest tightness and cough.   ?Cardiovascular:  Negative for chest pain.  ?Gastrointestinal:  Negative for abdominal distention, abdominal pain, blood in stool and nausea.  ?Endocrine: Negative for hot flashes.  ?Genitourinary:  Negative for difficulty urinating and frequency.   ?Musculoskeletal:  Negative for arthralgias.  ?Skin:  Negative for itching and rash.  ?Neurological:  Negative for extremity weakness.  ?Hematological:  Negative for adenopathy.  ?Psychiatric/Behavioral:  Negative for confusion.   ? ?MEDICAL HISTORY:  ?Past Medical History:  ?Diagnosis Date  ? Breast cancer ([place])   ? Cognitive dysfunction   ? 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 Diabetes mellitus, type 2 ([place])   ? diet controlled  ? Family history of breast cancer   ? High cholesterol   ? History of 2019 novel coronavirus disease (COVID-19) 11/04/2020  ? a.) resides in group home; others were  sick with SARS-Cov-2. Home test (+).  ? Hypertension   ? Meningitis   ? in childhood  ? Obesity (BMI 30-39.9)   ? Overactive bladder   ? Seizure ([place])   ? last seizure 2000  ? ? ?SURGICAL HISTORY: ?Past Surgical History:  ?Procedure Laterality Date  ? ABDOMINAL HYSTERECTOMY    ? as a teenager  ? BREAST BIOPSY Right   ? neg  ?  BREAST BIOPSY Left 10/18/2020  ? u/s breast  biopsy, "venus" clip-INVASIVE MAMMARY CARCINOMA  ? CHOLECYSTECTOMY    ? COLONOSCOPY WITH PROPOFOL N/A 08/27/2015  ? Procedure: COLONOSCOPY WITH PROPOFOL;  Surgeon: Lucilla Lame, MD;  Location: [facility];  Service: Endoscopy;  Laterality: N/A;  ? PART MASTECTOMY,RADIO FREQUENCY LOCALIZER,AXILLARY SENTINEL NODE BIOPSY Left 11/22/2020  ? Procedure: PART MASTECTOMY, RADIO FREQUENCY LOCALIZER,AXILLARY SENTINEL NODE BIOPSY;  Surgeon: Herbert Pun, MD;  Location: [facility];  Service: General;  Laterality: Left;  ? PORTA CATH REMOVAL N/A 02/18/2021  ? Procedure: PORTA CATH REMOVAL;  Surgeon: Katha Cabal, MD;  Location: [place] [facility];  Service: Cardiovascular;  Laterality: N/A;  ? PORTACATH PLACEMENT N/A 12/31/2020  ? Procedure: INSERTION PORT-A-CATH;  Surgeon: Herbert Pun, MD;  Location: [facility];  Service: General;  Laterality: N/A;  ? TEE WITHOUT CARDIOVERSION N/A 02/19/2021  ? Procedure: TRANSESOPHAGEAL ECHOCARDIOGRAM (TEE);  Surgeon: Corey Skains, MD;  Location: [facility];  Service: Cardiovascular;  Laterality: N/A;  ? ? ?SOCIAL HISTORY: ?Social History  ? ?Socioeconomic History  ? Marital status: Single  ?  Spouse name: Not on file  ? Number of children: Not on file  ? Years of education: Not on file  ? Highest education level: Not on file  ?Occupational History  ? Not on file  ?Tobacco Use  ? Smoking status: Never  ? Smokeless tobacco: Never  ?Substance and Sexual Activity  ? Alcohol use: No  ? Drug use: No  ? Sexual activity: Not on file  ?Other Topics Concern  ? Not on file  ?Social History Narrative  ? Patient works for [employer]  ? Patient has her adult education certificate  ? Patient drinks about 2 cups of coffee daily.   ?   ?   ? ?Social Determinants of Health  ? ?Financial Resource Strain: Not on file  ?Food Insecurity: Not on file  ?Transportation Needs: Not on file  ?Physical Activity: Not on file  ?Stress: Not on file   ?Social Connections: Not on file  ?Intimate Partner Violence: Not on file  ? ? ?FAMILY HISTORY: ?Family History  ?Problem Relation Age of Onset  ? Diabetes Father   ? Heart disease Father   ? Heart disease Brother   ? Breast cancer Cousin   ?     dx 54s  ? ? ?ALLERGIES:  has No Known Allergies. ? ?MEDICATIONS:  ?Current Outpatient Medications  ?Medication Sig Dispense Refill  ? acetaminophen (TYLENOL) 500 MG tablet Take 1,000 mg by mouth every 8 (eight) hours as needed (pain.).    ? amLODipine (NORVASC) 5 MG tablet Take 5 mg by mouth in the morning.    ? aspirin 81 MG EC tablet Take 81 mg by mouth in the morning. Swallow whole.    ? atorvastatin (LIPITOR) 10 MG tablet Take 10 mg by mouth daily.    ? carbamazepine (TEGRETOL) 200 MG tablet TAKE ONE TABLET BY MOUTH THREE TIMES DAILY. (SEIZURE CONTROL) 90 tablet 11  ? Cholecalciferol (VITAMIN D) 50 MCG (2000 UT) tablet Take 4,000 Units  by mouth in the morning.    ? citalopram (CELEXA) 10 MG tablet Take 1 tablet (10 mg total) by mouth daily. 30 tablet 0  ? hydrochlorothiazide (HYDRODIURIL) 25 MG tablet Take 25 mg by mouth daily.    ? lisinopril (ZESTRIL) 20 MG tablet Take 20 mg by mouth daily.    ? metFORMIN (GLUCOPHAGE) 500 MG tablet Take by mouth 2 (two) times daily with a meal.    ? metoprolol succinate (TOPROL-XL) 50 MG 24 hr tablet Take 1 tablet (50 mg total) by mouth at bedtime. Take with or immediately following a meal. 30 tablet 0  ? ondansetron (ZOFRAN) 8 MG tablet Take 1 tablet (8 mg total) by mouth 2 (two) times daily as needed for refractory nausea / vomiting. Start on day 3 after chemo. 30 tablet 1  ? ondansetron (ZOFRAN-ODT) 4 MG disintegrating tablet Take by mouth.    ? dexamethasone (DECADRON) 4 MG tablet Take 2 tablets (8 mg total) by mouth 2 (two) times daily. Start the day before Taxotere. Then again the day after chemo for 3 days. (Patient not taking: Reported on 04/09/2021) 30 tablet 1  ? loratadine (CLARITIN) 10 MG tablet Take 1 tablet (10 mg total)  by mouth daily. Take Claritin daily for 4 days, Day1-4 of each chemo cycle. (Patient not taking: Reported on 04/09/2021) 16 tablet 0  ? prochlorperazine (COMPAZINE) 10 MG tablet Take 1 tablet (10 mg total) by mouth

## 2021-04-10 ENCOUNTER — Encounter: Payer: Self-pay | Admitting: Infectious Diseases

## 2021-04-10 ENCOUNTER — Other Ambulatory Visit
Admission: RE | Admit: 2021-04-10 | Discharge: 2021-04-10 | Disposition: A | Discharge status: Home / Self Care | Payer: Medicare Other | Attending: Infectious Diseases | Admitting: Infectious Diseases

## 2021-04-10 ENCOUNTER — Ambulatory Visit: Payer: Medicare Other | Attending: Infectious Diseases | Admitting: Infectious Diseases

## 2021-04-10 VITALS — BP 118/76 | HR 96 | Temp 97.7°F | Wt 182.0 lb

## 2021-04-10 DIAGNOSIS — E119 Type 2 diabetes mellitus without complications: Secondary | ICD-10-CM | POA: Insufficient documentation

## 2021-04-10 DIAGNOSIS — Z853 Personal history of malignant neoplasm of breast: Secondary | ICD-10-CM | POA: Diagnosis not present

## 2021-04-10 DIAGNOSIS — Z8661 Personal history of infections of the central nervous system: Secondary | ICD-10-CM | POA: Insufficient documentation

## 2021-04-10 DIAGNOSIS — G061 Intraspinal abscess and granuloma: Secondary | ICD-10-CM | POA: Diagnosis not present

## 2021-04-10 DIAGNOSIS — B9561 Methicillin susceptible Staphylococcus aureus infection as the cause of diseases classified elsewhere: Secondary | ICD-10-CM | POA: Insufficient documentation

## 2021-04-10 DIAGNOSIS — Z79899 Other long term (current) drug therapy: Secondary | ICD-10-CM | POA: Diagnosis not present

## 2021-04-10 DIAGNOSIS — R7881 Bacteremia: Secondary | ICD-10-CM | POA: Diagnosis not present

## 2021-04-10 DIAGNOSIS — Z9221 Personal history of antineoplastic chemotherapy: Secondary | ICD-10-CM | POA: Insufficient documentation

## 2021-04-10 LAB — CBC WITH DIFFERENTIAL/PLATELET
Abs Immature Granulocytes: 0.01 10*3/uL (ref 0.00–0.07)
Basophils Absolute: 0.1 10*3/uL (ref 0.0–0.1)
Basophils Relative: 2 %
Eosinophils Absolute: 0.3 10*3/uL (ref 0.0–0.5)
Eosinophils Relative: 5 %
HCT: 37.9 % (ref 36.0–46.0)
Hemoglobin: 12.1 g/dL (ref 12.0–15.0)
Immature Granulocytes: 0 %
Lymphocytes Relative: 24 %
Lymphs Abs: 1.4 10*3/uL (ref 0.7–4.0)
MCH: 29.1 pg (ref 26.0–34.0)
MCHC: 31.9 g/dL (ref 30.0–36.0)
MCV: 91.1 fL (ref 80.0–100.0)
Monocytes Absolute: 0.4 10*3/uL (ref 0.1–1.0)
Monocytes Relative: 7 %
Neutro Abs: 3.8 10*3/uL (ref 1.7–7.7)
Neutrophils Relative %: 62 %
Platelets: 296 10*3/uL (ref 150–400)
RBC: 4.16 MIL/uL (ref 3.87–5.11)
RDW: 14 % (ref 11.5–15.5)
WBC: 6 10*3/uL (ref 4.0–10.5)
nRBC: 0 % (ref 0.0–0.2)

## 2021-04-10 LAB — C-REACTIVE PROTEIN: CRP: 0.9 mg/dL (ref <1.0)

## 2021-04-10 LAB — SEDIMENTATION RATE: Sed Rate: 38 mm/hr — ABNORMAL HIGH (ref 0–30)

## 2021-04-10 MED ORDER — DOXYCYCLINE HYCLATE 100 MG PO TABS: 100.0000 mg | ORAL_TABLET | Freq: Two times a day (BID) | ORAL | 0 refills | Status: DC

## 2021-04-10 NOTE — Patient Instructions (Signed)
You are here for follow up of staph aureus bacteremia and a small paraspinal abscess for which you received 6 weeks of Iv antibiotic- You will get labs today and will go on Doxycycline for 2-4 weeks depending on the lab results. ?Will follow PRN ?

## 2021-04-10 NOTE — Progress Notes (Signed)
NAME: Tamara Mejia  DOB: June 22, 1950  MRN: [id number]  Date/Time: 04/10/2021 9:25 AM   Subjective:   Patient here for follow-up after recent hospitalization.  She is here with her group home attendant Tasha. MARLEA VERZOSA is a 71 y.o. 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  On admission WBC was 16.5.  Blood culture was sent.  CT scan showed minimal fluid signal within the insertion of the right obturator internus muscle along the medial margin of the right ischial tuberosity.  Blood cultures came back positive for Staph aureus.  She was put on cefazolin.  Repeat blood cultures done on 02/17/2021, 02/20/2021 was positive for MSSA.  The port was removed on 02/18/2021 and the catheter tip culture was positive as well.  TEE done on 02/19/2021 was negative for endocarditis. The cefazolin was changed to nafcillin after 2-23.  She also had an MRI on 02/22/2021 which showed a 12 mm rim-enhancing collection likely abscess in the paraspinal soft tissue just inferior to the L1 transverse process with surrounding inflammation tracking into the right neural foramina and along the right aspect of the thecal sac, with dural and epidural inflammation, causing moderate thecal sac narrowing and severe right neural foraminal narrowing at this level.  There was no intraspinal abscess.  There was no evidence of discitis or osteomyelitis.  Repeat blood culture from 02/24/2021 was negative.  [facility] for total of 6 weeks until 04/08/2021. She completed antibiotics on 04/04/2021 and was discharged to the group home on 04/05/2021. Today she is doing well. No pain in her back Able to ambulate No fever or chills No diarrhea She saw oncologist yesterday and no plans to continue chemotherapy.  She  will be going for radiation therapy. 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 Home test (+).   Hypertension    Meningitis    in childhood   Obesity (BMI 30-39.9)    Overactive bladder    Seizure ([facility])    last seizure 2000    Past Surgical History:  Procedure Laterality Date   ABDOMINAL HYSTERECTOMY     as a teenager   BREAST BIOPSY Right    neg   BREAST BIOPSY Left 10/18/2020   u/s breast  biopsy, "venus" clip-INVASIVE MAMMARY CARCINOMA   CHOLECYSTECTOMY     COLONOSCOPY WITH PROPOFOL N/A 08/27/2015   Procedure: COLONOSCOPY WITH PROPOFOL;  Surgeon: Midge Minium, MD;  Location: [facility];  Service: Endoscopy;  Laterality: N/A;   PART MASTECTOMY,RADIO FREQUENCY LOCALIZER,AXILLARY SENTINEL NODE BIOPSY Left 11/22/2020   Procedure: PART MASTECTOMY, RADIO FREQUENCY LOCALIZER,AXILLARY SENTINEL NODE BIOPSY;  Surgeon: Carolan Shiver, MD;  Location: [facility];  Service: General;  Laterality: Left;   PORTA CATH REMOVAL N/A 02/18/2021   Procedure: PORTA CATH REMOVAL;  Surgeon: Renford Dills, MD;  Location: [facility];  Service: Cardiovascular;  Laterality: N/A;   PORTACATH PLACEMENT N/A 12/31/2020   Procedure: INSERTION PORT-A-CATH;  Surgeon: Carolan Shiver, MD;  Location: [facility];  Service: General;  Laterality: N/A;   TEE WITHOUT CARDIOVERSION N/A 02/19/2021   Procedure: TRANSESOPHAGEAL ECHOCARDIOGRAM (TEE);  Surgeon: Lamar Blinks, MD;  Location: [facility];  Service: Cardiovascular;  Laterality: N/A;    Social History   Socioeconomic History   Marital status: Single    Spouse name: Not on file   Number of children: Not on file    Years of education: Not on file   Highest education level: Not on file  Occupational History   Not on file  Tobacco Use   Smoking status: Never   Smokeless tobacco: Never  Substance and Sexual Activity   Alcohol use: No   Drug use: No   Sexual activity: Not on file  Other Topics Concern   Not on file  Social History Narrative   Patient works for cutting [employer]   Patient has her adult education certificate   Patient drinks about 2 cups of coffee daily.          Social Determinants of Health   Financial Resource Strain: Not on file  Food Insecurity: Not on file  Transportation Needs: Not on file  Physical Activity: Not on file  Stress: Not on file  Social Connections: Not on file  Intimate Partner Violence: Not on file    Family History  Problem Relation Age of Onset   Diabetes Father    Heart disease Father    Heart disease Brother    Breast cancer Cousin        dx 42s   No Known Allergies I? Current Outpatient Medications  Medication Sig Dispense Refill   acetaminophen (TYLENOL) 500 MG tablet Take 1,000 mg by mouth every 8 (eight) hours as needed (pain.).     amLODipine (NORVASC) 5 MG tablet Take 5 mg by mouth in the morning.     aspirin 81 MG EC tablet Take 81 mg by mouth in the morning. Swallow whole.     atorvastatin (LIPITOR) 10 MG tablet Take 10 mg by mouth daily.     carbamazepine (TEGRETOL) 200 MG tablet TAKE ONE TABLET BY MOUTH THREE TIMES DAILY. (SEIZURE CONTROL) 90 tablet 11   Cholecalciferol (VITAMIN D) 50 MCG (2000 UT) tablet Take 4,000 Units by mouth in the morning.     dexamethasone (DECADRON) 4 MG tablet Take 2 tablets (8 mg total) by mouth 2 (two) times daily. Start the day before Taxotere. Then again the day after chemo for 3 days. 30 tablet 1   hydrochlorothiazide (HYDRODIURIL) 25 MG tablet Take 25 mg by mouth daily.     lisinopril (ZESTRIL) 20 MG tablet Take 20 mg by mouth daily.     loratadine (CLARITIN) 10 MG tablet Take 1 tablet (10 mg total) by  mouth daily. Take Claritin daily for 4 days, Day1-4 of each chemo cycle. 16 tablet 0   metFORMIN (GLUCOPHAGE) 500 MG tablet Take by mouth 2 (two) times daily with a meal.     ondansetron (ZOFRAN) 8 MG tablet Take 1 tablet (8 mg total) by mouth 2 (two) times daily as needed for refractory nausea / vomiting. Start on day 3 after chemo. 30 tablet 1   ondansetron (ZOFRAN-ODT) 4 MG disintegrating tablet Take by mouth.     prochlorperazine (COMPAZINE) 10 MG tablet Take 1 tablet (10 mg total) by mouth every 6 (six) hours as needed (Nausea or vomiting). 30 tablet 1   citalopram (CELEXA) 10 MG tablet Take 1 tablet (10 mg total)  by mouth daily. 30 tablet 0   metoprolol succinate (TOPROL-XL) 50 MG 24 hr tablet Take 1 tablet (50 mg total) by mouth at bedtime. Take with or immediately following a meal. 30 tablet 0   No current facility-administered medications for this visit.     Abtx:  Anti-infectives (From admission, onward)    None       REVIEW OF SYSTEMS:  Const: negative fever, negative chills, negative weight loss Eyes: negative diplopia or visual changes, negative eye pain ENT: negative coryza, negative sore throat Resp: negative cough, hemoptysis, dyspnea Cards: negative for chest pain, palpitations, lower extremity edema GU: negative for frequency, dysuria and hematuria GI: Negative for abdominal pain, diarrhea, bleeding, constipation Skin: negative for rash and pruritus Heme: negative for easy bruising and gum/nose bleeding MS: negative for myalgias, arthralgias, back pain and muscle weakness Neurolo:negative for headaches, dizziness, vertigo, memory problems  Psych: negative for feelings of anxiety, depression  Endocrine:, diabetes Allergy/Immunology- negative for any medication or food allergies ? Pertinent Positives include : Objective:  VITALS:  BP 118/76   Pulse 96   Temp 97.7 F (36.5 C) (Oral)   Wt 182 lb (82.6 kg)   BMI 34.39 kg/m  PHYSICAL EXAM:  General: Alert,  cooperative, no distress,  Head: Normocephalic, without obvious abnormality, atraumatic. Eyes: Conjunctivae clear, anicteric sclerae. Pupils are equal ENT Nares normal. No drainage or sinus tenderness. Lips, mucosa, and tongue normal. No Thrush Neck: Supple, symmetrical, no adenopathy, thyroid: non tender no carotid bruit and no JVD. Back: No CVA tenderness. Lungs: Clear to auscultation bilaterally. No Wheezing or Rhonchi. No rales. Heart: Regular rate and rhythm, no murmur, rub or gallop. Abdomen: Soft, non-tender,not distended. Bowel sounds normal. No masses Extremities: atraumatic, no cyanosis. No edema. No clubbing Skin: No rashes or lesions. Or bruising Lymph: Cervical, supraclavicular normal. Neurologic: Grossly non-focal Pertinent Labs None available. ? Impression/Recommendation ?MSSA bacteremia with 12 mm paraspinal abscess- L1-area treated with 6 weeks of Iv antibiotic Nafcillin Also had PORT infection and it was removed She completed IV nafcillin on 04/05/21 She was discharged from [facility] on 04/06/21 Will do labs today-ESR, CRP, CBC and blood culture Will give Doxy 100 mg p.o. twice daily for 2-4 weeks. We will inform the group home once I have the results.  [phone] Tasha Discussed the management with the patient and the group home attendant in great detail. ? ___________________________________________________ Discussed with patient, requesting provider Note:  This document was prepared using Dragon voice recognition software and may include unintentional dictation errors.

## 2021-04-14 ENCOUNTER — Other Ambulatory Visit: Payer: Self-pay

## 2021-04-14 ENCOUNTER — Ambulatory Visit
Admission: RE | Admit: 2021-04-14 | Discharge: 2021-04-14 | Disposition: A | Discharge status: Home / Self Care | Payer: Medicare Other | Source: Ambulatory Visit | Attending: Radiation Oncology | Admitting: Radiation Oncology

## 2021-04-14 ENCOUNTER — Encounter: Payer: Self-pay | Admitting: Radiation Oncology

## 2021-04-14 VITALS — BP 129/72 | HR 100 | Temp 97.2°F | Resp 16 | Ht 63.0 in | Wt 176.7 lb

## 2021-04-14 DIAGNOSIS — E119 Type 2 diabetes mellitus without complications: Secondary | ICD-10-CM | POA: Diagnosis not present

## 2021-04-14 DIAGNOSIS — I10 Essential (primary) hypertension: Secondary | ICD-10-CM | POA: Insufficient documentation

## 2021-04-14 DIAGNOSIS — C50912 Malignant neoplasm of unspecified site of left female breast: Secondary | ICD-10-CM | POA: Diagnosis not present

## 2021-04-14 DIAGNOSIS — E785 Hyperlipidemia, unspecified: Secondary | ICD-10-CM | POA: Diagnosis not present

## 2021-04-14 DIAGNOSIS — Z803 Family history of malignant neoplasm of breast: Secondary | ICD-10-CM | POA: Diagnosis not present

## 2021-04-14 DIAGNOSIS — Z7984 Long term (current) use of oral hypoglycemic drugs: Secondary | ICD-10-CM | POA: Diagnosis not present

## 2021-04-14 DIAGNOSIS — F79 Unspecified intellectual disabilities: Secondary | ICD-10-CM | POA: Insufficient documentation

## 2021-04-14 DIAGNOSIS — Z79899 Other long term (current) drug therapy: Secondary | ICD-10-CM | POA: Diagnosis not present

## 2021-04-14 DIAGNOSIS — Z170 Estrogen receptor positive status [ER+]: Secondary | ICD-10-CM | POA: Insufficient documentation

## 2021-04-14 NOTE — Consult Note (Signed)
?NEW PATIENT EVALUATION ? ?Name: Tamara Mejia  ?MRN: [id number]  ?Date:   04/14/2021     ?DOB: [birth year] ? ? ?This 71 y.o. 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  Mental retardation ? ?REFERRING PHYSICIAN: Kirk Ruths, MD ? ?CHIEF COMPLAINT:  ?Chief Complaint  ?Patient presents with  ? Breast Cancer  ?  Initial consultation  ? ? ?DIAGNOSIS: The encounter diagnosis was Malignant neoplasm of left breast in female, estrogen receptor positive, unspecified site of breast ([place]). ?  ?PREVIOUS INVESTIGATIONS:  ?Mammogram and ultrasound reviewed ?Clinical notes reviewed ?Pathology reports reviewed ? 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  She presented with an abnormal mammogram of the left breast showing a suspicious 17 mm mass in the left upper breast concerning for malignancy.  No suspicious left axillary adenopathy on ultrasound was noted.  She underwent ultrasound-guided biopsy which was positive for invasive mammary carcinoma.  She went on to have a wide local excision and sentinel node biopsy.  Tumor was 1.9 cm overall grade 3 invasive mammary carcinoma no special type.  Margins were clear by at least 5 mm.  1 of 2 sentinel lymph nodes had a macro metastatic foci of metastatic disease.  There are only 2 lymph nodes sampled.  She went on to have MammaPrint which showed a 29% risk of recurrence.  She underwent 2 cycles of TC.  She was admitted back in late January early February with MSSA bacteremia due to infected medi port.  She had no further chemotherapy.  She is now referred to radiation oncology for consideration of treatment.  She is doing well specifically [place] breast tenderness  cough or bone pain at this point. ? ?PLANNED TREATMENT REGIMEN: Left whole breast and peripheral lymphatic radiation ? ?PAST MEDICAL HISTORY:  has a past medical history of Breast cancer ([place]), Cognitive dysfunction, Diabetes mellitus, type 2 ([place]), Family history of breast cancer, High cholesterol, History of 2019 novel coronavirus disease (COVID-19) (11/04/2020), Hypertension, Meningitis, Obesity (BMI 30-39.9), Overactive bladder, and Seizure ([place]).   ? ?PAST SURGICAL HISTORY:  ?Past Surgical History:  ?Procedure Laterality Date  ? ABDOMINAL HYSTERECTOMY    ? as a teenager  ? BREAST BIOPSY Right   ? neg  ? BREAST BIOPSY Left 10/18/2020  ? u/s breast  biopsy, "venus" clip-INVASIVE MAMMARY CARCINOMA  ? CHOLECYSTECTOMY    ? COLONOSCOPY WITH PROPOFOL N/A 08/27/2015  ? Procedure: COLONOSCOPY WITH PROPOFOL;  Surgeon: Lucilla Lame, MD;  Location: [facility];  Service: Endoscopy;  Laterality: N/A;  ? PART MASTECTOMY,RADIO FREQUENCY LOCALIZER,AXILLARY SENTINEL NODE BIOPSY Left 11/22/2020  ? Procedure: PART MASTECTOMY, RADIO FREQUENCY LOCALIZER,AXILLARY SENTINEL NODE BIOPSY;  Surgeon: Herbert Pun, MD;  Location: [facility];  Service: General;  Laterality: Left;  ? PORTA CATH REMOVAL N/A 02/18/2021  ? Procedure: PORTA CATH REMOVAL;  Surgeon: Katha Cabal, MD;  Location: [place] [facility];  Service: Cardiovascular;  Laterality: N/A;  ? PORTACATH PLACEMENT N/A 12/31/2020  ? Procedure: INSERTION PORT-A-CATH;  Surgeon: Herbert Pun, MD;  Location: [facility];  Service: General;  Laterality: N/A;  ? TEE WITHOUT CARDIOVERSION N/A 02/19/2021  ? Procedure: TRANSESOPHAGEAL ECHOCARDIOGRAM (TEE);  Surgeon: Corey Skains, MD;  Location: [facility];  Service: Cardiovascular;  Laterality: N/A;  ? ? ?FAMILY HISTORY: family history includes Breast cancer in her cousin; Diabetes in her father; Heart disease in her brother and father. ? ?SOCIAL HISTORY:  reports that she has never smoked. She has never used smokeless  tobacco. She reports that she does not drink alcohol and does not use drugs. ? ?ALLERGIES: Patient has no known allergies. ? ?MEDICATIONS:  ?Current Outpatient Medications  ?Medication Sig Dispense Refill  ? acetaminophen (TYLENOL) 500 MG tablet Take 1,000 mg by mouth every 8 (eight) hours as needed (pain.).    ? amLODipine (NORVASC) 5 MG tablet Take 5 mg by mouth in the morning.    ? aspirin 81 MG EC tablet Take 81 mg by mouth in the morning. Swallow whole.    ? atorvastatin (LIPITOR) 10 MG tablet Take 10 mg by mouth daily.    ? carbamazepine (TEGRETOL) 200 MG tablet TAKE ONE TABLET BY MOUTH THREE TIMES DAILY. (SEIZURE CONTROL) 90 tablet 11  ? Cholecalciferol (VITAMIN D) 50 MCG (2000 UT) tablet Take 4,000 Units by mouth in the morning.    ? doxycycline (VIBRA-TABS) 100 MG tablet Take 1 tablet (100 mg total) by mouth 2 (two) times daily. 60 tablet 0  ? hydrochlorothiazide (HYDRODIURIL) 25 MG tablet Take 25 mg by mouth daily.    ? lisinopril (ZESTRIL) 20 MG tablet Take 20 mg by mouth daily.    ? metFORMIN (GLUCOPHAGE) 500 MG tablet Take by mouth 2 (two) times daily with a meal.    ? citalopram (CELEXA) 10 MG tablet Take 1 tablet (10 mg total) by mouth daily. 30 tablet 0  ? dexamethasone (DECADRON) 4 MG tablet Take 2 tablets (8 mg total) by mouth 2 (two) times daily. Start the day before Taxotere. Then again the day after chemo for 3 days. (Patient not taking: Reported on 04/14/2021) 30 tablet 1  ? loratadine (CLARITIN) 10 MG tablet Take 1 tablet (10 mg total) by mouth daily. Take Claritin daily for 4 days, Day1-4 of each chemo cycle. (Patient not taking: Reported on 04/14/2021) 16 tablet 0  ? metoprolol succinate (TOPROL-XL) 50 MG 24 hr tablet Take 1 tablet (50 mg total) by mouth at bedtime. Take with or immediately following a meal. 30 tablet 0  ? ondansetron (ZOFRAN) 8 MG tablet Take 1 tablet (8 mg total) by mouth 2 (two) times daily as needed for refractory nausea / vomiting. Start on day 3 after chemo. (Patient not  taking: Reported on 04/14/2021) 30 tablet 1  ? ondansetron (ZOFRAN-ODT) 4 MG disintegrating tablet Take by mouth. (Patient not taking: Reported on 04/14/2021)    ? prochlorperazine (COMPAZINE) 10 MG tablet Take 1 tablet (10 mg total) by mouth every 6 (six) hours as needed (Nausea or vomiting). (Patient not taking: Reported on 04/14/2021) 30 tablet 1  ? ?No current facility-administered medications for this encounter.  ? ? ?ECOG PERFORMANCE STATUS:  0 - Asymptomatic ? 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 ?Patient denies any weight loss, fatigue, weakness, fever, chills or night sweats. Patient denies any loss of vision, blurred vision. Patient denies any ringing  of the ears or hearing loss. No irregular heartbeat. Patient denies heart murmur or history of fainting. Patient denies any chest pain or pain radiating to her upper extremities. Patient denies any shortness of breath, difficulty breathing at night, cough or hemoptysis. Patient denies any swelling in the lower legs. Patient denies any nausea vomiting, vomiting of blood, or coffee ground  material in the vomitus. Patient denies any stomach pain. Patient states has had normal bowel movements no significant constipation or diarrhea. Patient denies any dysuria, hematuria or significant nocturia. Patient denies any problems walking, swelling in the joints or loss of balance. Patient denies any skin changes, loss of hair or loss of weight. Patient denies any excessive worrying or anxiety or significant depression. Patient denies any problems with insomnia. Patient denies excessive thirst, polyuria, polydipsia. Patient denies any swollen glands, patient denies easy bruising or easy bleeding. Patient denies any recent infections, allergies or URI. Patient "s visual fields have not changed significantly in recent time. ?  ?PHYSICAL EXAM: ?BP 129/72 (BP Location: Right Arm, Patient Position: Sitting)   Pulse 100   Temp  (!) 97.2 ?F (36.2 ?C) (Tympanic)   Resp 16   Ht _0  (1.6 m)   Wt 176 lb 11.2 oz (80.2 kg)   BMI 31.30 kg/m?  ?Left breast is wide local excision scar which is healing well no dominant masses noted in

## 2021-04-15 LAB — CULTURE, BLOOD (SINGLE)
Culture: NO GROWTH
Special Requests: ADEQUATE

## 2021-04-17 ENCOUNTER — Ambulatory Visit: Payer: Medicare Other | Admitting: Infectious Diseases

## 2021-04-17 ENCOUNTER — Ambulatory Visit
Admission: RE | Admit: 2021-04-17 | Discharge: 2021-04-17 | Disposition: A | Discharge status: Home / Self Care | Payer: Medicare Other | Source: Ambulatory Visit | Attending: Radiation Oncology | Admitting: Radiation Oncology

## 2021-04-17 ENCOUNTER — Other Ambulatory Visit: Payer: Self-pay | Admitting: *Deleted

## 2021-04-17 DIAGNOSIS — Z170 Estrogen receptor positive status [ER+]: Secondary | ICD-10-CM | POA: Diagnosis not present

## 2021-04-17 DIAGNOSIS — C50912 Malignant neoplasm of unspecified site of left female breast: Secondary | ICD-10-CM | POA: Diagnosis not present

## 2021-04-22 DIAGNOSIS — C50912 Malignant neoplasm of unspecified site of left female breast: Secondary | ICD-10-CM | POA: Insufficient documentation

## 2021-04-22 DIAGNOSIS — Z170 Estrogen receptor positive status [ER+]: Secondary | ICD-10-CM | POA: Diagnosis not present

## 2021-04-24 ENCOUNTER — Ambulatory Visit: Admission: RE | Admit: 2021-04-24 | Payer: Medicare Other | Source: Ambulatory Visit

## 2021-04-24 DIAGNOSIS — C50912 Malignant neoplasm of unspecified site of left female breast: Secondary | ICD-10-CM | POA: Diagnosis not present

## 2021-04-28 ENCOUNTER — Ambulatory Visit
Admission: RE | Admit: 2021-04-28 | Discharge: 2021-04-28 | Disposition: A | Discharge status: Home / Self Care | Payer: Medicare Other | Source: Ambulatory Visit | Attending: Radiation Oncology | Admitting: Radiation Oncology

## 2021-04-28 DIAGNOSIS — C50912 Malignant neoplasm of unspecified site of left female breast: Secondary | ICD-10-CM | POA: Diagnosis not present

## 2021-04-29 ENCOUNTER — Ambulatory Visit
Admission: RE | Admit: 2021-04-29 | Discharge: 2021-04-29 | Disposition: A | Discharge status: Home / Self Care | Payer: Medicare Other | Source: Ambulatory Visit | Attending: Radiation Oncology | Admitting: Radiation Oncology

## 2021-04-29 DIAGNOSIS — C50912 Malignant neoplasm of unspecified site of left female breast: Secondary | ICD-10-CM | POA: Diagnosis not present

## 2021-04-30 ENCOUNTER — Ambulatory Visit
Admission: RE | Admit: 2021-04-30 | Discharge: 2021-04-30 | Disposition: A | Discharge status: Home / Self Care | Payer: Medicare Other | Source: Ambulatory Visit | Attending: Radiation Oncology | Admitting: Radiation Oncology

## 2021-04-30 DIAGNOSIS — C50912 Malignant neoplasm of unspecified site of left female breast: Secondary | ICD-10-CM | POA: Diagnosis not present

## 2021-05-01 ENCOUNTER — Ambulatory Visit
Admission: RE | Admit: 2021-05-01 | Discharge: 2021-05-01 | Disposition: A | Discharge status: Home / Self Care | Payer: Medicare Other | Source: Ambulatory Visit | Attending: Radiation Oncology | Admitting: Radiation Oncology

## 2021-05-01 DIAGNOSIS — C50912 Malignant neoplasm of unspecified site of left female breast: Secondary | ICD-10-CM | POA: Diagnosis not present

## 2021-05-02 ENCOUNTER — Ambulatory Visit
Admission: RE | Admit: 2021-05-02 | Discharge: 2021-05-02 | Disposition: A | Discharge status: Home / Self Care | Payer: Medicare Other | Source: Ambulatory Visit | Attending: Radiation Oncology | Admitting: Radiation Oncology

## 2021-05-02 DIAGNOSIS — C50912 Malignant neoplasm of unspecified site of left female breast: Secondary | ICD-10-CM | POA: Diagnosis not present

## 2021-05-05 ENCOUNTER — Ambulatory Visit
Admission: RE | Admit: 2021-05-05 | Discharge: 2021-05-05 | Disposition: A | Discharge status: Home / Self Care | Payer: Medicare Other | Source: Ambulatory Visit | Attending: Radiation Oncology | Admitting: Radiation Oncology

## 2021-05-05 DIAGNOSIS — C50912 Malignant neoplasm of unspecified site of left female breast: Secondary | ICD-10-CM | POA: Diagnosis not present

## 2021-05-06 ENCOUNTER — Other Ambulatory Visit: Payer: Self-pay

## 2021-05-06 ENCOUNTER — Ambulatory Visit
Admission: RE | Admit: 2021-05-06 | Discharge: 2021-05-06 | Disposition: A | Discharge status: Home / Self Care | Payer: Medicare Other | Source: Ambulatory Visit | Attending: Radiation Oncology | Admitting: Radiation Oncology

## 2021-05-06 DIAGNOSIS — C50912 Malignant neoplasm of unspecified site of left female breast: Secondary | ICD-10-CM | POA: Diagnosis not present

## 2021-05-06 LAB — RAD ONC ARIA SESSION SUMMARY
Course Elapsed Days: 8
Plan Fractions Treated to Date: 7
Plan Prescribed Dose Per Fraction: 1.8 Gy
Plan Total Fractions Prescribed: 28
Plan Total Prescribed Dose: 50.4 Gy
Reference Point Dosage Given to Date: 12.6 Gy
Reference Point Session Dosage Given: 1.8 Gy
Session Number: 7

## 2021-05-07 ENCOUNTER — Other Ambulatory Visit: Payer: Self-pay

## 2021-05-07 ENCOUNTER — Ambulatory Visit
Admission: RE | Admit: 2021-05-07 | Discharge: 2021-05-07 | Disposition: A | Discharge status: Home / Self Care | Payer: Medicare Other | Source: Ambulatory Visit | Attending: Radiation Oncology | Admitting: Radiation Oncology

## 2021-05-07 DIAGNOSIS — C50912 Malignant neoplasm of unspecified site of left female breast: Secondary | ICD-10-CM | POA: Diagnosis not present

## 2021-05-07 LAB — RAD ONC ARIA SESSION SUMMARY
Course Elapsed Days: 9
Plan Fractions Treated to Date: 8
Plan Prescribed Dose Per Fraction: 1.8 Gy
Plan Total Fractions Prescribed: 28
Plan Total Prescribed Dose: 50.4 Gy
Reference Point Dosage Given to Date: 14.4 Gy
Reference Point Session Dosage Given: 1.8 Gy
Session Number: 8

## 2021-05-08 ENCOUNTER — Other Ambulatory Visit: Payer: Self-pay

## 2021-05-08 ENCOUNTER — Ambulatory Visit
Admission: RE | Admit: 2021-05-08 | Discharge: 2021-05-08 | Disposition: A | Discharge status: Home / Self Care | Payer: Medicare Other | Source: Ambulatory Visit | Attending: Radiation Oncology | Admitting: Radiation Oncology

## 2021-05-08 DIAGNOSIS — C50912 Malignant neoplasm of unspecified site of left female breast: Secondary | ICD-10-CM | POA: Diagnosis not present

## 2021-05-08 LAB — RAD ONC ARIA SESSION SUMMARY
Course Elapsed Days: 10
Plan Fractions Treated to Date: 9
Plan Prescribed Dose Per Fraction: 1.8 Gy
Plan Total Fractions Prescribed: 28
Plan Total Prescribed Dose: 50.4 Gy
Reference Point Dosage Given to Date: 16.2 Gy
Reference Point Session Dosage Given: 1.8 Gy
Session Number: 9

## 2021-05-09 ENCOUNTER — Telehealth: Payer: Self-pay

## 2021-05-09 ENCOUNTER — Ambulatory Visit
Admission: RE | Admit: 2021-05-09 | Discharge: 2021-05-09 | Disposition: A | Discharge status: Home / Self Care | Payer: Medicare Other | Source: Ambulatory Visit | Attending: Radiation Oncology | Admitting: Radiation Oncology

## 2021-05-09 ENCOUNTER — Other Ambulatory Visit: Payer: Self-pay

## 2021-05-09 DIAGNOSIS — C50912 Malignant neoplasm of unspecified site of left female breast: Secondary | ICD-10-CM | POA: Diagnosis not present

## 2021-05-09 LAB — RAD ONC ARIA SESSION SUMMARY
Course Elapsed Days: 11
Plan Fractions Treated to Date: 10
Plan Prescribed Dose Per Fraction: 1.8 Gy
Plan Total Fractions Prescribed: 28
Plan Total Prescribed Dose: 50.4 Gy
Reference Point Dosage Given to Date: 18 Gy
Reference Point Session Dosage Given: 1.8 Gy
Session Number: 10

## 2021-05-09 NOTE — Telephone Encounter (Signed)
Pt's Final XRT is on 5/24. Please schedule MD only follow up approx 2 weeks after that date. Please inform caregiver of appt & POA Tamara Mejia would also like to be informed of appt details as [place] and phones in for visit. Afternoon appt preferred. Thanks.  ?

## 2021-05-09 NOTE — Telephone Encounter (Signed)
Patient's legal guardian [name] advised that patient no longer needs the doxycyline. [name] also stated she will notify the group home as well. Pharmacy notified as well ?

## 2021-05-09 NOTE — Telephone Encounter (Signed)
Lab results are showing up in Epic under [place] for the patient on 04/10/21 ?

## 2021-05-09 NOTE — Telephone Encounter (Signed)
Received a refill request from the pharmacy for patient's doxycycline. Do you want the patient to continue doxycycline. Per last office note patient is to be on doxycycline for 2-4 weeks depending on lab results. ?Marline Morace T Keina Mutch ? ?

## 2021-05-12 ENCOUNTER — Ambulatory Visit
Admission: RE | Admit: 2021-05-12 | Discharge: 2021-05-12 | Disposition: A | Discharge status: Home / Self Care | Payer: Medicare Other | Source: Ambulatory Visit | Attending: Radiation Oncology | Admitting: Radiation Oncology

## 2021-05-12 ENCOUNTER — Inpatient Hospital Stay: Payer: Medicare Other | Attending: Radiation Oncology

## 2021-05-12 ENCOUNTER — Other Ambulatory Visit: Payer: Self-pay

## 2021-05-12 DIAGNOSIS — C50912 Malignant neoplasm of unspecified site of left female breast: Secondary | ICD-10-CM | POA: Diagnosis not present

## 2021-05-12 LAB — RAD ONC ARIA SESSION SUMMARY
Course Elapsed Days: 14
Plan Fractions Treated to Date: 11
Plan Prescribed Dose Per Fraction: 1.8 Gy
Plan Total Fractions Prescribed: 28
Plan Total Prescribed Dose: 50.4 Gy
Reference Point Dosage Given to Date: 19.8 Gy
Reference Point Session Dosage Given: 1.8 Gy
Session Number: 11

## 2021-05-13 ENCOUNTER — Ambulatory Visit
Admission: RE | Admit: 2021-05-13 | Discharge: 2021-05-13 | Disposition: A | Discharge status: Home / Self Care | Payer: Medicare Other | Source: Ambulatory Visit | Attending: Radiation Oncology | Admitting: Radiation Oncology

## 2021-05-13 ENCOUNTER — Encounter: Payer: Self-pay | Admitting: Oncology

## 2021-05-13 ENCOUNTER — Other Ambulatory Visit: Payer: Self-pay

## 2021-05-13 DIAGNOSIS — C50912 Malignant neoplasm of unspecified site of left female breast: Secondary | ICD-10-CM | POA: Diagnosis not present

## 2021-05-13 LAB — RAD ONC ARIA SESSION SUMMARY
Course Elapsed Days: 15
Plan Fractions Treated to Date: 12
Plan Prescribed Dose Per Fraction: 1.8 Gy
Plan Total Fractions Prescribed: 28
Plan Total Prescribed Dose: 50.4 Gy
Reference Point Dosage Given to Date: 21.6 Gy
Reference Point Session Dosage Given: 1.8 Gy
Session Number: 12

## 2021-05-14 ENCOUNTER — Ambulatory Visit
Admission: RE | Admit: 2021-05-14 | Discharge: 2021-05-14 | Disposition: A | Discharge status: Home / Self Care | Payer: Medicare Other | Source: Ambulatory Visit | Attending: Radiation Oncology | Admitting: Radiation Oncology

## 2021-05-14 ENCOUNTER — Other Ambulatory Visit: Payer: Self-pay

## 2021-05-14 DIAGNOSIS — C50912 Malignant neoplasm of unspecified site of left female breast: Secondary | ICD-10-CM | POA: Diagnosis not present

## 2021-05-14 LAB — RAD ONC ARIA SESSION SUMMARY
Course Elapsed Days: 16
Plan Fractions Treated to Date: 13
Plan Prescribed Dose Per Fraction: 1.8 Gy
Plan Total Fractions Prescribed: 28
Plan Total Prescribed Dose: 50.4 Gy
Reference Point Dosage Given to Date: 23.4 Gy
Reference Point Session Dosage Given: 1.8 Gy
Session Number: 13

## 2021-05-15 ENCOUNTER — Ambulatory Visit
Admission: RE | Admit: 2021-05-15 | Discharge: 2021-05-15 | Disposition: A | Discharge status: Home / Self Care | Payer: Medicare Other | Source: Ambulatory Visit | Attending: Radiation Oncology | Admitting: Radiation Oncology

## 2021-05-15 ENCOUNTER — Other Ambulatory Visit: Payer: Self-pay

## 2021-05-15 DIAGNOSIS — C50912 Malignant neoplasm of unspecified site of left female breast: Secondary | ICD-10-CM | POA: Diagnosis not present

## 2021-05-15 LAB — RAD ONC ARIA SESSION SUMMARY
Course Elapsed Days: 17
Plan Fractions Treated to Date: 14
Plan Prescribed Dose Per Fraction: 1.8 Gy
Plan Total Fractions Prescribed: 28
Plan Total Prescribed Dose: 50.4 Gy
Reference Point Dosage Given to Date: 25.2 Gy
Reference Point Session Dosage Given: 1.8 Gy
Session Number: 14

## 2021-05-16 ENCOUNTER — Other Ambulatory Visit: Payer: Self-pay

## 2021-05-16 ENCOUNTER — Ambulatory Visit
Admission: RE | Admit: 2021-05-16 | Discharge: 2021-05-16 | Disposition: A | Discharge status: Home / Self Care | Payer: Medicare Other | Source: Ambulatory Visit | Attending: Radiation Oncology | Admitting: Radiation Oncology

## 2021-05-16 DIAGNOSIS — C50912 Malignant neoplasm of unspecified site of left female breast: Secondary | ICD-10-CM | POA: Diagnosis not present

## 2021-05-16 LAB — RAD ONC ARIA SESSION SUMMARY
Course Elapsed Days: 18
Plan Fractions Treated to Date: 15
Plan Prescribed Dose Per Fraction: 1.8 Gy
Plan Total Fractions Prescribed: 28
Plan Total Prescribed Dose: 50.4 Gy
Reference Point Dosage Given to Date: 27 Gy
Reference Point Session Dosage Given: 1.8 Gy
Session Number: 15

## 2021-05-19 ENCOUNTER — Ambulatory Visit
Admission: RE | Admit: 2021-05-19 | Discharge: 2021-05-19 | Disposition: A | Discharge status: Home / Self Care | Payer: Medicare Other | Source: Ambulatory Visit | Attending: Radiation Oncology | Admitting: Radiation Oncology

## 2021-05-19 ENCOUNTER — Other Ambulatory Visit: Payer: Self-pay

## 2021-05-19 DIAGNOSIS — Z170 Estrogen receptor positive status [ER+]: Secondary | ICD-10-CM | POA: Insufficient documentation

## 2021-05-19 DIAGNOSIS — C50912 Malignant neoplasm of unspecified site of left female breast: Secondary | ICD-10-CM | POA: Insufficient documentation

## 2021-05-19 LAB — RAD ONC ARIA SESSION SUMMARY
Course Elapsed Days: 21
Plan Fractions Treated to Date: 16
Plan Prescribed Dose Per Fraction: 1.8 Gy
Plan Total Fractions Prescribed: 28
Plan Total Prescribed Dose: 50.4 Gy
Reference Point Dosage Given to Date: 28.8 Gy
Reference Point Session Dosage Given: 1.8 Gy
Session Number: 16

## 2021-05-20 ENCOUNTER — Other Ambulatory Visit: Payer: Self-pay

## 2021-05-20 ENCOUNTER — Ambulatory Visit
Admission: RE | Admit: 2021-05-20 | Discharge: 2021-05-20 | Disposition: A | Discharge status: Home / Self Care | Payer: Medicare Other | Source: Ambulatory Visit | Attending: Radiation Oncology | Admitting: Radiation Oncology

## 2021-05-20 DIAGNOSIS — C50912 Malignant neoplasm of unspecified site of left female breast: Secondary | ICD-10-CM | POA: Diagnosis not present

## 2021-05-20 LAB — RAD ONC ARIA SESSION SUMMARY
Course Elapsed Days: 22
Plan Fractions Treated to Date: 17
Plan Prescribed Dose Per Fraction: 1.8 Gy
Plan Total Fractions Prescribed: 28
Plan Total Prescribed Dose: 50.4 Gy
Reference Point Dosage Given to Date: 30.6 Gy
Reference Point Session Dosage Given: 1.8 Gy
Session Number: 17

## 2021-05-21 ENCOUNTER — Ambulatory Visit
Admission: RE | Admit: 2021-05-21 | Discharge: 2021-05-21 | Disposition: A | Discharge status: Home / Self Care | Payer: Medicare Other | Source: Ambulatory Visit | Attending: Radiation Oncology | Admitting: Radiation Oncology

## 2021-05-21 ENCOUNTER — Other Ambulatory Visit: Payer: Self-pay

## 2021-05-21 DIAGNOSIS — C50912 Malignant neoplasm of unspecified site of left female breast: Secondary | ICD-10-CM | POA: Diagnosis not present

## 2021-05-21 LAB — RAD ONC ARIA SESSION SUMMARY
Course Elapsed Days: 23
Plan Fractions Treated to Date: 18
Plan Prescribed Dose Per Fraction: 1.8 Gy
Plan Total Fractions Prescribed: 28
Plan Total Prescribed Dose: 50.4 Gy
Reference Point Dosage Given to Date: 32.4 Gy
Reference Point Session Dosage Given: 1.8 Gy
Session Number: 18

## 2021-05-22 ENCOUNTER — Ambulatory Visit
Admission: RE | Admit: 2021-05-22 | Discharge: 2021-05-22 | Disposition: A | Discharge status: Home / Self Care | Payer: Medicare Other | Source: Ambulatory Visit | Attending: Radiation Oncology | Admitting: Radiation Oncology

## 2021-05-22 ENCOUNTER — Other Ambulatory Visit: Payer: Self-pay

## 2021-05-22 ENCOUNTER — Ambulatory Visit: Payer: Medicare Other

## 2021-05-22 DIAGNOSIS — C50912 Malignant neoplasm of unspecified site of left female breast: Secondary | ICD-10-CM | POA: Diagnosis not present

## 2021-05-22 LAB — RAD ONC ARIA SESSION SUMMARY
Course Elapsed Days: 24
Plan Fractions Treated to Date: 19
Plan Prescribed Dose Per Fraction: 1.8 Gy
Plan Total Fractions Prescribed: 28
Plan Total Prescribed Dose: 50.4 Gy
Reference Point Dosage Given to Date: 34.2 Gy
Reference Point Session Dosage Given: 1.8 Gy
Session Number: 19

## 2021-05-23 ENCOUNTER — Ambulatory Visit: Payer: Medicare Other

## 2021-05-26 ENCOUNTER — Inpatient Hospital Stay: Payer: Medicare Other

## 2021-05-26 ENCOUNTER — Other Ambulatory Visit: Payer: Self-pay

## 2021-05-26 ENCOUNTER — Ambulatory Visit
Admission: RE | Admit: 2021-05-26 | Discharge: 2021-05-26 | Disposition: A | Discharge status: Home / Self Care | Payer: Medicare Other | Source: Ambulatory Visit | Attending: Radiation Oncology | Admitting: Radiation Oncology

## 2021-05-26 DIAGNOSIS — Z170 Estrogen receptor positive status [ER+]: Secondary | ICD-10-CM | POA: Insufficient documentation

## 2021-05-26 DIAGNOSIS — C50912 Malignant neoplasm of unspecified site of left female breast: Secondary | ICD-10-CM | POA: Insufficient documentation

## 2021-05-26 LAB — RAD ONC ARIA SESSION SUMMARY
Course Elapsed Days: 28
Plan Fractions Treated to Date: 20
Plan Prescribed Dose Per Fraction: 1.8 Gy
Plan Total Fractions Prescribed: 28
Plan Total Prescribed Dose: 50.4 Gy
Reference Point Dosage Given to Date: 36 Gy
Reference Point Session Dosage Given: 1.8 Gy
Session Number: 20

## 2021-05-26 LAB — CBC
HCT: 37.5 % (ref 36.0–46.0)
Hemoglobin: 12.3 g/dL (ref 12.0–15.0)
MCH: 29.7 pg (ref 26.0–34.0)
MCHC: 32.8 g/dL (ref 30.0–36.0)
MCV: 90.6 fL (ref 80.0–100.0)
Platelets: 201 10*3/uL (ref 150–400)
RBC: 4.14 MIL/uL (ref 3.87–5.11)
RDW: 13.1 % (ref 11.5–15.5)
WBC: 4.4 10*3/uL (ref 4.0–10.5)
nRBC: 0 % (ref 0.0–0.2)

## 2021-05-27 ENCOUNTER — Ambulatory Visit
Admission: RE | Admit: 2021-05-27 | Discharge: 2021-05-27 | Disposition: A | Discharge status: Home / Self Care | Payer: Medicare Other | Source: Ambulatory Visit | Attending: Radiation Oncology | Admitting: Radiation Oncology

## 2021-05-27 ENCOUNTER — Other Ambulatory Visit: Payer: Self-pay

## 2021-05-27 DIAGNOSIS — C50912 Malignant neoplasm of unspecified site of left female breast: Secondary | ICD-10-CM | POA: Diagnosis not present

## 2021-05-27 LAB — RAD ONC ARIA SESSION SUMMARY
Course Elapsed Days: 29
Plan Fractions Treated to Date: 21
Plan Prescribed Dose Per Fraction: 1.8 Gy
Plan Total Fractions Prescribed: 28
Plan Total Prescribed Dose: 50.4 Gy
Reference Point Dosage Given to Date: 37.8 Gy
Reference Point Session Dosage Given: 1.8 Gy
Session Number: 21

## 2021-05-28 ENCOUNTER — Ambulatory Visit: Payer: Medicare Other

## 2021-05-28 ENCOUNTER — Ambulatory Visit
Admission: RE | Admit: 2021-05-28 | Discharge: 2021-05-28 | Disposition: A | Discharge status: Home / Self Care | Payer: Medicare Other | Source: Ambulatory Visit | Attending: Radiation Oncology | Admitting: Radiation Oncology

## 2021-05-28 ENCOUNTER — Other Ambulatory Visit: Payer: Self-pay

## 2021-05-28 DIAGNOSIS — C50912 Malignant neoplasm of unspecified site of left female breast: Secondary | ICD-10-CM | POA: Diagnosis not present

## 2021-05-28 LAB — RAD ONC ARIA SESSION SUMMARY
Course Elapsed Days: 30
Plan Fractions Treated to Date: 22
Plan Prescribed Dose Per Fraction: 1.8 Gy
Plan Total Fractions Prescribed: 28
Plan Total Prescribed Dose: 50.4 Gy
Reference Point Dosage Given to Date: 39.6 Gy
Reference Point Session Dosage Given: 1.8 Gy
Session Number: 22

## 2021-05-29 ENCOUNTER — Other Ambulatory Visit: Payer: Self-pay

## 2021-05-29 ENCOUNTER — Ambulatory Visit
Admission: RE | Admit: 2021-05-29 | Discharge: 2021-05-29 | Disposition: A | Discharge status: Home / Self Care | Payer: Medicare Other | Source: Ambulatory Visit | Attending: Radiation Oncology | Admitting: Radiation Oncology

## 2021-05-29 DIAGNOSIS — C50912 Malignant neoplasm of unspecified site of left female breast: Secondary | ICD-10-CM | POA: Diagnosis not present

## 2021-05-29 LAB — RAD ONC ARIA SESSION SUMMARY
Course Elapsed Days: 31
Plan Fractions Treated to Date: 23
Plan Prescribed Dose Per Fraction: 1.8 Gy
Plan Total Fractions Prescribed: 28
Plan Total Prescribed Dose: 50.4 Gy
Reference Point Dosage Given to Date: 41.4 Gy
Reference Point Session Dosage Given: 1.8 Gy
Session Number: 23

## 2021-05-30 ENCOUNTER — Other Ambulatory Visit: Payer: Self-pay

## 2021-05-30 ENCOUNTER — Ambulatory Visit
Admission: RE | Admit: 2021-05-30 | Discharge: 2021-05-30 | Disposition: A | Discharge status: Home / Self Care | Payer: Medicare Other | Source: Ambulatory Visit | Attending: Radiation Oncology | Admitting: Radiation Oncology

## 2021-05-30 DIAGNOSIS — C50912 Malignant neoplasm of unspecified site of left female breast: Secondary | ICD-10-CM | POA: Diagnosis not present

## 2021-05-30 LAB — RAD ONC ARIA SESSION SUMMARY
Course Elapsed Days: 32
Plan Fractions Treated to Date: 24
Plan Prescribed Dose Per Fraction: 1.8 Gy
Plan Total Fractions Prescribed: 28
Plan Total Prescribed Dose: 50.4 Gy
Reference Point Dosage Given to Date: 43.2 Gy
Reference Point Session Dosage Given: 1.8 Gy
Session Number: 24

## 2021-06-02 ENCOUNTER — Ambulatory Visit
Admission: RE | Admit: 2021-06-02 | Discharge: 2021-06-02 | Disposition: A | Discharge status: Home / Self Care | Payer: Medicare Other | Source: Ambulatory Visit | Attending: Radiation Oncology | Admitting: Radiation Oncology

## 2021-06-02 ENCOUNTER — Other Ambulatory Visit: Payer: Self-pay

## 2021-06-02 DIAGNOSIS — C50912 Malignant neoplasm of unspecified site of left female breast: Secondary | ICD-10-CM | POA: Diagnosis not present

## 2021-06-02 LAB — RAD ONC ARIA SESSION SUMMARY
Course Elapsed Days: 35
Plan Fractions Treated to Date: 25
Plan Prescribed Dose Per Fraction: 1.8 Gy
Plan Total Fractions Prescribed: 28
Plan Total Prescribed Dose: 50.4 Gy
Reference Point Dosage Given to Date: 45 Gy
Reference Point Session Dosage Given: 1.8 Gy
Session Number: 25

## 2021-06-03 ENCOUNTER — Other Ambulatory Visit: Payer: Self-pay

## 2021-06-03 ENCOUNTER — Ambulatory Visit
Admission: RE | Admit: 2021-06-03 | Discharge: 2021-06-03 | Disposition: A | Discharge status: Home / Self Care | Payer: Medicare Other | Source: Ambulatory Visit | Attending: Radiation Oncology | Admitting: Radiation Oncology

## 2021-06-03 DIAGNOSIS — C50912 Malignant neoplasm of unspecified site of left female breast: Secondary | ICD-10-CM | POA: Diagnosis not present

## 2021-06-03 LAB — RAD ONC ARIA SESSION SUMMARY
Course Elapsed Days: 36
Plan Fractions Treated to Date: 26
Plan Prescribed Dose Per Fraction: 1.8 Gy
Plan Total Fractions Prescribed: 28
Plan Total Prescribed Dose: 50.4 Gy
Reference Point Dosage Given to Date: 46.8 Gy
Reference Point Session Dosage Given: 1.8 Gy
Session Number: 26

## 2021-06-04 ENCOUNTER — Ambulatory Visit
Admission: RE | Admit: 2021-06-04 | Discharge: 2021-06-04 | Disposition: A | Discharge status: Home / Self Care | Payer: Medicare Other | Source: Ambulatory Visit | Attending: Radiation Oncology | Admitting: Radiation Oncology

## 2021-06-04 ENCOUNTER — Other Ambulatory Visit: Payer: Self-pay

## 2021-06-04 DIAGNOSIS — C50912 Malignant neoplasm of unspecified site of left female breast: Secondary | ICD-10-CM | POA: Diagnosis not present

## 2021-06-04 LAB — RAD ONC ARIA SESSION SUMMARY
Course Elapsed Days: 37
Plan Fractions Treated to Date: 27
Plan Prescribed Dose Per Fraction: 1.8 Gy
Plan Total Fractions Prescribed: 28
Plan Total Prescribed Dose: 50.4 Gy
Reference Point Dosage Given to Date: 48.6 Gy
Reference Point Session Dosage Given: 1.8 Gy
Session Number: 27

## 2021-06-05 ENCOUNTER — Other Ambulatory Visit: Payer: Self-pay

## 2021-06-05 ENCOUNTER — Ambulatory Visit: Payer: Medicare Other

## 2021-06-05 ENCOUNTER — Ambulatory Visit
Admission: RE | Admit: 2021-06-05 | Discharge: 2021-06-05 | Disposition: A | Discharge status: Home / Self Care | Payer: Medicare Other | Source: Ambulatory Visit | Attending: Radiation Oncology | Admitting: Radiation Oncology

## 2021-06-05 DIAGNOSIS — C50912 Malignant neoplasm of unspecified site of left female breast: Secondary | ICD-10-CM | POA: Diagnosis not present

## 2021-06-05 LAB — RAD ONC ARIA SESSION SUMMARY
Course Elapsed Days: 38
Plan Fractions Treated to Date: 28
Plan Prescribed Dose Per Fraction: 1.8 Gy
Plan Total Fractions Prescribed: 28
Plan Total Prescribed Dose: 50.4 Gy
Reference Point Dosage Given to Date: 50.4 Gy
Reference Point Session Dosage Given: 1.8 Gy
Session Number: 28

## 2021-06-06 ENCOUNTER — Ambulatory Visit: Payer: Medicare Other

## 2021-06-06 ENCOUNTER — Ambulatory Visit
Admission: RE | Admit: 2021-06-06 | Discharge: 2021-06-06 | Disposition: A | Discharge status: Home / Self Care | Payer: Medicare Other | Source: Ambulatory Visit | Attending: Radiation Oncology | Admitting: Radiation Oncology

## 2021-06-06 ENCOUNTER — Other Ambulatory Visit: Payer: Self-pay

## 2021-06-06 DIAGNOSIS — C50912 Malignant neoplasm of unspecified site of left female breast: Secondary | ICD-10-CM | POA: Diagnosis not present

## 2021-06-06 LAB — RAD ONC ARIA SESSION SUMMARY
Course Elapsed Days: 39
Plan Fractions Treated to Date: 1
Plan Prescribed Dose Per Fraction: 2 Gy
Plan Total Fractions Prescribed: 5
Plan Total Prescribed Dose: 10 Gy
Reference Point Dosage Given to Date: 52.4 Gy
Reference Point Session Dosage Given: 2 Gy
Session Number: 29

## 2021-06-09 ENCOUNTER — Ambulatory Visit
Admission: RE | Admit: 2021-06-09 | Discharge: 2021-06-09 | Disposition: A | Discharge status: Home / Self Care | Payer: Medicare Other | Source: Ambulatory Visit | Attending: Radiation Oncology | Admitting: Radiation Oncology

## 2021-06-09 ENCOUNTER — Other Ambulatory Visit: Payer: Self-pay

## 2021-06-09 DIAGNOSIS — C50912 Malignant neoplasm of unspecified site of left female breast: Secondary | ICD-10-CM | POA: Diagnosis not present

## 2021-06-09 LAB — RAD ONC ARIA SESSION SUMMARY
Course Elapsed Days: 42
Plan Fractions Treated to Date: 2
Plan Prescribed Dose Per Fraction: 2 Gy
Plan Total Fractions Prescribed: 5
Plan Total Prescribed Dose: 10 Gy
Reference Point Dosage Given to Date: 54.4 Gy
Reference Point Session Dosage Given: 2 Gy
Session Number: 30

## 2021-06-10 ENCOUNTER — Ambulatory Visit
Admission: RE | Admit: 2021-06-10 | Discharge: 2021-06-10 | Disposition: A | Discharge status: Home / Self Care | Payer: Medicare Other | Source: Ambulatory Visit | Attending: Radiation Oncology | Admitting: Radiation Oncology

## 2021-06-10 ENCOUNTER — Other Ambulatory Visit: Payer: Self-pay

## 2021-06-10 DIAGNOSIS — C50912 Malignant neoplasm of unspecified site of left female breast: Secondary | ICD-10-CM | POA: Diagnosis not present

## 2021-06-10 LAB — RAD ONC ARIA SESSION SUMMARY
Course Elapsed Days: 43
Plan Fractions Treated to Date: 3
Plan Prescribed Dose Per Fraction: 2 Gy
Plan Total Fractions Prescribed: 5
Plan Total Prescribed Dose: 10 Gy
Reference Point Dosage Given to Date: 56.4 Gy
Reference Point Session Dosage Given: 2 Gy
Session Number: 31

## 2021-06-11 ENCOUNTER — Other Ambulatory Visit: Payer: Self-pay

## 2021-06-11 ENCOUNTER — Ambulatory Visit: Payer: Medicare Other

## 2021-06-11 ENCOUNTER — Ambulatory Visit
Admission: RE | Admit: 2021-06-11 | Discharge: 2021-06-11 | Disposition: A | Discharge status: Home / Self Care | Payer: Medicare Other | Source: Ambulatory Visit | Attending: Radiation Oncology | Admitting: Radiation Oncology

## 2021-06-11 ENCOUNTER — Encounter: Payer: Self-pay | Admitting: Oncology

## 2021-06-11 DIAGNOSIS — C50912 Malignant neoplasm of unspecified site of left female breast: Secondary | ICD-10-CM | POA: Diagnosis not present

## 2021-06-11 LAB — RAD ONC ARIA SESSION SUMMARY
Course Elapsed Days: 44
Plan Fractions Treated to Date: 4
Plan Prescribed Dose Per Fraction: 2 Gy
Plan Total Fractions Prescribed: 5
Plan Total Prescribed Dose: 10 Gy
Reference Point Dosage Given to Date: 58.4 Gy
Reference Point Session Dosage Given: 2 Gy
Session Number: 32

## 2021-06-12 ENCOUNTER — Ambulatory Visit
Admission: RE | Admit: 2021-06-12 | Discharge: 2021-06-12 | Disposition: A | Discharge status: Home / Self Care | Payer: Medicare Other | Source: Ambulatory Visit | Attending: Radiation Oncology | Admitting: Radiation Oncology

## 2021-06-12 ENCOUNTER — Other Ambulatory Visit: Payer: Self-pay

## 2021-06-12 DIAGNOSIS — C50912 Malignant neoplasm of unspecified site of left female breast: Secondary | ICD-10-CM | POA: Diagnosis not present

## 2021-06-12 LAB — RAD ONC ARIA SESSION SUMMARY
Course Elapsed Days: 45
Plan Fractions Treated to Date: 5
Plan Prescribed Dose Per Fraction: 2 Gy
Plan Total Fractions Prescribed: 5
Plan Total Prescribed Dose: 10 Gy
Reference Point Dosage Given to Date: 60.4 Gy
Reference Point Session Dosage Given: 2 Gy
Session Number: 33

## 2021-06-25 ENCOUNTER — Inpatient Hospital Stay: Payer: Medicare Other | Attending: Oncology | Admitting: Oncology

## 2021-06-25 ENCOUNTER — Encounter: Payer: Self-pay | Admitting: Oncology

## 2021-06-25 VITALS — BP 131/69 | HR 73 | Temp 96.2°F | Resp 18 | Wt 174.0 lb

## 2021-06-25 DIAGNOSIS — Z803 Family history of malignant neoplasm of breast: Secondary | ICD-10-CM | POA: Diagnosis not present

## 2021-06-25 DIAGNOSIS — Z923 Personal history of irradiation: Secondary | ICD-10-CM | POA: Diagnosis not present

## 2021-06-25 DIAGNOSIS — C50912 Malignant neoplasm of unspecified site of left female breast: Secondary | ICD-10-CM | POA: Diagnosis present

## 2021-06-25 DIAGNOSIS — I10 Essential (primary) hypertension: Secondary | ICD-10-CM | POA: Diagnosis not present

## 2021-06-25 DIAGNOSIS — Z90710 Acquired absence of both cervix and uterus: Secondary | ICD-10-CM | POA: Diagnosis not present

## 2021-06-25 DIAGNOSIS — E119 Type 2 diabetes mellitus without complications: Secondary | ICD-10-CM | POA: Insufficient documentation

## 2021-06-25 DIAGNOSIS — Z79811 Long term (current) use of aromatase inhibitors: Secondary | ICD-10-CM | POA: Insufficient documentation

## 2021-06-25 DIAGNOSIS — Z170 Estrogen receptor positive status [ER+]: Secondary | ICD-10-CM | POA: Diagnosis not present

## 2021-06-25 MED ORDER — CALCIUM CARBONATE 600 MG PO TABS: 600.0000 mg | ORAL_TABLET | Freq: Two times a day (BID) | ORAL | 3 refills | Status: DC

## 2021-06-25 MED ORDER — ANASTROZOLE 1 MG PO TABS
1.0000 mg | ORAL_TABLET | Freq: Every day | ORAL | 3 refills | Status: DC
Start: 2021-06-25 — End: 2021-10-22

## 2021-06-25 NOTE — Progress Notes (Signed)
Pt here for follow up. No new concerns voiced.  Caregiver present and reports pt got out of hospital in March.

## 2021-06-25 NOTE — Progress Notes (Signed)
Hematology/Oncology Progress note [phone] [phone]      Patient Care Team: Kirk Ruths, MD as PCP - General (Internal Medicine) Theodore Demark, RN (Inactive) as Oncology Nurse Navigator Tsosie Billing, MD as Consulting Physician (Infectious Diseases) Earlie Server, MD as Consulting Physician (Oncology)  REFERRING PROVIDER: Kirk Ruths, MD  CHIEF COMPLAINTS/REASON FOR VISIT:  left breast cancer  HISTORY OF PRESENTING ILLNESS:   Tamara Mejia is a  71 y.o.  female with PMH listed below was seen in consultation at the request of  Kirk Ruths, MD  for evaluation of left breast cancer.  She has screening mammogram done which recommends additional left breast diagnostic mammogram.  09/26/2020 unilateral left diagnostic mammogram showed there is a suspicious 17 mm mass in the LEFT upper breast which is concerning for malignancy. Recommend ultrasound-guided biopsy for definitive characterization. No suspicious LEFT axillary adenopathy.  10/08/2020 left breast US guided biopsy showed invasive mammary carcinoma, no special type. Grade 3, no DCIS, no LVI.  ER 51-90%, PR 51-90%, HER2 negative.   Family history of breast cancer: maternal cousin breast cancer. Great aunt breast cancer.  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  lives in a group home. She has a guardian, Geraldo Pitter who is her maternal cousin.   11/22/2020 stage IB left breast cancer status postlumpectomy and sentinel lymph node biopsy, ER/PR+, HER2 - pT1c pN1a M0 Mammaprint came back high risk, 29% recurrence risk in 10 years  #01/08/2021 start adjuvant TC, s/p 2 cycles.   02/14/21- 02/28/2021  admission due to MSSA bacteremia due to infected medi port.  Medi port was removed on 02/18/2021.  Small abscess seen on MRI of the lumbar spine on 02/22/2021.  Too small to drain  TEE from 02/19/21 NEG She was seen by ID and treated with IV antibiotics. Picc line was placed and patient finished total of 6 weeks of IV antibiotics. PICC line removal.   INTERVAL HISTORY DAIELLE Mejia is a 71 y.o. female who has above history reviewed by me today presents for follow up visit for left breast cancer.  4/10/ 2023-06/05/2021, status post adjuvant left breast radiation.  Overall she tolerates well. Patient presents to discuss adjuvant endocrine therapyShe was accompanied with caregiver Aniceto Boss.  Patient's POA Ms. Karlene Lineman was called during the encounter.  Review of Systems  Constitutional:  Negative for appetite change, chills, fatigue and fever.  HENT:   Negative for hearing loss and voice change.   Eyes:  Negative for eye problems.  Respiratory:  Negative for chest tightness and cough.   Cardiovascular:  Negative for chest pain.  Gastrointestinal:  Negative for abdominal distention, abdominal pain, blood in stool and nausea.  Endocrine: Negative for hot flashes.  Genitourinary:  Negative for difficulty urinating and frequency.   Musculoskeletal:  Negative for arthralgias.  Skin:  Negative for itching and rash.  Neurological:  Negative for extremity weakness.  Hematological:  Negative for adenopathy.  Psychiatric/Behavioral:  Negative for confusion.    MEDICAL HISTORY:  Past Medical History:  Diagnosis Date   Breast cancer ([place])    Cognitive dysfunction    mental retardation (mild to moderate)   Diabetes mellitus, type 2 ([facility])    diet controlled   Family history of breast cancer  High cholesterol    History of 2019 novel coronavirus disease (COVID-19) 11/04/2020   a.) resides in group home; others were sick with SARS-Cov-2. Home test (+).   Hypertension    Meningitis    in childhood   Obesity  (BMI 30-39.9)    Overactive bladder    Seizure ([place])    last seizure 2000    SURGICAL HISTORY: Past Surgical History:  Procedure Laterality Date   ABDOMINAL HYSTERECTOMY     as a teenager   BREAST BIOPSY Right    neg   BREAST BIOPSY Left 10/18/2020   u/s breast  biopsy, "venus" clip-INVASIVE MAMMARY CARCINOMA   CHOLECYSTECTOMY     COLONOSCOPY WITH PROPOFOL N/A 08/27/2015   Procedure: COLONOSCOPY WITH PROPOFOL;  Surgeon: Lucilla Lame, MD;  Location: [facility];  Service: Endoscopy;  Laterality: N/A;   PART MASTECTOMY,RADIO FREQUENCY LOCALIZER,AXILLARY SENTINEL NODE BIOPSY Left 11/22/2020   Procedure: PART MASTECTOMY, RADIO FREQUENCY LOCALIZER,AXILLARY SENTINEL NODE BIOPSY;  Surgeon: Herbert Pun, MD;  Location: [facility];  Service: General;  Laterality: Left;   PORTA CATH REMOVAL N/A 02/18/2021   Procedure: PORTA CATH REMOVAL;  Surgeon: Katha Cabal, MD;  Location: [place] [facility];  Service: Cardiovascular;  Laterality: N/A;   PORTACATH PLACEMENT N/A 12/31/2020   Procedure: INSERTION PORT-A-CATH;  Surgeon: Herbert Pun, MD;  Location: [facility];  Service: General;  Laterality: N/A;   TEE WITHOUT CARDIOVERSION N/A 02/19/2021   Procedure: TRANSESOPHAGEAL ECHOCARDIOGRAM (TEE);  Surgeon: Corey Skains, MD;  Location: [facility];  Service: Cardiovascular;  Laterality: N/A;    SOCIAL HISTORY: Social History   Socioeconomic History   Marital status: Single    Spouse name: Not on file   Number of children: Not on file   Years of education: Not on file   Highest education level: Not on file  Occupational History   Not on file  Tobacco Use   Smoking status: Never   Smokeless tobacco: Never  Substance and Sexual Activity   Alcohol use: No   Drug use: No   Sexual activity: Not on file  Other Topics Concern   Not on file  Social History Narrative   Patient works for cutting [employer]   Patient has her adult education certificate   Patient drinks about 2  cups of coffee daily.          Social Determinants of Health   Financial Resource Strain: Not on file  Food Insecurity: Not on file  Transportation Needs: Not on file  Physical Activity: Not on file  Stress: Not on file  Social Connections: Not on file  Intimate Partner Violence: Not on file    FAMILY HISTORY: Family History  Problem Relation Age of Onset   Diabetes Father    Heart disease Father    Heart disease Brother    Breast cancer Cousin        dx 68s    ALLERGIES:  has No Known Allergies.  MEDICATIONS:  Current Outpatient Medications  Medication Sig Dispense Refill   acetaminophen (TYLENOL) 500 MG tablet Take 1,000 mg by mouth every 8 (eight) hours as needed (pain.).     amLODipine (NORVASC) 5 MG tablet Take 5 mg by mouth in the morning.     anastrozole (ARIMIDEX) 1 MG tablet Take 1 tablet (1 mg total) by mouth daily. 30 tablet 3   aspirin 81 MG EC tablet Take 81 mg by mouth in the morning. Swallow whole.     calcium carbonate (CALCIUM 600) 600  MG TABS tablet Take 1 tablet (600 mg total) by mouth 2 (two) times daily with a meal. 180 tablet 3   carbamazepine (TEGRETOL) 200 MG tablet TAKE ONE TABLET BY MOUTH THREE TIMES DAILY. (SEIZURE CONTROL) 90 tablet 11   Cholecalciferol (VITAMIN D) 50 MCG (2000 UT) tablet Take 4,000 Units by mouth in the morning.     citalopram (CELEXA) 10 MG tablet Take 1 tablet (10 mg total) by mouth daily. 30 tablet 0   hydrochlorothiazide (HYDRODIURIL) 25 MG tablet Take 25 mg by mouth daily.     lisinopril (ZESTRIL) 20 MG tablet Take 20 mg by mouth daily.     metFORMIN (GLUCOPHAGE) 500 MG tablet Take by mouth 2 (two) times daily with a meal.     metoprolol succinate (TOPROL-XL) 50 MG 24 hr tablet Take 1 tablet (50 mg total) by mouth at bedtime. Take with or immediately following a meal. 30 tablet 0   ondansetron (ZOFRAN) 8 MG tablet Take 1 tablet (8 mg total) by mouth 2 (two) times daily as needed for refractory nausea / vomiting. Start on day  3 after chemo. 30 tablet 1   ondansetron (ZOFRAN-ODT) 4 MG disintegrating tablet Take by mouth.     rosuvastatin (CRESTOR) 20 MG tablet Take 20 mg by mouth at bedtime.     atorvastatin (LIPITOR) 10 MG tablet Take 10 mg by mouth daily. (Patient not taking: Reported on 06/25/2021)     doxycycline (VIBRA-TABS) 100 MG tablet Take 1 tablet (100 mg total) by mouth 2 (two) times daily. (Patient not taking: Reported on 06/25/2021) 60 tablet 0   loratadine (CLARITIN) 10 MG tablet Take 1 tablet (10 mg total) by mouth daily. Take Claritin daily for 4 days, Day1-4 of each chemo cycle. (Patient not taking: Reported on 04/14/2021) 16 tablet 0   prochlorperazine (COMPAZINE) 10 MG tablet Take 1 tablet (10 mg total) by mouth every 6 (six) hours as needed (Nausea or vomiting). (Patient not taking: Reported on 04/14/2021) 30 tablet 1   No current facility-administered medications for this visit.     PHYSICAL EXAMINATION: ECOG PERFORMANCE STATUS: 2 - Symptomatic, <50% confined to bed Vitals:   06/25/21 1438  BP: 131/69  Pulse: 73  Resp: 18  Temp: (!) 96.2 F (35.7 C)   Filed Weights   06/25/21 1438  Weight: 174 lb (78.9 kg)    Physical Exam Constitutional:      General: She is not in acute distress. HENT:     Head: Normocephalic and atraumatic.  Eyes:     General: No scleral icterus. Cardiovascular:     Rate and Rhythm: Normal rate and regular rhythm.     Heart sounds: Normal heart sounds.  Pulmonary:     Effort: Pulmonary effort is normal. No respiratory distress.     Breath sounds: No wheezing.  Abdominal:     General: Bowel sounds are normal. There is no distension.     Palpations: Abdomen is soft.  Musculoskeletal:        General: No deformity. Normal range of motion.     Cervical back: Normal range of motion and neck supple.  Skin:    General: Skin is warm and dry.     Findings: No erythema or rash.  Neurological:     Mental Status: She is alert and oriented to person, place, and time.  Mental status is at baseline.     Cranial Nerves: No cranial nerve deficit.     Coordination: Coordination normal.  Psychiatric:  Mood and Affect: Mood normal.    LABORATORY DATA:  I have reviewed the data as listed Lab Results  Component Value Date   WBC 4.4 05/26/2021   HGB 12.3 05/26/2021   HCT 37.5 05/26/2021   MCV 90.6 05/26/2021   PLT 201 05/26/2021   Recent Labs    02/14/21 1440 02/15/21 0021 02/22/21 0425 02/24/21 0430 02/26/21 1119 02/27/21 0334 02/28/21 0458  NA  --    < > 130* 133* 134* 134* 132*  K  --    < > 4.2 4.3 4.0 3.9 3.8  CL  --    < > 98 100 98 100 99  CO2  --    < > $R'27 24 27 26 27  'Nd$ GLUCOSE  --    < > 154* 187* 220* 171* 162*  BUN  --    < > $R'12 18 16 15 12  'is$ CREATININE  --    < > 0.60 0.68 0.58 0.62 0.59  CALCIUM  --    < > 8.4* 8.4* 8.3* 8.2* 8.3*  GFRNONAA  --    < > >60 >60 >60 >60 >60  PROT 6.5   < > 7.2 7.1 6.9  --   --   ALBUMIN 2.4*   < > 2.2* 2.3* 2.1*  --   --   AST 31   < > 58* 40 19  --   --   ALT 67*   < > 65* 59* 33  --   --   ALKPHOS 67   < > 75 66 66  --   --   BILITOT 0.6   < > 0.3 0.5 0.6  --   --   BILIDIR 0.2  --   --  0.1  --   --   --   IBILI 0.4  --   --  0.4  --   --   --    < > = values in this interval not displayed.    Iron/TIBC/Ferritin/ %Sat    Component Value Date/Time   IRON 14 (L) 02/18/2021 0422   TIBC 99 (L) 02/18/2021 0422   FERRITIN 618 (H) 02/18/2021 0422   IRONPCTSAT 14 02/18/2021 0422      RADIOGRAPHIC STUDIES: I have personally reviewed the radiological images as listed and agreed with the findings in the report. No results found.    ASSESSMENT & PLAN:  1. Malignant neoplasm of left breast in female, estrogen receptor positive, unspecified site of breast ([place])   2. Aromatase inhibitor use    Cancer Staging  Malignant neoplasm of left breast in female, estrogen receptor positive ([place]) Staging form: Breast, AJCC 8th Edition - Pathologic stage from 12/23/2020: Stage IB (pT1c, pN1a, cM0,  G3, ER+, PR+, HER2-) - Signed by Earlie Server, MD on 12/23/2020  # Stage IB left breast cancer, ER/PR+, HER2 - pT1c pN1a M0 Mammaprint came back high risk, 29% recurrence risk in 10 years.  S/p 2 cycles of docetaxel/Cytoxan, status post adjuvant radiation.  Rationale and potential side effects of adjuvant endocrine therapy treatments were reviewed and discussed with patient. Rationale of using aromatase inhibitor -Arimidex  discussed with patient.  Side effects of Arimidex including but not limited to hot flush, joint pain, fatigue, mood swing, osteoporosis discussed with patient. Patient and power of attorney voice understanding and willing to proceed.  Check baseline bone density Recommend calcium 1200 mg and vitamin D supplementation. Recommend exercise and healthy diet   Orders Placed This Encounter  Procedures  DG BONE DENSITY (DXA)    Standing Status:   Future    Standing Expiration Date:   06/26/2022    Order Specific Question:   Reason for Exam (SYMPTOM  OR DIAGNOSIS REQUIRED)    Answer:   breast cancer    Order Specific Question:   Preferred imaging location?    Answer:   [facility]   CBC with Differential/Platelet    Standing Status:   Future    Standing Expiration Date:   06/26/2022   Comprehensive metabolic panel    Standing Status:   Future    Standing Expiration Date:   06/25/2022     All questions were answered. The patient knows to call the clinic with any problems questions or concerns.  cc Kirk Ruths, MD    Return of visit: 3 months.   Earlie Server, MD, PhD 06/25/2021

## 2021-07-14 ENCOUNTER — Encounter: Payer: Self-pay | Admitting: Radiation Oncology

## 2021-07-14 ENCOUNTER — Ambulatory Visit
Admission: RE | Admit: 2021-07-14 | Discharge: 2021-07-14 | Disposition: A | Discharge status: Home / Self Care | Payer: Medicare Other | Source: Ambulatory Visit | Attending: Radiation Oncology | Admitting: Radiation Oncology

## 2021-07-14 VITALS — BP 128/82 | HR 89 | Temp 98.6°F | Resp 12 | Wt 171.6 lb

## 2021-07-14 DIAGNOSIS — Z923 Personal history of irradiation: Secondary | ICD-10-CM | POA: Diagnosis not present

## 2021-07-14 DIAGNOSIS — C50912 Malignant neoplasm of unspecified site of left female breast: Secondary | ICD-10-CM | POA: Insufficient documentation

## 2021-07-14 DIAGNOSIS — Z170 Estrogen receptor positive status [ER+]: Secondary | ICD-10-CM | POA: Insufficient documentation

## 2021-07-14 DIAGNOSIS — Z79811 Long term (current) use of aromatase inhibitors: Secondary | ICD-10-CM | POA: Insufficient documentation

## 2021-08-11 ENCOUNTER — Other Ambulatory Visit: Payer: Self-pay

## 2021-08-20 ENCOUNTER — Ambulatory Visit
Admission: RE | Admit: 2021-08-20 | Discharge: 2021-08-20 | Disposition: A | Discharge status: Home / Self Care | Payer: Medicare Other | Source: Ambulatory Visit | Attending: Oncology | Admitting: Oncology

## 2021-08-20 DIAGNOSIS — Z79811 Long term (current) use of aromatase inhibitors: Secondary | ICD-10-CM | POA: Diagnosis present

## 2021-08-20 DIAGNOSIS — C50912 Malignant neoplasm of unspecified site of left female breast: Secondary | ICD-10-CM | POA: Diagnosis not present

## 2021-08-20 DIAGNOSIS — Z170 Estrogen receptor positive status [ER+]: Secondary | ICD-10-CM | POA: Insufficient documentation

## 2021-09-26 ENCOUNTER — Inpatient Hospital Stay: Payer: Medicare Other | Admitting: Oncology

## 2021-09-26 ENCOUNTER — Inpatient Hospital Stay: Payer: Medicare Other | Attending: Oncology

## 2021-09-28 ENCOUNTER — Other Ambulatory Visit: Payer: Self-pay | Admitting: Oncology

## 2021-10-22 ENCOUNTER — Other Ambulatory Visit: Payer: Self-pay

## 2021-10-22 ENCOUNTER — Other Ambulatory Visit: Payer: Self-pay | Admitting: Oncology

## 2021-10-22 ENCOUNTER — Encounter: Payer: Self-pay | Admitting: Oncology

## 2021-10-22 ENCOUNTER — Inpatient Hospital Stay: Payer: Medicare Other | Attending: Oncology

## 2021-10-22 ENCOUNTER — Inpatient Hospital Stay (HOSPITAL_BASED_OUTPATIENT_CLINIC_OR_DEPARTMENT_OTHER): Payer: Medicare Other | Admitting: Oncology

## 2021-10-22 VITALS — BP 124/62 | HR 75 | Resp 18 | Wt 178.0 lb

## 2021-10-22 DIAGNOSIS — Z90710 Acquired absence of both cervix and uterus: Secondary | ICD-10-CM | POA: Diagnosis not present

## 2021-10-22 DIAGNOSIS — Z170 Estrogen receptor positive status [ER+]: Secondary | ICD-10-CM | POA: Diagnosis not present

## 2021-10-22 DIAGNOSIS — Z853 Personal history of malignant neoplasm of breast: Secondary | ICD-10-CM | POA: Diagnosis not present

## 2021-10-22 DIAGNOSIS — Z79811 Long term (current) use of aromatase inhibitors: Secondary | ICD-10-CM | POA: Diagnosis not present

## 2021-10-22 DIAGNOSIS — E119 Type 2 diabetes mellitus without complications: Secondary | ICD-10-CM | POA: Insufficient documentation

## 2021-10-22 DIAGNOSIS — C50912 Malignant neoplasm of unspecified site of left female breast: Secondary | ICD-10-CM | POA: Insufficient documentation

## 2021-10-22 DIAGNOSIS — I10 Essential (primary) hypertension: Secondary | ICD-10-CM | POA: Insufficient documentation

## 2021-10-22 DIAGNOSIS — Z7189 Other specified counseling: Secondary | ICD-10-CM

## 2021-10-22 DIAGNOSIS — Z803 Family history of malignant neoplasm of breast: Secondary | ICD-10-CM | POA: Diagnosis not present

## 2021-10-22 DIAGNOSIS — C50919 Malignant neoplasm of unspecified site of unspecified female breast: Secondary | ICD-10-CM | POA: Diagnosis not present

## 2021-10-22 DIAGNOSIS — Z5181 Encounter for therapeutic drug level monitoring: Secondary | ICD-10-CM

## 2021-10-22 LAB — CBC WITH DIFFERENTIAL/PLATELET
Abs Immature Granulocytes: 0.02 10*3/uL (ref 0.00–0.07)
Basophils Absolute: 0.1 10*3/uL (ref 0.0–0.1)
Basophils Relative: 1 %
Eosinophils Absolute: 0.2 10*3/uL (ref 0.0–0.5)
Eosinophils Relative: 5 %
HCT: 37 % (ref 36.0–46.0)
Hemoglobin: 12.3 g/dL (ref 12.0–15.0)
Immature Granulocytes: 1 %
Lymphocytes Relative: 20 %
Lymphs Abs: 0.9 10*3/uL (ref 0.7–4.0)
MCH: 30.7 pg (ref 26.0–34.0)
MCHC: 33.2 g/dL (ref 30.0–36.0)
MCV: 92.3 fL (ref 80.0–100.0)
Monocytes Absolute: 0.4 10*3/uL (ref 0.1–1.0)
Monocytes Relative: 10 %
Neutro Abs: 2.8 10*3/uL (ref 1.7–7.7)
Neutrophils Relative %: 63 %
Platelets: 195 10*3/uL (ref 150–400)
RBC: 4.01 MIL/uL (ref 3.87–5.11)
RDW: 12.4 % (ref 11.5–15.5)
WBC: 4.4 10*3/uL (ref 4.0–10.5)
nRBC: 0 % (ref 0.0–0.2)

## 2021-10-22 LAB — COMPREHENSIVE METABOLIC PANEL
ALT: 30 U/L (ref 0–44)
AST: 30 U/L (ref 15–41)
Albumin: 3.9 g/dL (ref 3.5–5.0)
Alkaline Phosphatase: 61 U/L (ref 38–126)
Anion gap: 7 (ref 5–15)
BUN: 13 mg/dL (ref 8–23)
CO2: 30 mmol/L (ref 22–32)
Calcium: 9.2 mg/dL (ref 8.9–10.3)
Chloride: 100 mmol/L (ref 98–111)
Creatinine, Ser: 0.61 mg/dL (ref 0.44–1.00)
GFR, Estimated: >60 mL/min (ref >60)
Glucose, Bld: 166 mg/dL — ABNORMAL HIGH (ref 70–99)
Potassium: 3.6 mmol/L (ref 3.5–5.1)
Sodium: 137 mmol/L (ref 135–145)
Total Bilirubin: 0.3 mg/dL (ref 0.3–1.2)
Total Protein: 7 g/dL (ref 6.5–8.1)

## 2021-10-22 MED ORDER — ANASTROZOLE 1 MG PO TABS: 1.0000 mg | ORAL_TABLET | Freq: Every day | ORAL | 1 refills | Status: DC

## 2021-10-22 MED ORDER — ANASTROZOLE 1 MG PO TABS: 1.0000 mg | ORAL_TABLET | Freq: Every day | ORAL | 3 refills | Status: DC

## 2021-10-22 NOTE — Assessment & Plan Note (Addendum)
#  Stage IB left breast cancer, ER/PR+, HER2 - pT1c pN1a M0 Mammaprint came back high risk, 29% recurrence risk in 10 years.  S/p 2 cycles of docetaxel/Cytoxan, status post adjuvant radiation.  Reviewed results with patient. Continue Arimidex 1 mg daily.  Refill sent. Obtain annual bilateral diagnostic mammogram.

## 2021-10-22 NOTE — Progress Notes (Signed)
Hematology/Oncology Progress note [phone] [phone]      Patient Care Team: Kirk Ruths, MD as PCP - General (Internal Medicine) Theodore Demark, RN (Inactive) as Oncology Nurse Navigator Tsosie Billing, MD as Consulting Physician (Infectious Diseases) Earlie Server, MD as Consulting Physician (Oncology) Noreene Filbert, MD as Consulting Physician (Radiation Oncology) Herbert Pun, MD as Consulting Physician (General Surgery)  ASSESSMENT & PLAN:   Cancer Staging  Malignant neoplasm of left breast in female, estrogen receptor positive [facility]) Staging form: Breast, AJCC 8th Edition - Pathologic stage from 12/23/2020: Stage IB (pT1c, pN1a, cM0, G3, ER+, PR+, HER2-) - Signed by Earlie Server, MD on 12/23/2020   Malignant neoplasm of left breast in female, estrogen receptor positive ([place]) # Stage IB left breast cancer, ER/PR+, HER2 - pT1c pN1a M0 Mammaprint came back high risk, 29% recurrence risk in 10 years.  S/p 2 cycles of docetaxel/Cytoxan, status post adjuvant radiation.  Reviewed results with patient. Continue Arimidex 1 mg daily.  Refill sent.  normal DEXA 08/20/21 recommend continue calcium and vitamin D supplementation.  Orders Placed This Encounter  Procedures   MM DIAG BREAST TOMO BILATERAL    Standing Status:   Future    Standing Expiration Date:   10/23/2022    Order Specific Question:   Reason for Exam (SYMPTOM  OR DIAGNOSIS REQUIRED)    Answer:   history of breast cancer    Order Specific Question:   Preferred imaging location?    Answer:   [facility]   US BREAST LTD UNI LEFT INC AXILLA    Standing Status:   Future    Standing Expiration Date:   10/23/2022    Order Specific Question:   Reason for Exam (SYMPTOM  OR DIAGNOSIS REQUIRED)    Answer:   history of breast cancer    Order Specific Question:   Preferred imaging location?    Answer:   [facility]   US BREAST LTD UNI RIGHT INC AXILLA    Standing Status:    Future    Standing Expiration Date:   10/23/2022    Order Specific Question:   Reason for Exam (SYMPTOM  OR DIAGNOSIS REQUIRED)    Answer:   history of breast cancer    Order Specific Question:   Preferred imaging location?    Answer:   [facility]   Follow-up in 6 months. All questions were answered. The patient knows to call the clinic with any problems, questions or concerns.  Earlie Server, MD, PhD [facility] Hematology Oncology 10/22/2021   CHIEF COMPLAINTS/REASON FOR VISIT:  left breast cancer  HISTORY OF PRESENTING ILLNESS:   Tamara Mejia is a  71 y.o.  female with presents for follow up of left breast cancer.  Oncology History  Malignant neoplasm of left breast in female, estrogen receptor positive ([place])  09/26/2020 Imaging   unilateral left diagnostic mammogram showed there is a suspicious 17 mm mass in the LEFT upper breast which is concerning for malignancy. Recommend ultrasound-guided biopsy for definitive characterization. No suspicious LEFT axillary adenopathy.     10/28/2020 Initial Diagnosis   10/08/2020 left breast US guided biopsy showed invasive mammary carcinoma, no special type. Grade 3, no DCIS, no LVI. ER 51-90%, PR 51-90%, HER2 negative.      12/23/2020 Cancer Staging   Staging form: Breast, AJCC 8th Edition - Pathologic stage from 12/23/2020: Stage IB (pT1c, pN1a, cM0, G3, ER+, PR+, HER2-) - Signed by Earlie Server, MD on 12/23/2020 Stage prefix: Initial diagnosis  Multigene prognostic tests performed: MammaPrint- 29% recurrence risk in 10 years Histologic grading system: 3 grade system    12/28/2020 Genetic Testing   Negative genetic testing. No pathogenic variants identified on the [facility] Common Hereditary Cancers+RNA panel. The report date is 12/28/2020.  The Common Hereditary Cancers Panel + RNA offered by [facility] includes sequencing and/or deletion duplication testing of the following 47 genes: APC, ATM, AXIN2, BARD1, BMPR1A, BRCA1, BRCA2, BRIP1, CDH1,  CDKN2A (p14ARF), CDKN2A (p16INK4a), CKD4, CHEK2, CTNNA1, DICER1, EPCAM (Deletion/duplication testing only), GREM1 (promoter region deletion/duplication testing only), KIT, MEN1, MLH1, MSH2, MSH3, MSH6, MUTYH, NBN, NF1, NHTL1, PALB2, PDGFRA, PMS2, POLD1, POLE, PTEN, RAD50, RAD51C, RAD51D, SDHB, SDHC, SDHD, SMAD4, SMARCA4. STK11, TP53, TSC1, TSC2, and VHL.  The following genes were evaluated for sequence changes only: SDHA and HOXB13 c.251G>A variant only.   01/08/2021 - 01/29/2021 Chemotherapy   TC q21d x2 cycles.  She did not tolerate.    02/14/2021 - 02/28/2021 Hospital Admission    admission due to MSSA bacteremia due to infected medi port.  Medi port was removed on 02/18/2021.  Small abscess seen on MRI of the lumbar spine on 02/22/2021.  Too small to drain  TEE from 02/19/21 NEG She was seen by ID and treated with IV antibiotics. Picc line was placed and patient finished total of 6 weeks of IV antibiotics. PICC line removal.    04/28/2021 - 06/05/2021 Radiation Therapy   status post adjuvant left breast radiation   06/25/2021 -  Anti-estrogen oral therapy   Started on Arimidex 47m daily   08/20/2021 Imaging   DEXA shows normal bone density    .   INTERVAL HISTORY Tamara Mejia a 71y.o. female who has above history reviewed by me today presents for follow up visit for left breast cancer.  Patient was accompanied by facility staff.  Patient denies any new complaints.  She tolerates Arimidex well.  Denies body aches, hot flash  Review of Systems  Constitutional:  Negative for appetite change, chills, fatigue and fever.  HENT:   Negative for hearing loss and voice change.   Eyes:  Negative for eye problems.  Respiratory:  Negative for chest tightness and cough.   Cardiovascular:  Negative for chest pain.  Gastrointestinal:  Negative for abdominal distention, abdominal pain, blood in stool and nausea.  Endocrine: Negative for hot flashes.  Genitourinary:  Negative for difficulty urinating  and frequency.   Musculoskeletal:  Negative for arthralgias.  Skin:  Negative for itching and rash.  Neurological:  Negative for extremity weakness.  Hematological:  Negative for adenopathy.  Psychiatric/Behavioral:  Negative for confusion.     MEDICAL HISTORY:  Past Medical History:  Diagnosis Date   [place]    Cognitive dysfunction    mental retardation (mild to moderate)   Diabetes mellitus, type 2 (HCC)    diet controlled   Family history of breast cancer    High cholesterol    History of 2019 novel coronavirus disease (COVID-19) 11/04/2020   a.) resides in group home; others were sick with SARS-Cov-2. Home test (+).   Hypertension    Meningitis    in childhood   Obesity (BMI 30-39.9)    Overactive bladder    [place]    [place] 2000    SURGICAL HISTORY: Past Surgical History:  Procedure Laterality Date   ABDOMINAL HYSTERECTOMY     as a teenager   BREAST BIOPSY Right    neg   BREAST BIOPSY Left 10/18/2020  u/s breast  biopsy, "venus" clip-INVASIVE MAMMARY CARCINOMA   CHOLECYSTECTOMY     COLONOSCOPY WITH PROPOFOL N/A 08/27/2015   Procedure: COLONOSCOPY WITH PROPOFOL;  Surgeon: Lucilla Lame, MD;  Location: [facility];  Service: Endoscopy;  Laterality: N/A;   PART MASTECTOMY,RADIO FREQUENCY LOCALIZER,AXILLARY SENTINEL NODE BIOPSY Left 11/22/2020   Procedure: PART MASTECTOMY, RADIO FREQUENCY LOCALIZER,AXILLARY SENTINEL NODE BIOPSY;  Surgeon: Herbert Pun, MD;  Location: [facility] ORS;  Service: General;  Laterality: Left;   PORTA CATH REMOVAL N/A 02/18/2021   Procedure: PORTA CATH REMOVAL;  Surgeon: Katha Cabal, MD;  Location: [facility];  Service: Cardiovascular;  Laterality: N/A;   PORTACATH PLACEMENT N/A 12/31/2020   Procedure: INSERTION PORT-A-CATH;  Surgeon: Herbert Pun, MD;  Location: [facility];  Service: General;  Laterality: N/A;   TEE WITHOUT CARDIOVERSION N/A 02/19/2021   Procedure: TRANSESOPHAGEAL  ECHOCARDIOGRAM (TEE);  Surgeon: Corey Skains, MD;  Location: [facility] ORS;  Service: Cardiovascular;  Laterality: N/A;    SOCIAL HISTORY: Social History   Socioeconomic History   Marital status: Single    Spouse name: Not on file   Number of children: Not on file   Years of education: Not on file   Highest education level: Not on file  Occupational History   Not on file  Tobacco Use   Smoking status: Never   Smokeless tobacco: Never  Substance and Sexual Activity   Alcohol use: No   Drug use: No   Sexual activity: Not on file  Other Topics Concern   Not on file  Social History Narrative   Patient works for cutting [employer]   Patient has her adult education certificate   Patient drinks about 2 cups of coffee daily.          Social Determinants of Health   Financial Resource Strain: Not on file  Food Insecurity: Not on file  Transportation Needs: Not on file  Physical Activity: Not on file  Stress: Not on file  Social Connections: Not on file  Intimate Partner Violence: Not on file    FAMILY HISTORY: Family History  Problem Relation Age of Onset   Diabetes Father    Heart disease Father    Heart disease Brother    Breast cancer Cousin        dx 10s    ALLERGIES:  has No Known Allergies.  MEDICATIONS:  Current Outpatient Medications  Medication Sig Dispense Refill   acetaminophen (TYLENOL) 500 MG tablet Take 1,000 mg by mouth every 8 (eight) hours as needed (pain.). (Patient not taking: Reported on 10/22/2021)     amLODipine (NORVASC) 5 MG tablet Take 5 mg by mouth in the morning.     anastrozole (ARIMIDEX) 1 MG tablet Take 1 tablet (1 mg total) by mouth daily. 30 tablet 3   aspirin 81 MG EC tablet Take 81 mg by mouth in the morning. Swallow whole.     calcium carbonate (CALCIUM 600) 600 MG TABS tablet Take 1 tablet (600 mg total) by mouth 2 (two) times daily with a meal. 180 tablet 3   carbamazepine (TEGRETOL) 200 MG tablet TAKE ONE TABLET BY MOUTH THREE TIMES  DAILY. (SEIZURE CONTROL) 90 tablet 11   Cholecalciferol (VITAMIN D) 50 MCG (2000 UT) tablet Take 4,000 Units by mouth in the morning.     citalopram (CELEXA) 10 MG tablet Take 1 tablet (10 mg total) by mouth daily. 30 tablet 0   hydrochlorothiazide (HYDRODIURIL) 25 MG tablet Take 25 mg by mouth daily.  lisinopril (ZESTRIL) 20 MG tablet Take 20 mg by mouth daily.     loratadine (CLARITIN) 10 MG tablet Take 1 tablet (10 mg total) by mouth daily. Take Claritin daily for 4 days, Day1-4 of each chemo cycle. 16 tablet 0   metFORMIN (GLUCOPHAGE) 500 MG tablet Take by mouth 2 (two) times daily with a meal.     metoprolol succinate (TOPROL-XL) 50 MG 24 hr tablet Take 1 tablet (50 mg total) by mouth at bedtime. Take with or immediately following a meal. 30 tablet 0   ondansetron (ZOFRAN-ODT) 4 MG disintegrating tablet Take by mouth. (Patient not taking: Reported on 07/14/2021)     rosuvastatin (CRESTOR) 20 MG tablet Take 20 mg by mouth at bedtime.     No current facility-administered medications for this visit.     PHYSICAL EXAMINATION: ECOG PERFORMANCE STATUS: 1 - Symptomatic but completely ambulatory Vitals:   10/22/21 1345 10/22/21 1347  BP:  124/62  Pulse:  75  Resp: 18 18  SpO2:  97%   Filed Weights   10/22/21 1343  Weight: 178 lb (80.7 kg)    Physical Exam Constitutional:      General: She is not in acute distress.    Appearance: She is obese.  HENT:     Head: Normocephalic and atraumatic.  Eyes:     General: No scleral icterus. Cardiovascular:     Rate and Rhythm: Normal rate and regular rhythm.     Heart sounds: Normal heart sounds.  Pulmonary:     Effort: Pulmonary effort is normal. No respiratory distress.     Breath sounds: No wheezing.  Abdominal:     General: Bowel sounds are normal. There is no distension.     Palpations: Abdomen is soft.  Musculoskeletal:        General: No deformity. Normal range of motion.     Cervical back: Normal range of motion and neck  supple.  Skin:    General: Skin is warm and dry.     Findings: No erythema or rash.  Neurological:     Mental Status: She is alert and oriented to person, place, and time. Mental status is at baseline.     Cranial Nerves: No cranial nerve deficit.     Coordination: Coordination normal.  Psychiatric:        Mood and Affect: Mood normal.     LABORATORY DATA:  I have reviewed the data as listed     Latest Ref Rng & Units 10/22/2021    1:35 PM 05/26/2021    1:14 PM 04/10/2021   10:08 AM  CBC  WBC 4.0 - 10.5 K/uL 4.4  4.4  6.0   Hemoglobin 12.0 - 15.0 g/dL 12.3  12.3  12.1   Hematocrit 36.0 - 46.0 % 37.0  37.5  37.9   Platelets 150 - 400 K/uL 195  201  296       Latest Ref Rng & Units 10/22/2021    1:35 PM 02/28/2021    4:58 AM 02/27/2021    3:34 AM  CMP  Glucose 70 - 99 mg/dL 166  162  171   BUN 8 - 23 mg/dL _0 Creatinine 0.44 - 1.00 mg/dL 0.61  0.59  0.62   Sodium 135 - 145 mmol/L 137  132  134   Potassium 3.5 - 5.1 mmol/L 3.6  3.8  3.9   Chloride 98 - 111 mmol/L 100  99  100   CO2 22 -  32 mmol/L _0 Calcium 8.9 - 10.3 mg/dL 9.2  8.3  8.2   Total Protein 6.5 - 8.1 g/dL 7.0     Total Bilirubin 0.3 - 1.2 mg/dL 0.3     Alkaline Phos 38 - 126 U/L 61     AST 15 - 41 U/L 30     ALT 0 - 44 U/L 30          RADIOGRAPHIC STUDIES: I have personally reviewed the radiological images as listed and agreed with the findings in the report. No results found.

## 2021-10-22 NOTE — Progress Notes (Signed)
Survivorship Care Plan visit completed.  Treatment summary reviewed and given to patient.  ASCO answers booklet reviewed and given to patient.  CARE program and Cancer Transitions discussed with patient along with other resources cancer center offers to patients and caregivers.  Patient verbalized understanding.    

## 2021-10-22 NOTE — Assessment & Plan Note (Signed)
normal DEXA 08/20/21  recommend continue calcium and vitamin D supplementation.

## 2021-11-12 ENCOUNTER — Inpatient Hospital Stay: Admission: RE | Admit: 2021-11-12 | Payer: Medicare Other | Source: Ambulatory Visit

## 2021-12-17 ENCOUNTER — Ambulatory Visit
Admission: RE | Admit: 2021-12-17 | Discharge: 2021-12-17 | Disposition: A | Discharge status: Home / Self Care | Payer: Medicare Other | Source: Ambulatory Visit | Attending: Radiation Oncology | Admitting: Radiation Oncology

## 2021-12-17 ENCOUNTER — Encounter: Payer: Self-pay | Admitting: Radiation Oncology

## 2021-12-17 VITALS — BP 127/82 | Temp 97.6°F | Resp 12 | Wt 180.0 lb

## 2021-12-17 DIAGNOSIS — C50912 Malignant neoplasm of unspecified site of left female breast: Secondary | ICD-10-CM | POA: Insufficient documentation

## 2021-12-17 DIAGNOSIS — Z923 Personal history of irradiation: Secondary | ICD-10-CM | POA: Diagnosis not present

## 2021-12-17 DIAGNOSIS — Z170 Estrogen receptor positive status [ER+]: Secondary | ICD-10-CM | POA: Diagnosis not present

## 2021-12-17 DIAGNOSIS — Z79811 Long term (current) use of aromatase inhibitors: Secondary | ICD-10-CM | POA: Diagnosis not present

## 2021-12-17 NOTE — Progress Notes (Signed)
Radiation Oncology Follow up Note  Name: Tamara Mejia   Date:   12/17/2021 MRN:  [id number] DOB: 1950-10-23    This 71 y.o. female presents to the clinic today for 55-monthfollow-up status post whole breast radiation to her left breast for stage IIa (T1 cN1 M0) grade 3 ER/PR positive invasive mammary carcinoma.  REFERRING PROVIDER: AKirk Ruths MD  HPI: Patient is a 71year old female now out 6 months having completed whole breast radiation to her left breast including her peripheral lymphatics for stage IIa ER/PR positive invasive mammary carcinoma.  Seen today in routine follow-up she is doing well.  She specifically denies breast tenderness cough or bone pain..  She is currently on Arimidex tolerating it well without side effect.  She had mammogram and ultrasound scheduled for next month.  COMPLICATIONS OF TREATMENT: none  FOLLOW UP COMPLIANCE: keeps appointments   PHYSICAL EXAM:  BP 127/82 (BP Location: Right Arm, Patient Position: Sitting, Cuff Size: Normal)   Temp 97.6 F (36.4 C) (Tympanic)   Resp 12   Wt 180 lb (81.6 kg)   BMI 31.89 kg/m  Lungs are clear to A&P cardiac examination essentially unremarkable with regular rate and rhythm. No dominant mass or nodularity is noted in either breast in 2 positions examined. Incision is well-healed. No axillary or supraclavicular adenopathy is appreciated. Cosmetic result is excellent.  Well-developed well-nourished patient in NAD. HEENT reveals PERLA, EOMI, discs not visualized.  Oral cavity is clear. No oral mucosal lesions are identified. Neck is clear without evidence of cervical or supraclavicular adenopathy. Lungs are clear to A&P. Cardiac examination is essentially unremarkable with regular rate and rhythm without murmur rub or thrill. Abdomen is benign with no organomegaly or masses noted. Motor sensory and DTR levels are equal and symmetric in the upper and lower extremities. Cranial nerves II through XII are grossly intact.  Proprioception is intact. No peripheral adenopathy or edema is identified. No motor or sensory levels are noted. Crude visual fields are within normal range.  RADIOLOGY RESULTS: Mammograms will be available reviewed when available  PLAN: Present time patient is doing well 6 months out from whole breast radiation and pleased with her overall progress.  She will have mammograms next month.  I have asked to see her back in 6 months for follow-up.  She is she continues on Arimidex without side effect.  Patient is to call with any concerns.  I would like to take this opportunity to thank you for allowing me to participate in the care of your patient..Noreene Filbert MD

## 2021-12-25 ENCOUNTER — Inpatient Hospital Stay: Admission: RE | Admit: 2021-12-25 | Payer: Self-pay | Source: Ambulatory Visit

## 2022-01-08 ENCOUNTER — Ambulatory Visit (INDEPENDENT_AMBULATORY_CARE_PROVIDER_SITE_OTHER): Payer: Medicare Other | Admitting: Adult Health

## 2022-01-08 ENCOUNTER — Encounter: Payer: Self-pay | Admitting: Adult Health

## 2022-01-08 VITALS — BP 130/85 | HR 74 | Ht 62.0 in | Wt 184.0 lb

## 2022-01-08 DIAGNOSIS — Z5181 Encounter for therapeutic drug level monitoring: Secondary | ICD-10-CM

## 2022-01-08 DIAGNOSIS — G40309 Generalized idiopathic epilepsy and epileptic syndromes, not intractable, without status epilepticus: Secondary | ICD-10-CM

## 2022-01-08 NOTE — Progress Notes (Signed)
PATIENT: Tamara Mejia DOB: [birth year]  REASON FOR VISIT: follow up HISTORY FROM: patient  Chief Complaint  Patient presents with   RM 4    Here with Aniceto Boss, manages the group home the patient lives in. She denies any seizures. None since young age. She takes her seizure medication daily.     HISTORY OF PRESENT ILLNESS: Today 01/08/22:  Tamara Mejia is a 71 y.o. female with a history of mental Retardation and seizures. Returns today for follow-up. Here today with Caregiver. No seizures. Continues on carbamazepine 300 mg TID. No new issues.   01/02/21: Tamara Mejia is a 71 year old female with a history of mental retardation and seizures.  She returns today for follow-up.  She continues to live at a group home.  She is here today with a caregiver.  Denies any seizure events.  Continues on carbamazepine 200 mg 3 times a day.  Recently diagnosed with breast cancer and will start chemotherapy next week.  12/28/19: Tamara Mejia is a 71 year old female with a history of mental retardation and seizures.  She returns today for follow-up.  Remains on carbamazepine 200 mg 3 times daily.  Continues to reside in a group home.  She is here today with a caregiver.  Denies any seizure events.  No change in her gait or balance.  No change in mood or behavior.  She returns today for an evaluation.  12/12/18: Tamara Mejia is a 71 year old female with a history of mental retardation and seizures.  She returns today for follow-up.  Overall she is doing well.  She denies any seizure events.  She continues on carbamazepine 200 mg 3 times a day.  She continues to live at a group home.  She is able to complete all ADLs independently.  She does not cook there.  She denies any changes with her gait or balance.  She returns today for an evaluation.  HISTORY 12/06/2017:  Tamara Mejia 71 year old female returns for follow-up with history of generalized seizure disorder.  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  Last seizure was in 2000.  She currently resides in a group home.  She goes to a day program.  She is independent with activities of daily living.  She is currently on Tegretol 203 times daily without side effects.  No falls no balance issues.  CBC and CMP reviewed in care everywhere.  She returns for reevaluation  REVIEW OF SYSTEMS: Out of a complete 14 system review of symptoms, the patient complains only of the following symptoms, and all other reviewed systems are negative.  See HPI  ALLERGIES: No Known Allergies  HOME MEDICATIONS: Outpatient Medications Prior to Visit  Medication Sig Dispense Refill   amLODipine (NORVASC) 5 MG tablet Take 5 mg by mouth in the morning.     anastrozole (ARIMIDEX) 1 MG tablet Take 1 tablet (1 mg total) by mouth daily. 90 tablet 1   aspirin 81 MG EC tablet Take 81 mg by mouth in the morning. Swallow whole.     calcium carbonate (CALCIUM 600) 600 MG TABS tablet Take 1 tablet (600 mg total) by mouth 2 (two) times daily with a meal. 180 tablet 3   carbamazepine (TEGRETOL) 200 MG tablet TAKE ONE TABLET BY MOUTH THREE TIMES DAILY. (SEIZURE CONTROL) 90 tablet 11   Cholecalciferol (VITAMIN D) 50 MCG (2000 UT) tablet Take 4,000 Units by mouth in the morning.     citalopram (CELEXA) 10 MG tablet  Take 1 tablet by mouth daily.     hydrochlorothiazide (HYDRODIURIL) 25 MG tablet Take 25 mg by mouth daily.     lisinopril (ZESTRIL) 20 MG tablet Take 20 mg by mouth daily.     loratadine (CLARITIN) 10 MG tablet Take 1 tablet (10 mg total) by mouth daily. Take Claritin daily for 4 days, Day1-4 of each chemo cycle. 16 tablet 0   metFORMIN (GLUCOPHAGE) 500 MG tablet Take by mouth 2 (two) times daily with a meal.     metoprolol succinate (TOPROL-XL) 50 MG 24 hr tablet Take 1 tablet (50 mg total) by mouth at bedtime. Take with or immediately following a meal. 30 tablet 0   metoprolol succinate (TOPROL-XL) 50 MG 24 hr tablet TAKE 1 TABLET BY MOUTH  EVERY NIGHT AT BEDTIME *DO NOT CRUSH OR CHEW*     rosuvastatin (CRESTOR) 20 MG tablet Take 20 mg by mouth at bedtime.     No facility-administered medications prior to visit.    PAST MEDICAL HISTORY: Past Medical History:  Diagnosis Date   Breast cancer ([place])    Cognitive dysfunction    mental retardation (mild to moderate)   Diabetes mellitus, type 2 (HCC)    diet controlled   Family history of breast cancer    High cholesterol    History of 2019 novel coronavirus disease (COVID-19) 11/04/2020   a.) resides in group home; others were sick with SARS-Cov-2. Home test (+).   Hypertension    Meningitis    in childhood   Obesity (BMI 30-39.9)    Overactive bladder    Seizure ([place])    last seizure 2000    PAST SURGICAL HISTORY: Past Surgical History:  Procedure Laterality Date   ABDOMINAL HYSTERECTOMY     as a teenager   BREAST BIOPSY Right    neg   BREAST BIOPSY Left 10/18/2020   u/s breast  biopsy, "venus" clip-INVASIVE MAMMARY CARCINOMA   CHOLECYSTECTOMY     COLONOSCOPY WITH PROPOFOL N/A 08/27/2015   Procedure: COLONOSCOPY WITH PROPOFOL;  Surgeon: Lucilla Lame, MD;  Location: [facility];  Service: Endoscopy;  Laterality: N/A;   PART MASTECTOMY,RADIO FREQUENCY LOCALIZER,AXILLARY SENTINEL NODE BIOPSY Left 11/22/2020   Procedure: PART MASTECTOMY, RADIO FREQUENCY LOCALIZER,AXILLARY SENTINEL NODE BIOPSY;  Surgeon: Herbert Pun, MD;  Location: [facility];  Service: General;  Laterality: Left;   PORTA CATH REMOVAL N/A 02/18/2021   Procedure: PORTA CATH REMOVAL;  Surgeon: Katha Cabal, MD;  Location: [place] [facility];  Service: Cardiovascular;  Laterality: N/A;   PORTACATH PLACEMENT N/A 12/31/2020   Procedure: INSERTION PORT-A-CATH;  Surgeon: Herbert Pun, MD;  Location: [facility];  Service: General;  Laterality: N/A;   TEE WITHOUT CARDIOVERSION N/A 02/19/2021   Procedure: TRANSESOPHAGEAL ECHOCARDIOGRAM (TEE);  Surgeon: Corey Skains, MD;  Location:  [facility] OR[facility];  Service: Cardiovascular;  Laterality: N/A;    FAMILY HISTORY: Family History  Problem Relation Age of Onset   Diabetes Father    Heart disease Father    Heart disease Brother    Breast cancer Cousin        dx 62s    SOCIAL HISTORY: Social History   Socioeconomic History   Marital status: Single    Spouse name: Not on file   Number of children: Not on file   Years of education: Not on file   Highest education level: Not on file  Occupational History   Not on file  Tobacco Use   Smoking status: Never   Smokeless tobacco: Never  Substance and Sexual Activity   Alcohol use: No   Drug use: No   Sexual activity: Not on file  Other Topics Concern   Not on file  Social History Narrative   Patient works for cutting board   Patient has her adult education certificate   Patient drinks about 2 cups of coffee daily.          Social Determinants of Health   Financial Resource Strain: Not on file  Food Insecurity: Not on file  Transportation Needs: Not on file  Physical Activity: Not on file  Stress: Not on file  Social Connections: Not on file  Intimate Partner Violence: Not on file      PHYSICAL EXAM  Vitals:   01/08/22 1014  BP: 130/85  Pulse: 74  Weight: 184 lb (83.5 kg)  Height: '5\' 2"'$  (1.575 m)    Body mass index is 33.65 kg/m.  Generalized: Well developed, in no acute distress   Neurological examination  Mentation: Alert oriented to time, place, history taking. Follows all commands speech and language fluent Cranial nerve II-XII: Pupils were equal round reactive to light. Extraocular movements were full, visual field were full on confrontational test.  Head turning and shoulder shrug  were normal and symmetric. Motor: The motor testing reveals 5 over 5 strength of all 4 extremities. Good symmetric motor tone is noted throughout.  Sensory: Sensory testing is intact to soft touch on all 4 extremities. No evidence of extinction is noted.   Coordination: Cerebellar testing reveals good finger-nose-finger and heel-to-shin bilaterally.  Gait and station: Gait is normal.    DIAGNOSTIC DATA (LABS, IMAGING, TESTING) - I reviewed patient records, labs, notes, testing and imaging myself where available.  Lab Results  Component Value Date   WBC 4.4 10/22/2021   HGB 12.3 10/22/2021   HCT 37.0 10/22/2021   MCV 92.3 10/22/2021   PLT 195 10/22/2021      Component Value Date/Time   NA 137 10/22/2021 1335   NA 142 01/02/2021 1013   NA 139 12/22/2011 2245   K 3.6 10/22/2021 1335   K 3.8 12/22/2011 2245   CL 100 10/22/2021 1335   CL 104 12/22/2011 2245   CO2 30 10/22/2021 1335   CO2 29 12/22/2011 2245   GLUCOSE 166 (H) 10/22/2021 1335   GLUCOSE 100 (H) 12/22/2011 2245   BUN 13 10/22/2021 1335   BUN 10 01/02/2021 1013   BUN 18 12/22/2011 2245   CREATININE 0.61 10/22/2021 1335   CREATININE 0.81 12/22/2011 2245   CALCIUM 9.2 10/22/2021 1335   CALCIUM 9.1 12/22/2011 2245   PROT 7.0 10/22/2021 1335   PROT 6.8 01/02/2021 1013   PROT 7.6 12/22/2011 2245   ALBUMIN 3.9 10/22/2021 1335   ALBUMIN 4.5 01/02/2021 1013   ALBUMIN 3.8 12/22/2011 2245   AST 30 10/22/2021 1335   AST 34 12/22/2011 2245   ALT 30 10/22/2021 1335   ALT 33 12/22/2011 2245   ALKPHOS 61 10/22/2021 1335   ALKPHOS 83 12/22/2011 2245   BILITOT 0.3 10/22/2021 1335   BILITOT <0.2 01/02/2021 1013   BILITOT 0.2 12/22/2011 2245   GFRNONAA >60 10/22/2021 1335   GFRNONAA >60 12/22/2011 2245   GFRAA 95 12/28/2019 1000   GFRAA >60 12/22/2011 2245      ASSESSMENT AND PLAN 71 y.o. year old female  has a past medical history of Breast cancer ([place]), Cognitive dysfunction, Diabetes mellitus, type 2 ([place]), Family history of breast cancer, High cholesterol, History of 2019 novel coronavirus  disease (COVID-19) (11/04/2020), Hypertension, Meningitis, Obesity (BMI 30-39.9), Overactive bladder, and Seizure ([place]). here with:  1.  Seizures  -Continue carbamazepine 200  mg 3 times a day -Blood work today-carbamazepine level -Advised if she has any seizure event she should let us know -Follow-up in 1 year or sooner if needed     Ward Givens, MSN, NP-C 01/08/2022, 10:06 AM [facility] [address] [place], [place] [address] [phone]

## 2022-01-09 ENCOUNTER — Ambulatory Visit
Admission: RE | Admit: 2022-01-09 | Discharge: 2022-01-09 | Disposition: A | Discharge status: Home / Self Care | Payer: Medicare Other | Source: Ambulatory Visit | Attending: General Surgery | Admitting: General Surgery

## 2022-01-09 ENCOUNTER — Ambulatory Visit
Admission: RE | Admit: 2022-01-09 | Discharge: 2022-01-09 | Disposition: A | Discharge status: Home / Self Care | Payer: Medicare Other | Source: Ambulatory Visit | Attending: Oncology | Admitting: Oncology

## 2022-01-09 ENCOUNTER — Other Ambulatory Visit: Payer: Self-pay | Admitting: General Surgery

## 2022-01-09 DIAGNOSIS — Z853 Personal history of malignant neoplasm of breast: Secondary | ICD-10-CM | POA: Insufficient documentation

## 2022-01-09 DIAGNOSIS — N6489 Other specified disorders of breast: Secondary | ICD-10-CM | POA: Insufficient documentation

## 2022-01-09 DIAGNOSIS — Z1231 Encounter for screening mammogram for malignant neoplasm of breast: Secondary | ICD-10-CM | POA: Diagnosis present

## 2022-01-09 LAB — CARBAMAZEPINE LEVEL, TOTAL: Carbamazepine (Tegretol), S: 8.6 ug/mL (ref 4.0–12.0)

## 2022-01-09 MED ORDER — LIDOCAINE HCL (PF) 1 % IJ SOLN
5.0000 mL | Freq: Once | INTRAMUSCULAR | Status: AC
  Administered 2022-01-09: 5 mL

## 2022-04-23 ENCOUNTER — Inpatient Hospital Stay: Payer: 59

## 2022-04-23 ENCOUNTER — Inpatient Hospital Stay: Payer: 59 | Admitting: Oncology

## 2022-04-23 ENCOUNTER — Other Ambulatory Visit: Payer: Self-pay

## 2022-04-23 DIAGNOSIS — C50912 Malignant neoplasm of unspecified site of left female breast: Secondary | ICD-10-CM

## 2022-04-24 ENCOUNTER — Other Ambulatory Visit: Payer: Self-pay

## 2022-04-24 MED ORDER — ANASTROZOLE 1 MG PO TABS: 1.0000 mg | ORAL_TABLET | Freq: Every day | ORAL | 1 refills | Status: DC

## 2022-05-21 ENCOUNTER — Inpatient Hospital Stay (HOSPITAL_BASED_OUTPATIENT_CLINIC_OR_DEPARTMENT_OTHER): Payer: 59 | Admitting: Oncology

## 2022-05-21 ENCOUNTER — Encounter: Payer: Self-pay | Admitting: Oncology

## 2022-05-21 ENCOUNTER — Inpatient Hospital Stay: Payer: 59 | Attending: Oncology

## 2022-05-21 VITALS — BP 111/61 | HR 71 | Temp 96.9°F | Resp 18 | Wt 184.8 lb

## 2022-05-21 DIAGNOSIS — Z803 Family history of malignant neoplasm of breast: Secondary | ICD-10-CM | POA: Insufficient documentation

## 2022-05-21 DIAGNOSIS — Z79811 Long term (current) use of aromatase inhibitors: Secondary | ICD-10-CM

## 2022-05-21 DIAGNOSIS — Z170 Estrogen receptor positive status [ER+]: Secondary | ICD-10-CM | POA: Insufficient documentation

## 2022-05-21 DIAGNOSIS — C50912 Malignant neoplasm of unspecified site of left female breast: Secondary | ICD-10-CM

## 2022-05-21 DIAGNOSIS — Z90710 Acquired absence of both cervix and uterus: Secondary | ICD-10-CM | POA: Insufficient documentation

## 2022-05-21 LAB — CBC WITH DIFFERENTIAL (CANCER CENTER ONLY)
Abs Immature Granulocytes: 0.02 10*3/uL (ref 0.00–0.07)
Basophils Absolute: 0.1 10*3/uL (ref 0.0–0.1)
Basophils Relative: 1 %
Eosinophils Absolute: 0.2 10*3/uL (ref 0.0–0.5)
Eosinophils Relative: 4 %
HCT: 36.6 % (ref 36.0–46.0)
Hemoglobin: 12.2 g/dL (ref 12.0–15.0)
Immature Granulocytes: 0 %
Lymphocytes Relative: 20 %
Lymphs Abs: 0.9 10*3/uL (ref 0.7–4.0)
MCH: 31 pg (ref 26.0–34.0)
MCHC: 33.3 g/dL (ref 30.0–36.0)
MCV: 92.9 fL (ref 80.0–100.0)
Monocytes Absolute: 0.5 10*3/uL (ref 0.1–1.0)
Monocytes Relative: 11 %
Neutro Abs: 2.8 10*3/uL (ref 1.7–7.7)
Neutrophils Relative %: 64 %
Platelet Count: 198 10*3/uL (ref 150–400)
RBC: 3.94 MIL/uL (ref 3.87–5.11)
RDW: 12.8 % (ref 11.5–15.5)
WBC Count: 4.5 10*3/uL (ref 4.0–10.5)
nRBC: 0 % (ref 0.0–0.2)

## 2022-05-21 LAB — CMP (CANCER CENTER ONLY)
ALT: 27 U/L (ref 0–44)
AST: 29 U/L (ref 15–41)
Albumin: 4.1 g/dL (ref 3.5–5.0)
Alkaline Phosphatase: 65 U/L (ref 38–126)
Anion gap: 11 (ref 5–15)
BUN: 22 mg/dL (ref 8–23)
CO2: 28 mmol/L (ref 22–32)
Calcium: 9.3 mg/dL (ref 8.9–10.3)
Chloride: 100 mmol/L (ref 98–111)
Creatinine: 0.79 mg/dL (ref 0.44–1.00)
GFR, Estimated: >60 mL/min (ref >60)
Glucose, Bld: 83 mg/dL (ref 70–99)
Potassium: 3.7 mmol/L (ref 3.5–5.1)
Sodium: 139 mmol/L (ref 135–145)
Total Bilirubin: 0.3 mg/dL (ref 0.3–1.2)
Total Protein: 7.3 g/dL (ref 6.5–8.1)

## 2022-05-21 MED ORDER — ANASTROZOLE 1 MG PO TABS: 1.0000 mg | ORAL_TABLET | Freq: Every day | ORAL | 1 refills | Status: DC

## 2022-05-21 NOTE — Progress Notes (Signed)
Hematology/Oncology Progress note [phone] [phone]      CHIEF COMPLAINTS/REASON FOR VISIT:  left breast cancer   ASSESSMENT & PLAN:   Cancer Staging  Malignant neoplasm of left breast in female, estrogen receptor positive (HCC) Staging form: Breast, AJCC 8th Edition - Pathologic stage from 12/23/2020: Stage IB (pT1c, pN1a, cM0, G3, ER+, PR+, HER2-) - Signed by Rickard Patience, MD on 12/23/2020   Malignant neoplasm of left breast in female, estrogen receptor positive (HCC) # Stage IB left breast cancer, ER/PR+, HER2 - pT1c pN1a M0 Mammaprint came back high risk, 29% recurrence risk in 10 years.  S/p 2 cycles of docetaxel/Cytoxan, status post adjuvant radiation.  Reviewed results with patient, and facility staff.. Continue Arimidex 1 mg daily.  Refill sent. Continue annual bilateral diagnostic mammogram-next due in December 2024.    Aromatase inhibitor use normal DEXA 08/20/21  recommend continue calcium and vitamin D supplementation.  Orders Placed This Encounter  Procedures   CMP (Cancer Center only)    Standing Status:   Future    Standing Expiration Date:   05/21/2023   CBC with Differential (Cancer Center Only)    Standing Status:   Future    Standing Expiration Date:   05/21/2023   Follow-up in 6 months. All questions were answered. The patient knows to call the clinic with any problems, questions or concerns.  Rickard Patience, MD, PhD [facility] Hematology Oncology 05/21/2022    HISTORY OF PRESENTING ILLNESS:   Tamara Mejia is a  72 y.o.  female with presents for follow up of left breast cancer.  Oncology History  Malignant neoplasm of left breast in female, estrogen receptor positive (HCC)  09/26/2020 Imaging   unilateral left diagnostic mammogram showed there is a suspicious 17 mm mass in the LEFT upper breast which is concerning for malignancy. Recommend ultrasound-guided biopsy for definitive characterization. No suspicious LEFT axillary  adenopathy.     10/28/2020 Initial Diagnosis   10/08/2020 left breast US guided biopsy showed invasive mammary carcinoma, no special type. Grade 3, no DCIS, no LVI. ER 51-90%, PR 51-90%, HER2 negative.      12/23/2020 Cancer Staging   Staging form: Breast, AJCC 8th Edition - Pathologic stage from 12/23/2020: Stage IB (pT1c, pN1a, cM0, G3, ER+, PR+, HER2-) - Signed by Rickard Patience, MD on 12/23/2020 Stage prefix: Initial diagnosis Multigene prognostic tests performed: MammaPrint- 29% recurrence risk in 10 years Histologic grading system: 3 grade system    12/28/2020 Genetic Testing   Negative genetic testing. No pathogenic variants identified on the [facility] Common Hereditary Cancers+RNA panel. The report date is 12/28/2020.  The Common Hereditary Cancers Panel + RNA offered by [facility] includes sequencing and/or deletion duplication testing of the following 47 genes: APC, ATM, AXIN2, BARD1, BMPR1A, BRCA1, BRCA2, BRIP1, CDH1, CDKN2A (p14ARF), CDKN2A (p16INK4a), CKD4, CHEK2, CTNNA1, DICER1, EPCAM (Deletion/duplication testing only), GREM1 (promoter region deletion/duplication testing only), KIT, MEN1, MLH1, MSH2, MSH3, MSH6, MUTYH, NBN, NF1, NHTL1, PALB2, PDGFRA, PMS2, POLD1, POLE, PTEN, RAD50, RAD51C, RAD51D, SDHB, SDHC, SDHD, SMAD4, SMARCA4. STK11, TP53, TSC1, TSC2, and VHL.  The following genes were evaluated for sequence changes only: SDHA and HOXB13 c.251G>A variant only.   01/08/2021 - 01/29/2021 Chemotherapy   TC q21d x2 cycles.  She did not tolerate.    02/14/2021 - 02/28/2021 Hospital Admission    admission due to MSSA bacteremia due to infected medi port.  Medi port was removed on 02/18/2021.  Small abscess seen on MRI of the lumbar spine on 02/22/2021.  Too small to drain  TEE from 02/19/21 NEG She was seen by ID and treated with IV antibiotics. Picc line was placed and patient finished total of 6 weeks of IV antibiotics. PICC line removal.    04/28/2021 - 06/05/2021 Radiation Therapy   status  post adjuvant left breast radiation   06/25/2021 -  Anti-estrogen oral therapy   Started on Arimidex 1mg  daily   08/20/2021 Imaging   DEXA shows normal bone density    .   INTERVAL HISTORY Tamara Mejia is a 72 y.o. female who has above history reviewed by me today presents for follow up visit for left breast cancer.  Patient was accompanied by facility staff.  Patient denies any new complaints.  She tolerates Arimidex well.  Denies body aches, hot flash  Review of Systems  Constitutional:  Negative for appetite change, chills, fatigue and fever.  HENT:   Negative for hearing loss and voice change.   Eyes:  Negative for eye problems.  Respiratory:  Negative for chest tightness and cough.   Cardiovascular:  Negative for chest pain.  Gastrointestinal:  Negative for abdominal distention, abdominal pain, blood in stool and nausea.  Endocrine: Negative for hot flashes.  Genitourinary:  Negative for difficulty urinating and frequency.   Musculoskeletal:  Negative for arthralgias.  Skin:  Negative for itching and rash.  Neurological:  Negative for extremity weakness.  Hematological:  Negative for adenopathy.  Psychiatric/Behavioral:  Negative for confusion.     MEDICAL HISTORY:  Past Medical History:  Diagnosis Date   Breast cancer (HCC)    chemo and radiation   Cognitive dysfunction    mental retardation (mild to moderate)   Diabetes mellitus, type 2 (HCC)    diet controlled   Family history of breast cancer    High cholesterol    History of 2019 novel coronavirus disease (COVID-19) 11/04/2020   a.) resides in group home; others were sick with SARS-Cov-2. Home test (+).   Hypertension    Meningitis    in childhood   Obesity (BMI 30-39.9)    Overactive bladder    Seizure (HCC)    last seizure 2000    SURGICAL HISTORY: Past Surgical History:  Procedure Laterality Date   ABDOMINAL HYSTERECTOMY     as a teenager   BREAST BIOPSY Right    neg   BREAST BIOPSY Left  10/18/2020   u/s breast  biopsy, "venus" clip-INVASIVE MAMMARY CARCINOMA   BUNIONECTOMY     CHOLECYSTECTOMY     COLONOSCOPY WITH PROPOFOL N/A 08/27/2015   Procedure: COLONOSCOPY WITH PROPOFOL;  Surgeon: Midge Minium, MD;  Location: [facility] END[facility];  Service: Endoscopy;  Laterality: N/A;   PART MASTECTOMY,RADIO FREQUENCY LOCALIZER,AXILLARY SENTINEL NODE BIOPSY Left 11/22/2020   Procedure: PART MASTECTOMY, RADIO FREQUENCY LOCALIZER,AXILLARY SENTINEL NODE BIOPSY;  Surgeon: Carolan Shiver, MD;  Location: [facility];  Service: General;  Laterality: Left;   PORTA CATH REMOVAL N/A 02/18/2021   Procedure: PORTA CATH REMOVAL;  Surgeon: Renford Dills, MD;  Location: [facility];  Service: Cardiovascular;  Laterality: N/A;   PORTACATH PLACEMENT N/A 12/31/2020   Procedure: INSERTION PORT-A-CATH;  Surgeon: Carolan Shiver, MD;  Location: [facility];  Service: General;  Laterality: N/A;   TEE WITHOUT CARDIOVERSION N/A 02/19/2021   Procedure: TRANSESOPHAGEAL ECHOCARDIOGRAM (TEE);  Surgeon: Lamar Blinks, MD;  Location: [facility];  Service: Cardiovascular;  Laterality: N/A;    SOCIAL HISTORY: Social History   Socioeconomic History   Marital status: Single    Spouse name:  Not on file   Number of children: Not on file   Years of education: Not on file   Highest education level: Not on file  Occupational History   Not on file  Tobacco Use   Smoking status: Never   Smokeless tobacco: Never  Substance and Sexual Activity   Alcohol use: No   Drug use: No   Sexual activity: Not on file  Other Topics Concern   Not on file  Social History Narrative   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    Social Determinants of Health   Financial Resource Strain: Not on  file  Food Insecurity: Not on file  Transportation Needs: Not on file  Physical Activity: Not on file  Stress: Not on file  Social Connections: Not on file  Intimate Partner Violence: Not on file    FAMILY HISTORY: Family History  Problem Relation Age of Onset   Diabetes Father    Heart disease Father    Heart disease Brother    Breast cancer Cousin        dx 92s    ALLERGIES:  has No Known Allergies.  MEDICATIONS:  Current Outpatient Medications  Medication Sig Dispense Refill   aspirin 81 MG EC tablet Take 81 mg by mouth in the morning. Swallow whole.     calcium carbonate (CALCIUM 600) 600 MG TABS tablet Take 1 tablet (600 mg total) by mouth 2 (two) times daily with a meal. 180 tablet 3   Cholecalciferol (VITAMIN D) 50 MCG (2000 UT) tablet Take 4,000 Units by mouth in the morning.     citalopram (CELEXA) 10 MG tablet Take 1 tablet by mouth daily.     hydrochlorothiazide (HYDRODIURIL) 25 MG tablet Take 25 mg by mouth daily.     lisinopril (ZESTRIL) 20 MG tablet Take 20 mg by mouth daily.     metoprolol succinate (TOPROL-XL) 50 MG 24 hr tablet Take 1 tablet (50 mg total) by mouth at bedtime. Take with or immediately following a meal. 30 tablet 0   rosuvastatin (CRESTOR) 20 MG tablet Take 20 mg by mouth at bedtime.     amLODipine (NORVASC) 5 MG tablet Take 5 mg by mouth in the morning. (Patient not taking: Reported on 05/21/2022)     anastrozole (ARIMIDEX) 1 MG tablet Take 1 tablet (1 mg total) by mouth daily. 90 tablet 1   carbamazepine (TEGRETOL) 200 MG tablet TAKE ONE TABLET BY MOUTH THREE TIMES DAILY. (SEIZURE CONTROL) (Patient not taking: Reported on 05/21/2022) 90 tablet 11   loratadine (CLARITIN) 10 MG tablet Take 1 tablet (10 mg total) by mouth daily. Take Claritin daily for 4 days, Day1-4 of each chemo cycle. (Patient not taking: Reported on 01/08/2022) 16 tablet 0   metoprolol succinate (TOPROL-XL) 50 MG 24 hr tablet TAKE 1 TABLET BY MOUTH EVERY NIGHT AT BEDTIME *DO NOT  CRUSH OR CHEW* (Patient not taking: Reported on 05/21/2022)     No current facility-administered medications for this visit.     PHYSICAL EXAMINATION: ECOG PERFORMANCE STATUS: 1 - Symptomatic but completely ambulatory Vitals:   05/21/22 1050  BP: 111/61  Pulse: 71  Resp: 18  Temp: (!) 96.9 F (36.1 C)  SpO2: 97%   Filed Weights  05/21/22 1050  Weight: 184 lb 12.8 oz (83.8 kg)    Physical Exam Constitutional:      General: She is not in acute distress.    Appearance: She is obese.  HENT:     Head: Normocephalic and atraumatic.  Eyes:     General: No scleral icterus. Cardiovascular:     Rate and Rhythm: Normal rate and regular rhythm.  Pulmonary:     Effort: Pulmonary effort is normal. No respiratory distress.     Breath sounds: No wheezing.  Abdominal:     General: Bowel sounds are normal. There is no distension.     Palpations: Abdomen is soft.  Musculoskeletal:        General: No deformity. Normal range of motion.     Cervical back: Normal range of motion and neck supple.  Skin:    General: Skin is warm and dry.     Findings: No erythema or rash.  Neurological:     Mental Status: She is alert and oriented to person, place, and time. Mental status is at baseline.     Cranial Nerves: No cranial nerve deficit.     Coordination: Coordination normal.  Psychiatric:        Mood and Affect: Mood normal.    Breast exam was performed in seated and lying down position. Patient is status post left lumpectomy with a well-healed surgical scar.   No palpable breast masses bilaterally.  No palpable axillary adenopathy bilaterally.    LABORATORY DATA:  I have reviewed the data as listed     Latest Ref Rng & Units 05/21/2022   10:14 AM 10/22/2021    1:35 PM 05/26/2021    1:14 PM  CBC  WBC 4.0 - 10.5 K/uL 4.5  4.4  4.4   Hemoglobin 12.0 - 15.0 g/dL 08.6  57.8  46.9   Hematocrit 36.0 - 46.0 % 36.6  37.0  37.5   Platelets 150 - 400 K/uL 198  195  201       Latest Ref Rng  & Units 05/21/2022   10:14 AM 10/22/2021    1:35 PM 02/28/2021    4:58 AM  CMP  Glucose 70 - 99 mg/dL 83  629  528   BUN 8 - 23 mg/dL 22  13  12    Creatinine 0.44 - 1.00 mg/dL 4.13  2.44  0.10   Sodium 135 - 145 mmol/L 139  137  132   Potassium 3.5 - 5.1 mmol/L 3.7  3.6  3.8   Chloride 98 - 111 mmol/L 100  100  99   CO2 22 - 32 mmol/L 28  30  27    Calcium 8.9 - 10.3 mg/dL 9.3  9.2  8.3   Total Protein 6.5 - 8.1 g/dL 7.3  7.0    Total Bilirubin 0.3 - 1.2 mg/dL 0.3  0.3    Alkaline Phos 38 - 126 U/L 65  61    AST 15 - 41 U/L 29  30    ALT 0 - 44 U/L 27  30         RADIOGRAPHIC STUDIES: I have personally reviewed the radiological images as listed and agreed with the findings in the report. No results found.

## 2022-05-21 NOTE — Assessment & Plan Note (Signed)
normal DEXA 08/20/21  recommend continue calcium and vitamin D supplementation. 

## 2022-05-21 NOTE — Assessment & Plan Note (Addendum)
#   Stage IB left breast cancer, ER/PR+, HER2 - pT1c pN1a M0 Mammaprint came back high risk, 29% recurrence risk in 10 years.  S/p 2 cycles of docetaxel/Cytoxan, status post adjuvant radiation.  Reviewed results with patient, and facility staff.. Continue Arimidex 1 mg daily.  Refill sent. Continue annual bilateral diagnostic mammogram-next due in December 2024.

## 2022-06-16 ENCOUNTER — Other Ambulatory Visit: Payer: Self-pay | Admitting: Oncology

## 2022-06-17 ENCOUNTER — Ambulatory Visit: Payer: 59 | Admitting: Radiation Oncology

## 2022-07-02 ENCOUNTER — Ambulatory Visit
Admission: RE | Admit: 2022-07-02 | Discharge: 2022-07-02 | Disposition: A | Discharge status: Home / Self Care | Payer: 59 | Source: Ambulatory Visit | Attending: Radiation Oncology | Admitting: Radiation Oncology

## 2022-07-02 ENCOUNTER — Encounter: Payer: Self-pay | Admitting: Radiation Oncology

## 2022-07-02 VITALS — BP 119/60 | HR 71 | Temp 97.5°F | Resp 16 | Ht 62.0 in | Wt 182.0 lb

## 2022-07-02 DIAGNOSIS — Z170 Estrogen receptor positive status [ER+]: Secondary | ICD-10-CM | POA: Insufficient documentation

## 2022-07-02 DIAGNOSIS — Z923 Personal history of irradiation: Secondary | ICD-10-CM | POA: Insufficient documentation

## 2022-07-02 DIAGNOSIS — C50912 Malignant neoplasm of unspecified site of left female breast: Secondary | ICD-10-CM | POA: Insufficient documentation

## 2022-07-02 DIAGNOSIS — Z79811 Long term (current) use of aromatase inhibitors: Secondary | ICD-10-CM | POA: Diagnosis not present

## 2022-07-02 NOTE — Progress Notes (Signed)
Radiation Oncology Follow up Note  Name: Tamara Mejia   Date:   07/02/2022 MRN:  [id number] DOB: 01-Jun-1950    This 72 y.o. 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  REFERRING PROVIDER: Lauro Regulus, MD  HPI: Patient is a 72 year old female now out 1 year having completed whole breast radiation to her left breast for stage IIa ER/PR positive invasive mammary carcinoma.  Seen today in routine follow-up she is doing well.  She specifically denies any breast tenderness cough or bone pain..  She had mammograms back in December which I have reviewed were BI-RADS 2 benign.  She is currently on Arimidex tolerating it well without side effect.  COMPLICATIONS OF TREATMENT: none  FOLLOW UP COMPLIANCE: keeps appointments   PHYSICAL EXAM:  BP 119/60   Pulse 71   Temp (!) 97.5 F (36.4 C)   Resp 16   Ht 5\' 2"  (1.575 m)   Wt 182 lb (82.6 kg)   BMI 33.29 kg/m  Lungs are clear to A&P cardiac examination essentially unremarkable with regular rate and rhythm. No dominant mass or nodularity is noted in either breast in 2 positions examined. Incision is well-healed. No axillary or supraclavicular adenopathy is appreciated. Cosmetic result is excellent.  Well-developed well-nourished patient in NAD. HEENT reveals PERLA, EOMI, discs not visualized.  Oral cavity is clear. No oral mucosal lesions are identified. Neck is clear without evidence of cervical or supraclavicular adenopathy. Lungs are clear to A&P. Cardiac examination is essentially unremarkable with regular rate and rhythm without murmur rub or thrill. Abdomen is benign with no organomegaly or masses noted. Motor sensory and DTR levels are equal and symmetric in the upper and lower extremities. Cranial nerves II through XII are grossly intact. Proprioception is intact. No peripheral adenopathy or  edema is identified. No motor or sensory levels are noted. Crude visual fields are within normal range.  RADIOLOGY RESULTS: Mammograms reviewed compatible with above-stated findings  PLAN: Present time patient is doing well 1 year out with no evidence of disease on pleased with her overall progress.  She continues on Arimidex without side effect.  Will see her 1 more time in a year and then discontinue follow-up care.  Patient knows to call with any concerns at any time she continues close follow-up care with medical oncology.  I would like to take this opportunity to thank you for allowing me to participate in the care of your patient.Carmina Miller, MD

## 2022-10-09 IMAGING — MG MM BREAST LOCALIZATION CLIP
4 series · 4 of 12 positions shown · non-contrast
Comparison: Previous exam(s).

CLINICAL DATA: Post ultrasound-guided core needle biopsy of left
breast 12:30 o'clock breast mass.

EXAM:
3D DIAGNOSTIC LEFT MAMMOGRAM POST ULTRASOUND BIOPSY

[L ML synth-2D]
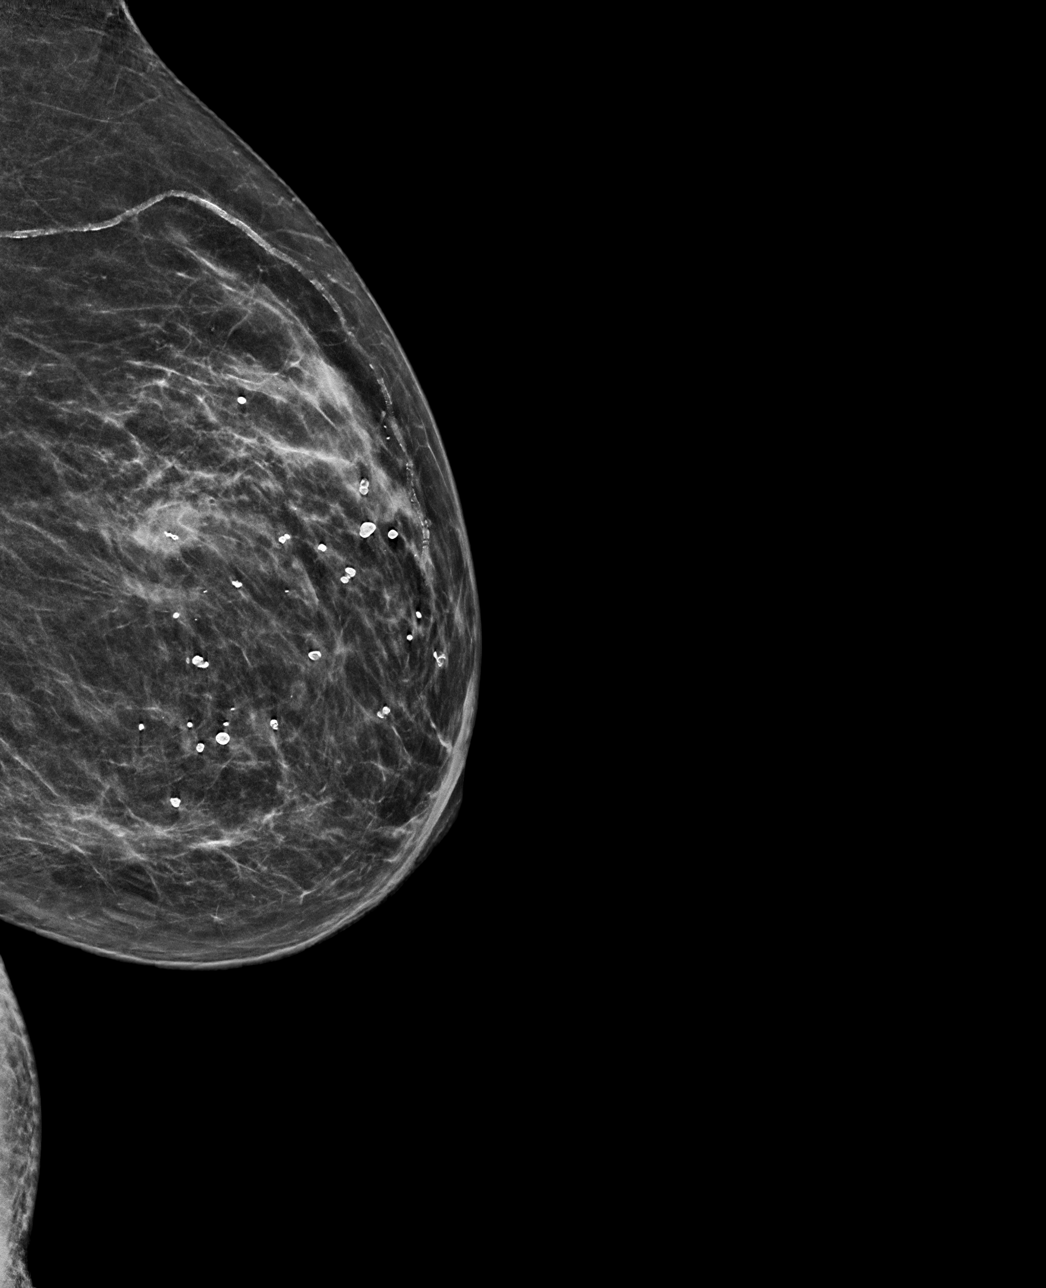

[L CC synth-2D]
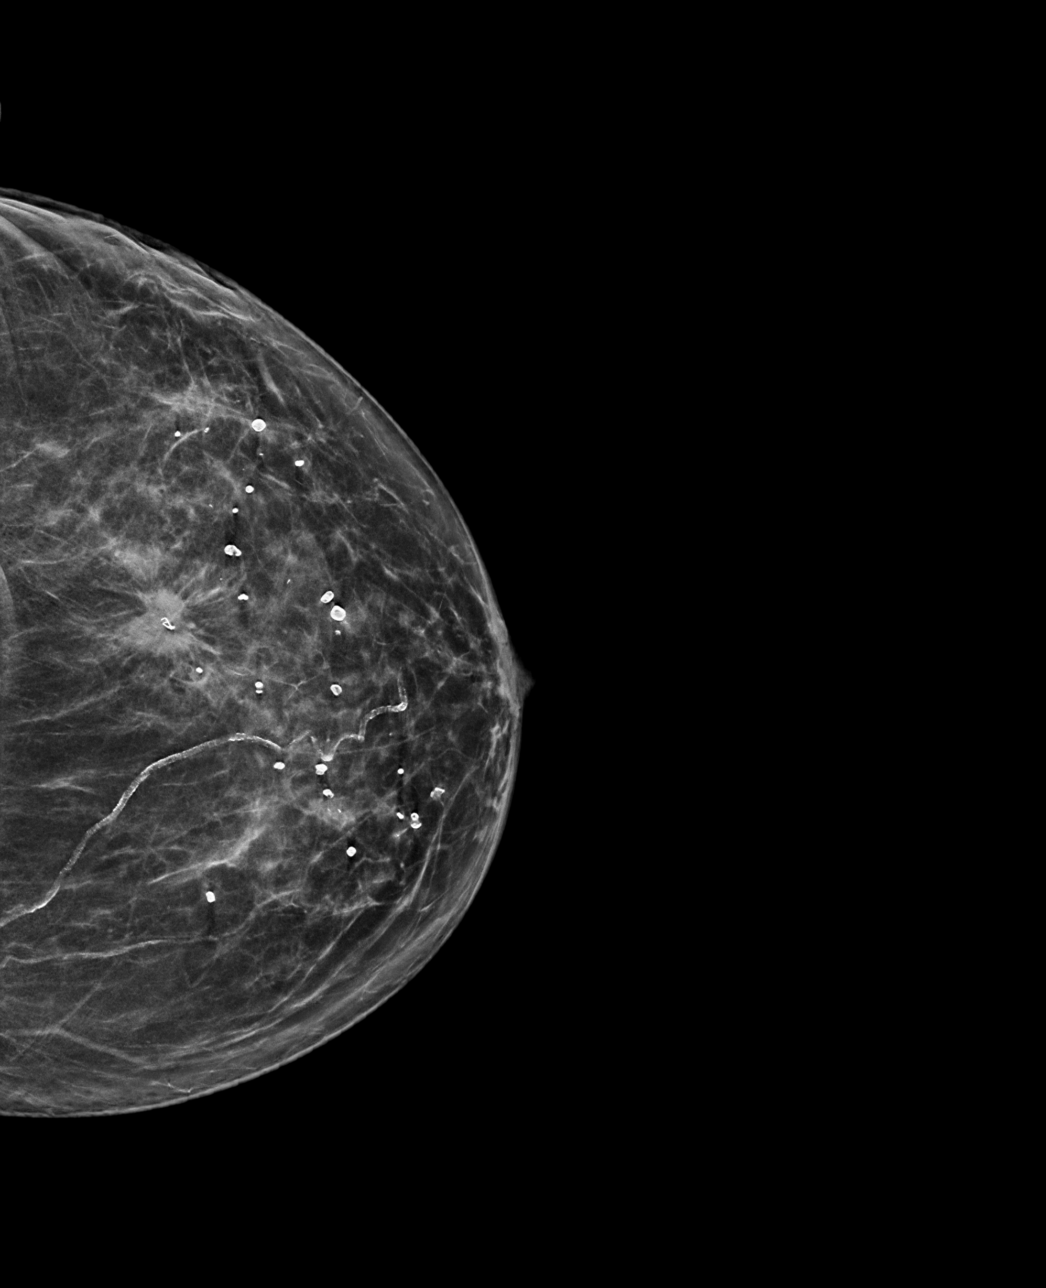

[L ML tomo · tomo slice 31/60.0]
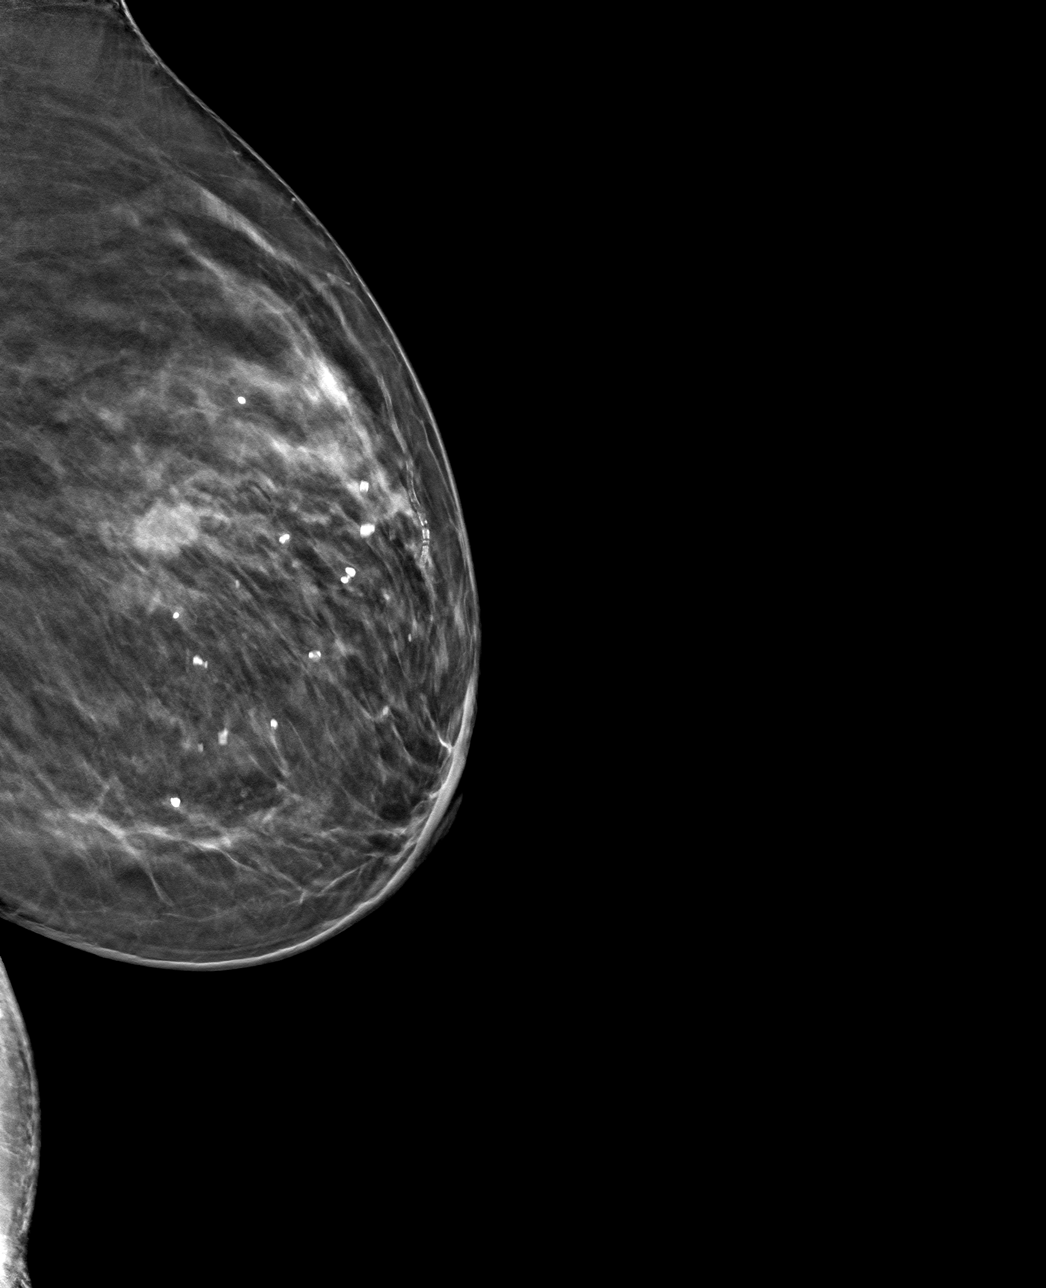

[L CC tomo · tomo slice 31/62.0]
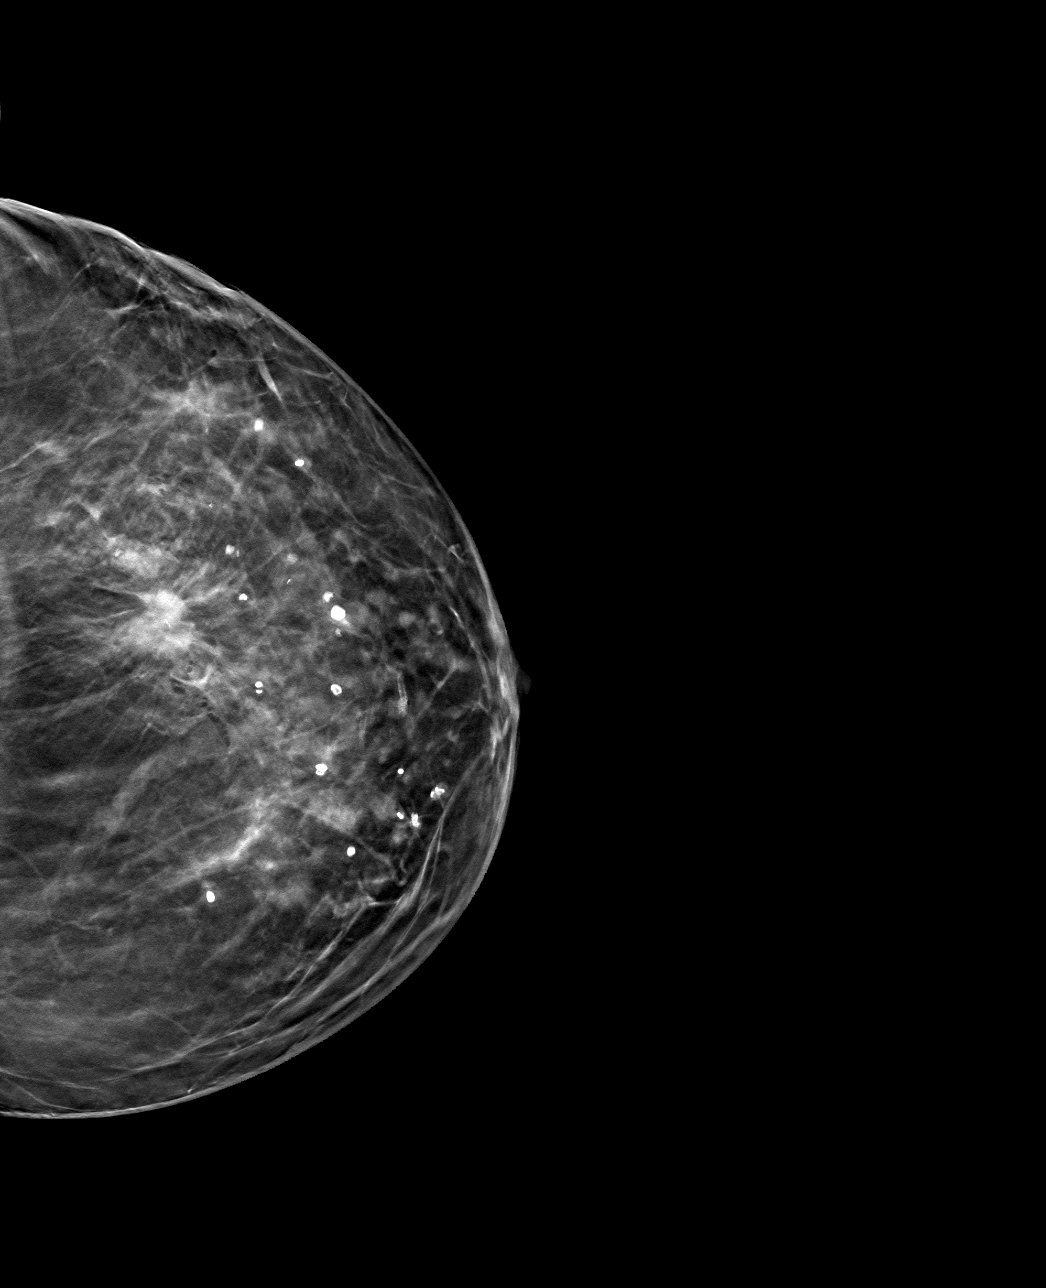

[4 of 12 positions shown; findings below may reference images not displayed]

FINDINGS: 3D Mammographic images were obtained following ultrasound guided
biopsy of left breast 12:30 o'clock mass. The biopsy marking clip is
in expected position at the site of biopsy.
IMPRESSION: Appropriate positioning of the venous shaped biopsy marking clip at
the site of biopsy in the mass of question.

Final Assessment: Post Procedure Mammograms for Marker Placement

## 2022-10-25 ENCOUNTER — Emergency Department
Admission: EM | Admit: 2022-10-25 | Discharge: 2022-10-25 | Disposition: A | Discharge status: Home / Self Care | Payer: 59 | Attending: Emergency Medicine | Admitting: Emergency Medicine

## 2022-10-25 ENCOUNTER — Other Ambulatory Visit: Payer: Self-pay

## 2022-10-25 DIAGNOSIS — T50901A Poisoning by unspecified drugs, medicaments and biological substances, accidental (unintentional), initial encounter: Secondary | ICD-10-CM | POA: Diagnosis present

## 2022-10-25 DIAGNOSIS — Z91148 Patient's other noncompliance with medication regimen for other reason: Secondary | ICD-10-CM | POA: Insufficient documentation

## 2022-10-25 NOTE — ED Triage Notes (Addendum)
Pt brought in by caregiver from [facility] today to be evaluated after she accidentally took another patient's medications tonight at 7pm. She answered to the wrong name. Pt reports she feels fine; has no complaints. The patient took the following medications and she does not normally take these:  10mg  Atorvastatin 300mg  Lamotrigine 1000mg  Levetiracetam 200mg  Zonisamide

## 2022-10-25 NOTE — Discharge Instructions (Signed)
Please return to the ER right away if you begin to experience confusion, difficulty with coordination, passout, headache, weakness, chest pain, or other concerns or symptoms arise  You may resume your normal medications tomorrow morning.

## 2022-10-25 NOTE — ED Provider Notes (Signed)
[facility] Provider Note    Event Date/Time   First MD Initiated Contact with Patient 10/25/22 2126     (approximate)   History   Medication Error   HPI {Remember to add pertinent medical, surgical, social, and/or OB history to HPI:1} Tamara Mejia is a 72 y.o. female  ***       Physical Exam   Triage Vital Signs: ED Triage Vitals  Encounter Vitals Group     BP 10/25/22 2008 (!) 164/72     Systolic BP Percentile --      Diastolic BP Percentile --      Pulse Rate 10/25/22 2008 86     Resp 10/25/22 2008 18     Temp 10/25/22 2008 98.7 F (37.1 C)     Temp Source 10/25/22 2008 Oral     SpO2 10/25/22 2008 95 %     Weight 10/25/22 2012 182 lb (82.6 kg)     Height 10/25/22 2012 5\' 2"  (1.575 m)     Head Circumference --      Peak Flow --      Pain Score 10/25/22 2012 0     Pain Loc --      Pain Education --      Exclude from Growth Chart --     Most recent vital signs: Vitals:   10/25/22 2008  BP: (!) 164/72  Pulse: 86  Resp: 18  Temp: 98.7 F (37.1 C)  SpO2: 95%    {Only need to document appropriate and relevant physical exam:1} General: Awake, no distress. *** CV:  Good peripheral perfusion. *** Resp:  Normal effort. *** Abd:  No distention. *** Other:  ***   ED Results / Procedures / Treatments   Labs (all labs ordered are listed, but only abnormal results are displayed) Labs Reviewed - No data to display   EKG  ***   RADIOLOGY *** {USE THE WORD "INTERPRETED"!! You MUST document your own interpretation of imaging, as well as the fact that you reviewed the radiologist's report!:1}   PROCEDURES:  Critical Care performed: {CriticalCareYesNo:19197::"Yes, see critical care procedure note(s)","No"}  Procedures   MEDICATIONS ORDERED IN ED: Medications - No data to display   IMPRESSION / MDM / ASSESSMENT AND PLAN / ED COURSE  I reviewed the triage vital signs and the nursing notes.                               Differential diagnosis includes, but is not limited to, ***  Patient's presentation is most consistent with {EM COPA:27473}  *** {If the patient is on the monitor, remove the brackets and asterisks on the sentence below and remember to document it as a Procedure as well. Otherwise delete the sentence below:1} {**The patient is on the cardiac monitor to evaluate for evidence of arrhythmia and/or significant heart rate changes.**} {Remember to include, when applicable, any/all of the following data: independent review of imaging independent review of labs (comment specifically on pertinent positives and negatives) review of specific prior hospitalizations, PCP/specialist notes, etc. discuss meds given and prescribed document any discussion with consultants (including hospitalists) any clinical decision tools you used and why (PECARN, NEXUS, etc.) did you consider admitting the patient? document social determinants of health affecting patient's care (homelessness, inability to follow up in a timely fashion, etc) document any pre-existing conditions increasing risk on current visit (e.g. diabetes and HTN increasing danger of high-risk chest  pain/ACS) describes what meds you gave (especially parenteral) and why any other interventions?:1}     FINAL CLINICAL IMPRESSION(S) / ED DIAGNOSES   Final diagnoses:  Accidental medication error, initial encounter     Rx / DC Orders   ED Discharge Orders     None        Note:  This document was prepared using Dragon voice recognition software and may include unintentional dictation errors.

## 2022-10-25 NOTE — ED Notes (Signed)
Pt d/c home with caregiver per EDP order. Discharge summary reviewed, pt verbalizes understanding. NAD noted at discharge. ambulatory

## 2022-10-25 NOTE — ED Notes (Signed)
This RN has called Poison Control to notify them about this patient. No toxic doses were given and recommends monitoring her for 4 hours but side effects could potentially be nausea and drowsiness. If she continues to be asymptomatic it will be okay to administer her regularly scheduled medications. Being 2 hours have passed since ingestion, 2 more hours to be monitored here at the hospital. Per Altamease Oiler, RN with Poison Control.

## 2022-10-28 DIAGNOSIS — G252 Other specified forms of tremor: Secondary | ICD-10-CM | POA: Insufficient documentation

## 2022-11-18 ENCOUNTER — Other Ambulatory Visit: Payer: Self-pay | Admitting: Oncology

## 2022-11-24 ENCOUNTER — Encounter: Payer: Self-pay | Admitting: Oncology

## 2022-11-24 ENCOUNTER — Inpatient Hospital Stay: Payer: 59 | Attending: Oncology

## 2022-11-24 ENCOUNTER — Inpatient Hospital Stay: Payer: 59 | Admitting: Oncology

## 2022-11-24 NOTE — Assessment & Plan Note (Deleted)
#   Stage IB left breast cancer, ER/PR+, HER2 - pT1c pN1a M0 Mammaprint came back high risk, 29% recurrence risk in 10 years.  S/p 2 cycles of docetaxel/Cytoxan, did not complete total 4 planned treatment due to complications.  S/p post adjuvant radiation.  Reviewed results with patient, and facility staff.. Continue Arimidex 1 mg daily.  Refill sent. Continue annual bilateral diagnostic mammogram-next due in December 2024.

## 2023-01-01 IMAGING — DX DG CHEST 1V PORT
1 series · 1 of 1 positions shown · non-contrast
Comparison: 12/22/2011

CLINICAL DATA: Port placement

EXAM:
PORTABLE CHEST 1 VIEW

[chest ap]
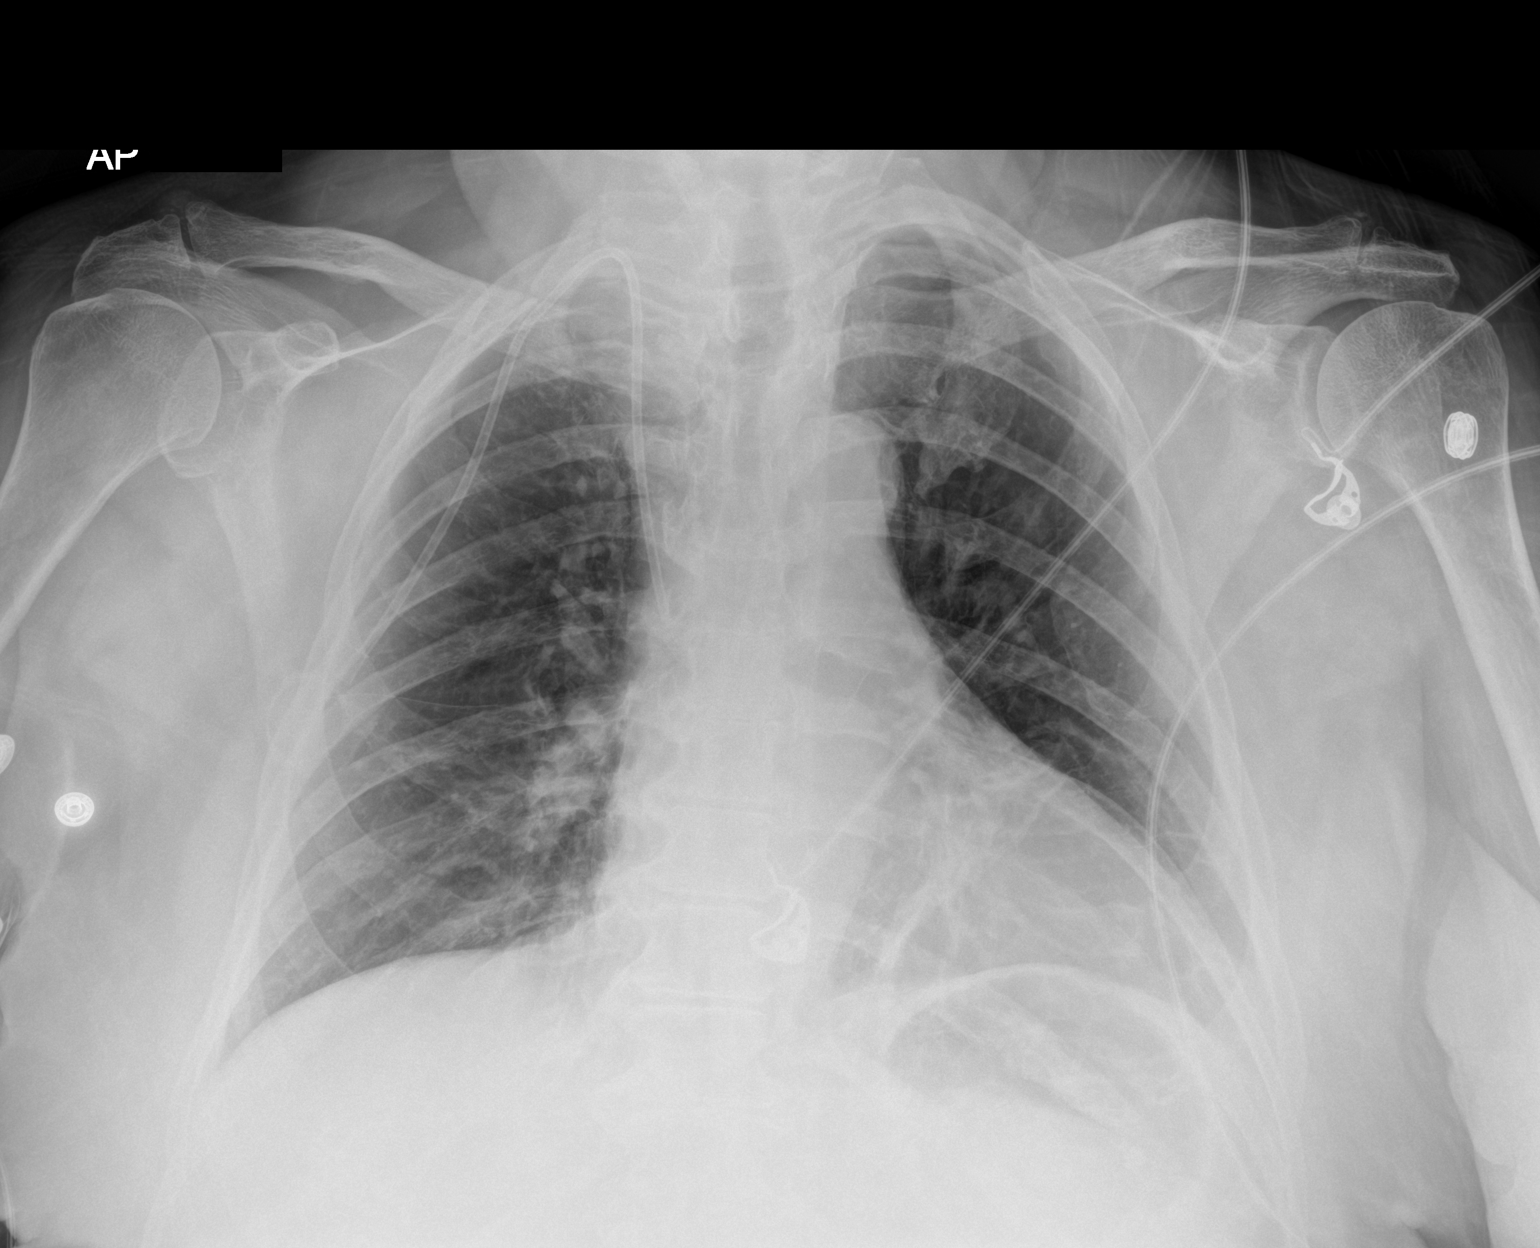

[1 of 1 positions shown; findings below may reference images not displayed]

FINDINGS: Right-sided central venous port with tip overlying SVC. No
pneumothorax. Mild cardiomegaly. Streaky atelectasis or scar at the
left lung base.
IMPRESSION: 1. Right-sided central venous port tip over the SVC. No visible
pneumothorax
2. Cardiomegaly with streaky atelectasis or scar at the left base

## 2023-01-07 ENCOUNTER — Ambulatory Visit: Payer: 59 | Admitting: Adult Health

## 2023-01-21 NOTE — Assessment & Plan Note (Addendum)
#   Stage IB left breast cancer, ER/PR+, HER2 - pT1c pN1a M0 Mammaprint came back high risk, 29% recurrence risk in 10 years.  S/p 2 cycles of docetaxel /Cytoxan , status post adjuvant radiation.  Reviewed results with patient, and facility staff.. Continue Arimidex  1 mg daily.  Refill sent. Continue annual bilateral diagnostic mammogram- due now

## 2023-01-22 ENCOUNTER — Inpatient Hospital Stay (HOSPITAL_BASED_OUTPATIENT_CLINIC_OR_DEPARTMENT_OTHER): Payer: 59 | Admitting: Oncology

## 2023-01-22 ENCOUNTER — Inpatient Hospital Stay: Payer: 59 | Attending: Oncology

## 2023-01-22 ENCOUNTER — Encounter: Payer: Self-pay | Admitting: Oncology

## 2023-01-22 VITALS — BP 131/70 | HR 76 | Temp 98.2°F | Resp 18 | Wt 187.4 lb

## 2023-01-22 DIAGNOSIS — Z79811 Long term (current) use of aromatase inhibitors: Secondary | ICD-10-CM

## 2023-01-22 DIAGNOSIS — Z170 Estrogen receptor positive status [ER+]: Secondary | ICD-10-CM

## 2023-01-22 DIAGNOSIS — Z803 Family history of malignant neoplasm of breast: Secondary | ICD-10-CM | POA: Diagnosis not present

## 2023-01-22 DIAGNOSIS — Z1721 Progesterone receptor positive status: Secondary | ICD-10-CM | POA: Diagnosis not present

## 2023-01-22 DIAGNOSIS — C50912 Malignant neoplasm of unspecified site of left female breast: Secondary | ICD-10-CM

## 2023-01-22 DIAGNOSIS — Z90710 Acquired absence of both cervix and uterus: Secondary | ICD-10-CM | POA: Insufficient documentation

## 2023-01-22 DIAGNOSIS — Z9221 Personal history of antineoplastic chemotherapy: Secondary | ICD-10-CM | POA: Diagnosis not present

## 2023-01-22 DIAGNOSIS — Z923 Personal history of irradiation: Secondary | ICD-10-CM | POA: Insufficient documentation

## 2023-01-22 DIAGNOSIS — Z1732 Human epidermal growth factor receptor 2 negative status: Secondary | ICD-10-CM | POA: Diagnosis not present

## 2023-01-22 LAB — CMP (CANCER CENTER ONLY)
ALT: 27 U/L (ref 0–44)
AST: 28 U/L (ref 15–41)
Albumin: 4.3 g/dL (ref 3.5–5.0)
Alkaline Phosphatase: 56 U/L (ref 38–126)
Anion gap: 11 (ref 5–15)
BUN: 17 mg/dL (ref 8–23)
CO2: 26 mmol/L (ref 22–32)
Calcium: 9.5 mg/dL (ref 8.9–10.3)
Chloride: 97 mmol/L — ABNORMAL LOW (ref 98–111)
Creatinine: 0.75 mg/dL (ref 0.44–1.00)
GFR, Estimated: >60 mL/min (ref >60)
Glucose, Bld: 102 mg/dL — ABNORMAL HIGH (ref 70–99)
Potassium: 3.7 mmol/L (ref 3.5–5.1)
Sodium: 134 mmol/L — ABNORMAL LOW (ref 135–145)
Total Bilirubin: 0.3 mg/dL (ref 0.0–1.2)
Total Protein: 7.1 g/dL (ref 6.5–8.1)

## 2023-01-22 LAB — CBC WITH DIFFERENTIAL (CANCER CENTER ONLY)
Abs Immature Granulocytes: 0.02 10*3/uL (ref 0.00–0.07)
Basophils Absolute: 0.1 10*3/uL (ref 0.0–0.1)
Basophils Relative: 1 %
Eosinophils Absolute: 0.2 10*3/uL (ref 0.0–0.5)
Eosinophils Relative: 3 %
HCT: 38.9 % (ref 36.0–46.0)
Hemoglobin: 13 g/dL (ref 12.0–15.0)
Immature Granulocytes: 0 %
Lymphocytes Relative: 27 %
Lymphs Abs: 1.2 10*3/uL (ref 0.7–4.0)
MCH: 30.8 pg (ref 26.0–34.0)
MCHC: 33.4 g/dL (ref 30.0–36.0)
MCV: 92.2 fL (ref 80.0–100.0)
Monocytes Absolute: 0.4 10*3/uL (ref 0.1–1.0)
Monocytes Relative: 9 %
Neutro Abs: 2.6 10*3/uL (ref 1.7–7.7)
Neutrophils Relative %: 60 %
Platelet Count: 208 10*3/uL (ref 150–400)
RBC: 4.22 MIL/uL (ref 3.87–5.11)
RDW: 12.6 % (ref 11.5–15.5)
WBC Count: 4.5 10*3/uL (ref 4.0–10.5)
nRBC: 0 % (ref 0.0–0.2)

## 2023-01-22 NOTE — Progress Notes (Signed)
 Pt here for follow up. No new concerns voiced.

## 2023-01-22 NOTE — Progress Notes (Signed)
 Hematology/Oncology Progress note Telephone:(336) [phone] Fax:(336) [phone]      CHIEF COMPLAINTS/REASON FOR VISIT:  left breast cancer   ASSESSMENT & PLAN:   Cancer Staging  Malignant neoplasm of left breast in female, estrogen receptor positive (HCC) Staging form: Breast, AJCC 8th Edition - Pathologic stage from 12/23/2020: Stage IB (pT1c, pN1a, cM0, G3, ER+, PR+, HER2-) - Signed by Babara Call, MD on 12/23/2020   Malignant neoplasm of left breast in female, estrogen receptor positive (HCC) # Stage IB left breast cancer, ER/PR+, HER2 - pT1c pN1a M0 Mammaprint came back high risk, 29% recurrence risk in 10 years.  S/p 2 cycles of docetaxel /Cytoxan , status post adjuvant radiation.  Reviewed results with patient, and facility staff.. Continue Arimidex  1 mg daily.  Refill sent. Continue annual bilateral diagnostic mammogram- due now     Aromatase inhibitor use normal DEXA 08/20/21  recommend continue calcium  and vitamin D  supplementation.   Orders Placed This Encounter  Procedures   MM 3D DIAGNOSTIC MAMMOGRAM BILATERAL BREAST    Standing Status:   Future    Expected Date:   01/29/2023    Expiration Date:   01/22/2024    Reason for Exam (SYMPTOM  OR DIAGNOSIS REQUIRED):   hx breast cancer    Preferred imaging location?:   [facility]   US  LIMITED ULTRASOUND INCLUDING AXILLA LEFT BREAST     Standing Status:   Future    Expiration Date:   01/22/2024    Reason for Exam (SYMPTOM  OR DIAGNOSIS REQUIRED):   hx breast cancer    Preferred imaging location?:   [facility]   US  LIMITED ULTRASOUND INCLUDING AXILLA RIGHT BREAST    Standing Status:   Future    Expected Date:   01/29/2023    Expiration Date:   01/22/2024    Reason for Exam (SYMPTOM  OR DIAGNOSIS REQUIRED):   hx breast cancer    Preferred imaging location?:   [facility]   CBC with Differential (Cancer Center Only)    Standing Status:   Future    Expected Date:   07/22/2023    Expiration Date:   01/22/2024    CMP (Cancer Center only)    Standing Status:   Future    Expected Date:   07/22/2023    Expiration Date:   01/22/2024   Follow-up in 6 months. All questions were answered. The patient knows to call the clinic with any problems, questions or concerns.  Call Babara, MD, PhD [facility] Hematology Oncology 01/22/2023    HISTORY OF PRESENTING ILLNESS:   Tamara Mejia is a  73 y.o.  female with presents for follow up of left breast cancer.  Oncology History  Malignant neoplasm of left breast in female, estrogen receptor positive (HCC)  09/26/2020 Imaging   unilateral left diagnostic mammogram showed there is a suspicious 17 mm mass in the LEFT upper breast which is concerning for malignancy. Recommend ultrasound-guided biopsy for definitive characterization. No suspicious LEFT axillary adenopathy.     10/28/2020 Initial Diagnosis   10/08/2020 left breast US  guided biopsy showed invasive mammary carcinoma, no special type. Grade 3, no DCIS, no LVI. ER 51-90%, PR 51-90%, HER2 negative.      12/23/2020 Cancer Staging   Staging form: Breast, AJCC 8th Edition - Pathologic stage from 12/23/2020: Stage IB (pT1c, pN1a, cM0, G3, ER+, PR+, HER2-) - Signed by Babara Call, MD on 12/23/2020 Stage prefix: Initial diagnosis Multigene prognostic tests performed: MammaPrint- 29% recurrence risk in 10 years Histologic grading system:  3 grade system    12/28/2020 Genetic Testing   Negative genetic testing. No pathogenic variants identified on the [facility] Common Hereditary Cancers+RNA panel. The report date is 12/28/2020.  The Common Hereditary Cancers Panel + RNA offered by [facility] includes sequencing and/or deletion duplication testing of the following 47 genes: APC, ATM, AXIN2, BARD1, BMPR1A, BRCA1, BRCA2, BRIP1, CDH1, CDKN2A (p14ARF), CDKN2A (p16INK4a), CKD4, CHEK2, CTNNA1, DICER1, EPCAM (Deletion/duplication testing only), GREM1 (promoter region deletion/duplication testing only), KIT, MEN1, MLH1, MSH2, MSH3, MSH6,  MUTYH, NBN, NF1, NHTL1, PALB2, PDGFRA, PMS2, POLD1, POLE, PTEN, RAD50, RAD51C, RAD51D, SDHB, SDHC, SDHD, SMAD4, SMARCA4. STK11, TP53, TSC1, TSC2, and VHL.  The following genes were evaluated for sequence changes only: SDHA and HOXB13 c.251G>A variant only.   01/08/2021 - 01/29/2021 Chemotherapy   TC q21d x2 cycles.  She did not tolerate.    02/14/2021 - 02/28/2021 Hospital Admission    admission due to MSSA bacteremia due to infected medi port.  Medi port was removed on 02/18/2021.  Small abscess seen on MRI of the lumbar spine on 02/22/2021.  Too small to drain  TEE from 02/19/21 NEG She was seen by ID and treated with IV antibiotics. Picc line was placed and patient finished total of 6 weeks of IV antibiotics. PICC line removal.    04/28/2021 - 06/05/2021 Radiation Therapy   status post adjuvant left breast radiation   06/25/2021 -  Anti-estrogen oral therapy   Started on Arimidex  1mg  daily   08/20/2021 Imaging   DEXA shows normal bone density    .   INTERVAL HISTORY Tamara Mejia is a 73 y.o. female who has above history reviewed by me today presents for follow up visit for left breast cancer.  Patient was accompanied by facility staff.  Patient denies any new complaints.  She tolerates Arimidex  well.   Denies body aches, hot flash  Review of Systems  Constitutional:  Negative for appetite change, chills, fatigue and fever.  HENT:   Negative for hearing loss and voice change.   Eyes:  Negative for eye problems.  Respiratory:  Negative for chest tightness and cough.   Cardiovascular:  Negative for chest pain.  Gastrointestinal:  Negative for abdominal distention, abdominal pain, blood in stool and nausea.  Endocrine: Negative for hot flashes.  Genitourinary:  Negative for difficulty urinating and frequency.   Musculoskeletal:  Negative for arthralgias.  Skin:  Negative for itching and rash.  Neurological:  Negative for extremity weakness.  Hematological:  Negative for adenopathy.   Psychiatric/Behavioral:  Negative for confusion.     MEDICAL HISTORY:  Past Medical History:  Diagnosis Date   Breast cancer (HCC)    chemo and radiation   Cognitive dysfunction    mental retardation (mild to moderate)   Diabetes mellitus, type 2 (HCC)    diet controlled   Family history of breast cancer    High cholesterol    History of 2019 novel coronavirus disease (COVID-19) 11/04/2020   a.) resides in group home; others were sick with SARS-Cov-2. Home test (+).   Hypertension    Meningitis    in childhood   Obesity (BMI 30-39.9)    Overactive bladder    Seizure (HCC)    last seizure 2000    SURGICAL HISTORY: Past Surgical History:  Procedure Laterality Date   ABDOMINAL HYSTERECTOMY     as a teenager   BREAST BIOPSY Right    neg   BREAST BIOPSY Left 10/18/2020   u/s breast  biopsy, venus clip-INVASIVE  MAMMARY CARCINOMA   BUNIONECTOMY     CHOLECYSTECTOMY     COLONOSCOPY WITH PROPOFOL  N/A 08/27/2015   Procedure: COLONOSCOPY WITH PROPOFOL ;  Surgeon: Rogelia Copping, MD;  Location: [facility] ENDOSCOPY;  Service: Endoscopy;  Laterality: N/A;   PART MASTECTOMY,RADIO FREQUENCY LOCALIZER,AXILLARY SENTINEL NODE BIOPSY Left 11/22/2020   Procedure: PART MASTECTOMY, RADIO FREQUENCY LOCALIZER,AXILLARY SENTINEL NODE BIOPSY;  Surgeon: Rodolph Romano, MD;  Location: [facility] ORS;  Service: General;  Laterality: Left;   PORTA CATH REMOVAL N/A 02/18/2021   Procedure: PORTA CATH REMOVAL;  Surgeon: Jama Cordella MATSU, MD;  Location: [facility] INVASIVE CV LAB;  Service: Cardiovascular;  Laterality: N/A;   PORTACATH PLACEMENT N/A 12/31/2020   Procedure: INSERTION PORT-A-CATH;  Surgeon: Rodolph Romano, MD;  Location: [facility] ORS;  Service: General;  Laterality: N/A;   TEE WITHOUT CARDIOVERSION N/A 02/19/2021   Procedure: TRANSESOPHAGEAL ECHOCARDIOGRAM (TEE);  Surgeon: Hester Wolm PARAS, MD;  Location: [facility] ORS;  Service: Cardiovascular;  Laterality: N/A;    SOCIAL HISTORY: Social History    Socioeconomic History   Marital status: Single    Spouse name: Not on file   Number of children: Not on file   Years of education: Not on file   Highest education level: Not on file  Occupational History   Not on file  Tobacco Use   Smoking status: Never   Smokeless tobacco: Never  Substance and Sexual Activity   Alcohol use: No   Drug use: No   Sexual activity: Not on file  Other Topics Concern   Not on file  Social History Narrative   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    Social Drivers of Health   Financial Resource Strain: Patient Unable To Answer (10/28/2022)   Received from [facility]   Overall Financial Resource Strain (CARDIA)    Difficulty of Paying Living Expenses: Patient unable to answer  Food Insecurity: Patient Unable To Answer (10/28/2022)   Received from [facility]   Hunger Vital Sign    Worried About Running Out of Food in the Last Year: Patient unable to answer    Ran Out of Food in the Last Year: Patient unable to answer  Transportation Needs: Patient Unable To Answer (10/28/2022)   Received from [facility] - Transportation    In the past 12 months, has lack of transportation kept you from medical appointments or from getting medications?: Patient unable to answer    Lack of Transportation (Non-Medical): Patient unable to answer  Physical Activity: Not on file  Stress: Not on file  Social Connections: Not on file  Intimate Partner Violence: Not on file    FAMILY HISTORY: Family History  Problem Relation Age of Onset   Diabetes Father    Heart disease Father    Heart disease Brother    Breast cancer Cousin        dx 70s    ALLERGIES:  has no known  allergies.  MEDICATIONS:  Current Outpatient Medications  Medication Sig Dispense Refill   amLODipine  (NORVASC ) 5 MG tablet Take 5 mg by mouth in the morning. (Patient not taking: Reported on 05/21/2022)     anastrozole  (ARIMIDEX ) 1 MG tablet TAKE 1 TABLET BY MOUTH ONCE DAILY  *  HAZARDOUS DRUG: WEAR GLOVES* *DO NOT CRUSH* 7 tablet 10   aspirin  81 MG EC tablet Take 81 mg by mouth in the morning. Swallow whole.     calcium  carbonate (OSCAL) 1500 (600 Ca) MG TABS tablet TAKE 1 TABLET BY MOUTH TWICE DAILY WITH MEALS 14 tablet 10   carbamazepine  (TEGRETOL ) 200 MG tablet TAKE ONE TABLET BY MOUTH THREE TIMES DAILY. (SEIZURE CONTROL) (Patient not taking: Reported on 05/21/2022) 90 tablet 11   Cholecalciferol  (VITAMIN D ) 50 MCG (2000 UT) tablet Take 4,000 Units by mouth in the morning.     citalopram  (CELEXA ) 10 MG tablet Take 1 tablet by mouth daily.     hydrochlorothiazide  (HYDRODIURIL ) 25 MG tablet Take 25 mg by mouth daily.     lisinopril  (ZESTRIL ) 20 MG tablet Take 20 mg by mouth daily.     loratadine  (CLARITIN ) 10 MG tablet Take 1 tablet (10 mg total) by mouth daily. Take Claritin  daily for 4 days, Day1-4 of each chemo cycle. (Patient not taking: Reported on 01/08/2022) 16 tablet 0   metoprolol  succinate (TOPROL -XL) 50 MG 24 hr tablet Take 1 tablet (50 mg total) by mouth at bedtime. Take with or immediately following a meal. 30 tablet 0   metoprolol  succinate (TOPROL -XL) 50 MG 24 hr tablet TAKE 1 TABLET BY MOUTH EVERY NIGHT AT BEDTIME *DO NOT CRUSH OR CHEW* (Patient not taking: Reported on 05/21/2022)     rosuvastatin (CRESTOR) 20 MG tablet Take 20 mg by mouth at bedtime.     No current facility-administered medications for this visit.     PHYSICAL EXAMINATION: ECOG PERFORMANCE STATUS: 1 - Symptomatic but completely ambulatory Vitals:   01/22/23 1139  BP: 131/70  Pulse: 76  Resp: 18  Temp: 98.2 F (36.8 C)   Filed Weights   01/22/23 1139  Weight: 187 lb 6.4 oz (85 kg)    Physical  Exam Constitutional:      General: She is not in acute distress.    Appearance: She is obese.  HENT:     Head: Normocephalic and atraumatic.  Eyes:     General: No scleral icterus. Cardiovascular:     Rate and Rhythm: Normal rate and regular rhythm.  Pulmonary:     Effort: Pulmonary effort is normal. No respiratory distress.     Breath sounds: No wheezing.  Abdominal:     General: Bowel sounds are normal. There is no distension.     Palpations: Abdomen is soft.  Musculoskeletal:        General: No deformity. Normal range of motion.     Cervical back: Normal range of motion and neck supple.  Skin:    General: Skin is warm and dry.     Findings: No erythema or rash.  Neurological:     Mental Status: She is alert and oriented to person, place, and time. Mental status is at baseline.     Cranial Nerves: No cranial nerve deficit.     Coordination: Coordination normal.  Psychiatric:        Mood and Affect: Mood normal.       LABORATORY DATA:  I have reviewed the data as listed     Latest Ref Rng & Units 01/22/2023   11:23 AM 05/21/2022   10:14 AM 10/22/2021    1:35 PM  CBC  WBC 4.0 - 10.5 K/uL 4.5  4.5  4.4   Hemoglobin 12.0 - 15.0 g/dL 86.9  87.7  87.6   Hematocrit 36.0 - 46.0 % 38.9  36.6  37.0   Platelets 150 - 400 K/uL 208  198  195       Latest Ref Rng & Units 01/22/2023   11:23 AM 05/21/2022   10:14 AM 10/22/2021    1:35 PM  CMP  Glucose 70 - 99 mg/dL 897  83  833   BUN 8 - 23 mg/dL 17  22  13    Creatinine 0.44 - 1.00 mg/dL 9.24  9.20  9.38   Sodium 135 - 145 mmol/L 134  139  137   Potassium 3.5 - 5.1 mmol/L 3.7  3.7  3.6   Chloride 98 - 111 mmol/L 97  100  100   CO2 22 - 32 mmol/L 26  28  30    Calcium  8.9 - 10.3 mg/dL 9.5  9.3  9.2   Total Protein 6.5 - 8.1 g/dL 7.1  7.3  7.0   Total Bilirubin 0.0 - 1.2 mg/dL 0.3  0.3  0.3   Alkaline Phos 38 - 126 U/L 56  65  61   AST 15 - 41 U/L 28  29  30    ALT 0 - 44 U/L 27  27  30         RADIOGRAPHIC STUDIES: I have  personally reviewed the radiological images as listed and agreed with the findings in the report. No results found.

## 2023-01-22 NOTE — Assessment & Plan Note (Signed)
normal DEXA 08/20/21  recommend continue calcium and vitamin D supplementation. 

## 2023-02-25 ENCOUNTER — Ambulatory Visit
Admission: RE | Admit: 2023-02-25 | Discharge: 2023-02-25 | Disposition: A | Discharge status: Home / Self Care | Payer: 59 | Source: Ambulatory Visit | Attending: Oncology | Admitting: Oncology

## 2023-02-25 DIAGNOSIS — Z1239 Encounter for other screening for malignant neoplasm of breast: Secondary | ICD-10-CM | POA: Diagnosis not present

## 2023-02-25 DIAGNOSIS — R92323 Mammographic fibroglandular density, bilateral breasts: Secondary | ICD-10-CM | POA: Insufficient documentation

## 2023-02-25 DIAGNOSIS — C50912 Malignant neoplasm of unspecified site of left female breast: Secondary | ICD-10-CM | POA: Insufficient documentation

## 2023-02-25 DIAGNOSIS — Z170 Estrogen receptor positive status [ER+]: Secondary | ICD-10-CM | POA: Insufficient documentation

## 2023-02-25 HISTORY — DX: Personal history of antineoplastic chemotherapy: Z92.21

## 2023-02-25 HISTORY — DX: Personal history of irradiation: Z92.3

## 2023-06-16 ENCOUNTER — Other Ambulatory Visit: Payer: Self-pay | Admitting: Oncology

## 2023-07-01 ENCOUNTER — Ambulatory Visit: Payer: 59 | Attending: Radiation Oncology | Admitting: Radiation Oncology

## 2023-07-22 ENCOUNTER — Inpatient Hospital Stay: Payer: 59 | Attending: Internal Medicine

## 2023-07-22 ENCOUNTER — Encounter: Payer: Self-pay | Admitting: Oncology

## 2023-07-22 ENCOUNTER — Inpatient Hospital Stay: Payer: 59 | Admitting: Oncology

## 2023-07-22 NOTE — Assessment & Plan Note (Deleted)
#   Stage IB left breast cancer, ER/PR+, HER2 - pT1c pN1a M0 Mammaprint came back high risk, 29% recurrence risk in 10 years.  S/p 2 cycles of docetaxel /Cytoxan , status post adjuvant radiation.  Reviewed results with patient, and facility staff.. Continue Arimidex  1 mg daily.  Refill sent. Continue annual bilateral diagnostic mammogram- due now

## 2023-08-12 ENCOUNTER — Ambulatory Visit: Payer: Medicare Other | Admitting: Adult Health

## 2023-08-31 ENCOUNTER — Other Ambulatory Visit: Payer: Self-pay | Admitting: Oncology

## 2023-10-08 ENCOUNTER — Telehealth: Payer: Self-pay | Admitting: Oncology

## 2023-10-08 NOTE — Telephone Encounter (Signed)
 Appts from July 2025 that pt no showed have been rescheduled with [facility] to the next available on October 21. 2025.

## 2023-11-09 ENCOUNTER — Inpatient Hospital Stay: Admitting: Oncology

## 2023-11-09 ENCOUNTER — Encounter: Payer: Self-pay | Admitting: Oncology

## 2023-11-09 ENCOUNTER — Inpatient Hospital Stay: Attending: Oncology

## 2023-11-09 VITALS — BP 124/58 | HR 82 | Temp 98.8°F | Resp 18 | Wt 191.0 lb

## 2023-11-09 DIAGNOSIS — Z923 Personal history of irradiation: Secondary | ICD-10-CM | POA: Insufficient documentation

## 2023-11-09 DIAGNOSIS — Z170 Estrogen receptor positive status [ER+]: Secondary | ICD-10-CM | POA: Diagnosis not present

## 2023-11-09 DIAGNOSIS — Z9221 Personal history of antineoplastic chemotherapy: Secondary | ICD-10-CM | POA: Insufficient documentation

## 2023-11-09 DIAGNOSIS — Z1721 Progesterone receptor positive status: Secondary | ICD-10-CM | POA: Insufficient documentation

## 2023-11-09 DIAGNOSIS — Z79811 Long term (current) use of aromatase inhibitors: Secondary | ICD-10-CM

## 2023-11-09 DIAGNOSIS — Z1732 Human epidermal growth factor receptor 2 negative status: Secondary | ICD-10-CM | POA: Diagnosis not present

## 2023-11-09 DIAGNOSIS — Z803 Family history of malignant neoplasm of breast: Secondary | ICD-10-CM | POA: Diagnosis not present

## 2023-11-09 DIAGNOSIS — C50912 Malignant neoplasm of unspecified site of left female breast: Secondary | ICD-10-CM | POA: Insufficient documentation

## 2023-11-09 LAB — CMP (CANCER CENTER ONLY)
ALT: 32 U/L (ref 0–44)
AST: 37 U/L (ref 15–41)
Albumin: 4 g/dL (ref 3.5–5.0)
Alkaline Phosphatase: 56 U/L (ref 38–126)
Anion gap: 12 (ref 5–15)
BUN: 14 mg/dL (ref 8–23)
CO2: 26 mmol/L (ref 22–32)
Calcium: 9.1 mg/dL (ref 8.9–10.3)
Chloride: 97 mmol/L — ABNORMAL LOW (ref 98–111)
Creatinine: 0.71 mg/dL (ref 0.44–1.00)
GFR, Estimated: >60 mL/min (ref >60)
Glucose, Bld: 165 mg/dL — ABNORMAL HIGH (ref 70–99)
Potassium: 3.7 mmol/L (ref 3.5–5.1)
Sodium: 135 mmol/L (ref 135–145)
Total Bilirubin: 0.5 mg/dL (ref 0.0–1.2)
Total Protein: 6.8 g/dL (ref 6.5–8.1)

## 2023-11-09 LAB — CBC WITH DIFFERENTIAL (CANCER CENTER ONLY)
Abs Immature Granulocytes: 0.02 K/uL (ref 0.00–0.07)
Basophils Absolute: 0.1 K/uL (ref 0.0–0.1)
Basophils Relative: 1 %
Eosinophils Absolute: 0.1 K/uL (ref 0.0–0.5)
Eosinophils Relative: 2 %
HCT: 36.8 % (ref 36.0–46.0)
Hemoglobin: 12.6 g/dL (ref 12.0–15.0)
Immature Granulocytes: 1 %
Lymphocytes Relative: 26 %
Lymphs Abs: 1.2 K/uL (ref 0.7–4.0)
MCH: 31.1 pg (ref 26.0–34.0)
MCHC: 34.2 g/dL (ref 30.0–36.0)
MCV: 90.9 fL (ref 80.0–100.0)
Monocytes Absolute: 0.5 K/uL (ref 0.1–1.0)
Monocytes Relative: 11 %
Neutro Abs: 2.6 K/uL (ref 1.7–7.7)
Neutrophils Relative %: 59 %
Platelet Count: 204 K/uL (ref 150–400)
RBC: 4.05 MIL/uL (ref 3.87–5.11)
RDW: 12.2 % (ref 11.5–15.5)
WBC Count: 4.4 K/uL (ref 4.0–10.5)
nRBC: 0 % (ref 0.0–0.2)

## 2023-11-09 MED ORDER — ANASTROZOLE 1 MG PO TABS: 1.0000 mg | ORAL_TABLET | Freq: Every day | ORAL | 1 refills | Status: AC

## 2023-11-09 NOTE — Assessment & Plan Note (Addendum)
#   Stage IB left breast cancer, ER/PR+, HER2 - pT1c pN1a M0 Mammaprint came back high risk, 29% recurrence risk in 10 years.  S/p 2 cycles of docetaxel /Cytoxan , status post adjuvant radiation.  Reviewed results with patient, and facility staff.. Continue Arimidex  1 mg daily [ since 06/2021] .  Refill sent. Continue annual bilateral diagnostic mammogram- Feb 2026

## 2023-11-09 NOTE — Progress Notes (Signed)
 Hematology/Oncology Progress note [phone] [phone]      CHIEF COMPLAINTS/REASON FOR VISIT:  left breast cancer   Tamara Mejia:   Cancer Staging  Malignant neoplasm of left breast in female, estrogen receptor positive (HCC) Staging form: Breast, AJCC 8th Edition - Pathologic stage from 12/23/2020: Stage IB (pT1c, pN1a, cM0, G3, ER+, PR+, HER2-) - Signed by Babara Call, MD on 12/23/2020   Malignant neoplasm of left breast in female, estrogen receptor positive (HCC) # Stage IB left breast cancer, ER/PR+, HER2 - pT1c pN1a M0 Mammaprint came back high risk, 29% recurrence risk in 10 years.  S/p 2 cycles of docetaxel /Cytoxan , status post adjuvant radiation.  Reviewed results with patient, and facility staff.. Continue Arimidex  1 mg daily.  Refill sent. Continue annual bilateral diagnostic mammogram- Feb 2026    Aromatase inhibitor use normal DEXA 08/20/21, repeat prior to next visit recommend continue calcium  and vitamin D  supplementation.   Orders Placed This Encounter  Procedures   MM 3D DIAGNOSTIC MAMMOGRAM BILATERAL BREAST    Standing Status:   Future    Expected Date:   03/11/2024    Expiration Date:   11/08/2024    Reason for Exam (SYMPTOM  OR DIAGNOSIS REQUIRED):   hx breast cancer    Preferred imaging location?:   [facility]   DG BONE DENSITY (DXA)    Standing Status:   Future    Expiration Date:   11/09/2024    Reason for Exam (SYMPTOM  OR DIAGNOSIS REQUIRED):   AI use, hx breast cancer    Preferred imaging location?:   [facility]   CBC with Differential (Cancer Center Only)    Standing Status:   Standing    Number of Occurrences:   1    Expiration Date:   11/08/2024   CMP (Cancer Center only)    Standing Status:   Standing    Number of Occurrences:   1    Expiration Date:   11/08/2024   Follow-up in 6 months. All questions were answered. The patient knows to call the clinic with any problems, questions or  concerns.  Call Babara, MD, PhD [facility] Hematology Oncology 11/09/2023    HISTORY OF PRESENTING ILLNESS:   Tamara Mejia is a  73 y.o.  female with presents for follow up of left breast cancer.  Oncology History  Malignant neoplasm of left breast in female, estrogen receptor positive (HCC)  09/26/2020 Imaging   unilateral left diagnostic mammogram showed there is a suspicious 17 mm mass in the LEFT upper breast which is concerning for malignancy. Recommend ultrasound-guided biopsy for definitive characterization. No suspicious LEFT axillary adenopathy.     10/28/2020 Initial Diagnosis   10/08/2020 left breast US  guided biopsy showed invasive mammary carcinoma, no special type. Grade 3, no DCIS, no LVI. ER 51-90%, PR 51-90%, HER2 negative.      12/23/2020 Cancer Staging   Staging form: Breast, AJCC 8th Edition - Pathologic stage from 12/23/2020: Stage IB (pT1c, pN1a, cM0, G3, ER+, PR+, HER2-) - Signed by Babara Call, MD on 12/23/2020 Stage prefix: Initial diagnosis Multigene prognostic tests performed: MammaPrint- 29% recurrence risk in 10 years Histologic grading system: 3 grade system    12/28/2020 Genetic Testing   Negative genetic testing. No pathogenic variants identified on the [facility] Common Hereditary Cancers+RNA panel. The report date is 12/28/2020.  The Common Hereditary Cancers Panel + RNA offered by [facility] includes sequencing and/or deletion duplication testing of the following 47 genes: APC, ATM, AXIN2, BARD1,  BMPR1A, BRCA1, BRCA2, BRIP1, CDH1, CDKN2A (p14ARF), CDKN2A (p16INK4a), CKD4, CHEK2, CTNNA1, DICER1, EPCAM (Deletion/duplication testing only), GREM1 (promoter region deletion/duplication testing only), KIT, MEN1, MLH1, MSH2, MSH3, MSH6, MUTYH, NBN, NF1, NHTL1, PALB2, PDGFRA, PMS2, POLD1, POLE, PTEN, RAD50, RAD51C, RAD51D, SDHB, SDHC, SDHD, SMAD4, SMARCA4. STK11, TP53, TSC1, TSC2, and VHL.  The following genes were evaluated for sequence changes only: SDHA and HOXB13 c.251G>A  variant only.   01/08/2021 - 01/29/2021 Chemotherapy   TC q21d x2 cycles.  She did not tolerate.    02/14/2021 - 02/28/2021 Hospital Admission    admission due to MSSA bacteremia due to infected medi port.  Medi port was removed on 02/18/2021.  Small abscess seen on MRI of the lumbar spine on 02/22/2021.  Too small to drain  TEE from 02/19/21 NEG She was seen by ID and treated with IV antibiotics. Picc line was placed and patient finished total of 6 weeks of IV antibiotics. PICC line removal.    04/28/2021 - 06/05/2021 Radiation Therapy   status post adjuvant left breast radiation   06/25/2021 -  Anti-estrogen oral therapy   Started on Arimidex  1mg  daily   08/20/2021 Imaging   DEXA shows normal bone density    .   INTERVAL HISTORY [name] is a 73 y.o. female who has above history reviewed by me today presents for follow up visit for left breast cancer.  Patient was accompanied by facility staff.  Patient denies any new complaints.  She tolerates Arimidex  well.   Denies body aches, hot flash  Review of Systems  Constitutional:  Negative for appetite change, chills, fatigue and fever.  HENT:   Negative for hearing loss and voice change.   Eyes:  Negative for eye problems.  Respiratory:  Negative for chest tightness and cough.   Cardiovascular:  Negative for chest pain.  Gastrointestinal:  Negative for abdominal distention, abdominal pain, blood in stool and nausea.  Endocrine: Negative for hot flashes.  Genitourinary:  Negative for difficulty urinating and frequency.   Musculoskeletal:  Negative for arthralgias.  Skin:  Negative for itching and rash.  Neurological:  Negative for extremity weakness.  Hematological:  Negative for adenopathy.  Psychiatric/Behavioral:  Negative for confusion.     MEDICAL HISTORY:  Past Medical History:  Diagnosis Date   Breast cancer (HCC)    chemo and radiation   Cognitive dysfunction    mental retardation (mild to moderate)   Diabetes  mellitus, type 2 (HCC)    diet controlled   Family history of breast cancer    High cholesterol    History of 2019 novel coronavirus disease (COVID-19) 11/04/2020   a.) resides in group home; others were sick with SARS-Cov-2. Home test (+).   Hypertension    Meningitis    in childhood   Obesity (BMI 30-39.9)    Overactive bladder    Personal history of chemotherapy    Personal history of radiation therapy    Seizure (HCC)    last seizure 2000    SURGICAL HISTORY: Past Surgical History:  Procedure Laterality Date   ABDOMINAL HYSTERECTOMY     as a teenager   BREAST BIOPSY Right    neg   BREAST BIOPSY Left 10/18/2020   u/s breast  biopsy, venus clip-INVASIVE MAMMARY CARCINOMA   BUNIONECTOMY     CHOLECYSTECTOMY     COLONOSCOPY WITH PROPOFOL  N/A 08/27/2015   Procedure: COLONOSCOPY WITH PROPOFOL ;  Surgeon: Rogelia Copping, MD;  Location: [facility];  Service: Endoscopy;  Laterality: N/A;  PART MASTECTOMY,RADIO FREQUENCY LOCALIZER,AXILLARY SENTINEL NODE BIOPSY Left 11/22/2020   Procedure: PART MASTECTOMY, RADIO FREQUENCY LOCALIZER,AXILLARY SENTINEL NODE BIOPSY;  Surgeon: Rodolph Romano, MD;  Location: [facility] ORS;  Service: General;  Laterality: Left;   PORTA CATH REMOVAL N/A 02/18/2021   Procedure: PORTA CATH REMOVAL;  Surgeon: Jama Cordella MATSU, MD;  Location: [facility] INVASIVE [facility];  Service: Cardiovascular;  Laterality: N/A;   PORTACATH PLACEMENT N/A 12/31/2020   Procedure: INSERTION PORT-A-CATH;  Surgeon: Rodolph Romano, MD;  Location: [facility] ORS;  Service: General;  Laterality: N/A;   TEE WITHOUT CARDIOVERSION N/A 02/19/2021   Procedure: TRANSESOPHAGEAL ECHOCARDIOGRAM (TEE);  Surgeon: Hester Wolm PARAS, MD;  Location: [facility] ORS;  Service: Cardiovascular;  Laterality: N/A;    SOCIAL HISTORY: Social History   Socioeconomic History   Marital status: Single    Spouse name: Not on file   Number of children: Not on file   Years of education: Not on file   Highest  education level: Not on file  Occupational History   Not on file  Tobacco Use   Smoking status: Never   Smokeless tobacco: Never  Substance and Sexual Activity   Alcohol use: No   Drug use: No   Sexual activity: Not on file  Other Topics Concern   Not on file  Social History Narrative   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    Social Drivers of Health   Financial Resource Strain: Patient Unable To Answer (10/28/2022)   Received from [facility]   Overall Financial Resource Strain (CARDIA)    Difficulty of Paying Living Expenses: Patient unable to answer  Food Insecurity: Patient Unable To Answer (10/28/2022)   Received from [facility]   Hunger Vital Sign    Within the past 12 months, the food you bought just didn't last and you didn't have money to get more.: Patient unable to answer    Within the past 12 months, you worried that your food would run out before you got the money to buy more.: Patient unable to answer  Transportation Needs: Patient Unable To Answer (10/28/2022)   Received from [facility]   PRAPARE - Transportation    Lack of Transportation (Non-Medical): Patient unable to answer    In the past 12 months, has lack of transportation kept you from medical appointments or from getting medications?: Patient unable to answer  Physical Activity: Not on file  Stress: Not on file  Social Connections: Not on file  Intimate Partner Violence: Not on file    FAMILY HISTORY: Family History  Problem Relation Age of Onset   Diabetes Father    Heart disease Father    Heart disease Brother    Breast cancer Cousin        dx 103s    ALLERGIES:  has no known allergies.  MEDICATIONS:  Current Outpatient  Medications  Medication Sig Dispense Refill   amLODipine  (NORVASC ) 5 MG tablet Take 5 mg by mouth in the morning.     aspirin  81 MG EC tablet Take 81 mg by mouth in the morning. Swallow whole.     calcium  carbonate (OSCAL) 1500 (600 Ca) MG TABS tablet TAKE 1 TABLET BY MOUTH TWICE DAILY WITH MEALS  15 tablet 10   carbamazepine  (TEGRETOL ) 200 MG tablet TAKE ONE TABLET BY MOUTH THREE TIMES DAILY. (SEIZURE CONTROL) 90 tablet 11   Cholecalciferol  (VITAMIN D ) 50 MCG (2000 UT) tablet Take 4,000 Units by mouth in the morning.     citalopram  (CELEXA ) 10 MG tablet Take 1 tablet by mouth daily.     hydrochlorothiazide  (HYDRODIURIL ) 25 MG tablet Take 25 mg by mouth daily.     lisinopril  (ZESTRIL ) 20 MG tablet Take 20 mg by mouth daily.     loperamide  (IMODIUM ) 2 MG capsule Take by mouth.     metFORMIN  (GLUCOPHAGE ) 500 MG tablet Take 500 mg by mouth 2 (two) times daily with a meal.     metoprolol  succinate (TOPROL -XL) 50 MG 24 hr tablet Take 1 tablet (50 mg total) by mouth at bedtime. Take with or immediately following a meal. 30 tablet 0   metoprolol  succinate (TOPROL -XL) 50 MG 24 hr tablet TAKE 1 TABLET BY MOUTH EVERY NIGHT AT BEDTIME *DO NOT CRUSH OR CHEW*     ondansetron  (ZOFRAN -ODT) 4 MG disintegrating tablet Take 4 mg by mouth.     rosuvastatin (CRESTOR) 20 MG tablet Take 20 mg by mouth at bedtime.     SODIUM FLUORIDE 5000 PPM 1.1 % PSTE      anastrozole  (ARIMIDEX ) 1 MG tablet Take 1 tablet (1 mg total) by mouth daily. 90 tablet 1   loratadine  (CLARITIN ) 10 MG tablet Take 1 tablet (10 mg total) by mouth daily. Take Claritin  daily for 4 days, Day1-4 of each chemo cycle. (Patient not taking: Reported on 11/09/2023) 16 tablet 0   No current facility-administered medications for this visit.     PHYSICAL EXAMINATION: ECOG PERFORMANCE STATUS: 1 - Symptomatic but completely ambulatory Vitals:   11/09/23 1257 11/09/23 1259  BP:  (!) 124/58  Pulse:  82  Resp: 18 18  Temp: 98.8 F (37.1 C)   SpO2:   98%   Filed Weights   11/09/23 1257  Weight: 191 lb (86.6 kg)    Physical Exam Constitutional:      General: She is not in acute distress.    Appearance: She is obese.  HENT:     Head: Normocephalic and atraumatic.  Eyes:     General: No scleral icterus. Cardiovascular:     Rate and Rhythm: Normal rate and regular rhythm.  Pulmonary:     Effort: Pulmonary effort is normal. No respiratory distress.     Breath sounds: No wheezing.  Abdominal:     General: Bowel sounds are normal. There is no distension.     Palpations: Abdomen is soft.  Musculoskeletal:        General: No deformity. Normal range of motion.     Cervical back: Normal range of motion and neck supple.  Skin:    General: Skin is warm and dry.     Findings: No erythema or rash.  Neurological:     Mental Status: She is alert and oriented to person, place, and time. Mental status is at baseline.     Cranial Nerves: No cranial nerve deficit.     Coordination: Coordination normal.  Psychiatric:        Mood and Affect: Mood normal.       LABORATORY DATA:  I have reviewed the data as listed     Latest Ref Rng & Units 11/09/2023   12:31 PM 01/22/2023   11:23 AM 05/21/2022   10:14 AM  CBC  WBC 4.0 - 10.5 K/uL 4.4  4.5  4.5   Hemoglobin 12.0 - 15.0 g/dL 87.3  86.9  87.7   Hematocrit 36.0 - 46.0 % 36.8  38.9  36.6   Platelets 150 - 400 K/uL 204  208  198       Latest Ref Rng & Units 11/09/2023   12:31 PM 01/22/2023   11:23 AM 05/21/2022   10:14 AM  CMP  Glucose 70 - 99 mg/dL 834  897  83   BUN 8 - 23 mg/dL 14  17  22    Creatinine 0.44 - 1.00 mg/dL 9.28  9.24  9.20   Sodium 135 - 145 mmol/L 135  134  139   Potassium 3.5 - 5.1 mmol/L 3.7  3.7  3.7   Chloride 98 - 111 mmol/L 97  97  100   CO2 22 - 32 mmol/L 26  26  28    Calcium  8.9 - 10.3 mg/dL 9.1  9.5  9.3   Total Protein 6.5 - 8.1 g/dL 6.8  7.1  7.3   Total Bilirubin 0.0 - 1.2 mg/dL 0.5  0.3  0.3   Alkaline Phos 38 - 126 U/L 56  56  65   AST 15 - 41 U/L  37  28  29   ALT 0 - 44 U/L 32  27  27        RADIOGRAPHIC STUDIES: I have personally reviewed the radiological images as listed and agreed with the findings in the report. No results found.

## 2023-11-09 NOTE — Assessment & Plan Note (Signed)
 normal DEXA 08/20/21, repeat prior to next visit recommend continue calcium  and vitamin D  supplementation.

## 2023-11-17 ENCOUNTER — Telehealth: Payer: Self-pay

## 2023-11-17 NOTE — Telephone Encounter (Signed)
 VM left by Tyson Hover requesting a return call with a recap from pt's last visit in Oct.   Call returned and she was informed of Dr. Layvonne follow up plan per progress note: Continue Arimidex  1mg  daily and calcium / vitamin D  supplement. Dexa and Mammo in Feb. Appt details given to Jeannie

## 2024-02-08 ENCOUNTER — Inpatient Hospital Stay

## 2024-02-08 ENCOUNTER — Inpatient Hospital Stay: Admitting: Oncology

## 2024-02-28 ENCOUNTER — Inpatient Hospital Stay: Admission: RE | Admit: 2024-02-28 | Source: Ambulatory Visit

## 2024-02-28 ENCOUNTER — Other Ambulatory Visit

## 2024-03-06 ENCOUNTER — Inpatient Hospital Stay: Admitting: Oncology

## 2024-03-06 ENCOUNTER — Inpatient Hospital Stay
